# Patient Record
Sex: Female | Born: 1985 | Race: Black or African American | Hispanic: No | Smoking: Never smoker
Health system: Southern US, Community
[De-identification: ages and names within clinical notes are randomized; demographics above are authoritative.]

## PROBLEM LIST (undated history)

## (undated) DIAGNOSIS — F419 Anxiety disorder, unspecified: Secondary | ICD-10-CM

## (undated) DIAGNOSIS — G44009 Cluster headache syndrome, unspecified, not intractable: Secondary | ICD-10-CM

## (undated) DIAGNOSIS — G8929 Other chronic pain: Secondary | ICD-10-CM

## (undated) DIAGNOSIS — N200 Calculus of kidney: Secondary | ICD-10-CM

## (undated) DIAGNOSIS — I10 Essential (primary) hypertension: Secondary | ICD-10-CM

## (undated) DIAGNOSIS — F319 Bipolar disorder, unspecified: Secondary | ICD-10-CM

## (undated) DIAGNOSIS — M549 Dorsalgia, unspecified: Secondary | ICD-10-CM

## (undated) HISTORY — DX: Essential (primary) hypertension: I10

---
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
## 2005-03-27 ENCOUNTER — Ambulatory Visit: Payer: Self-pay | Admitting: Family Medicine

## 2005-07-31 ENCOUNTER — Encounter (INDEPENDENT_AMBULATORY_CARE_PROVIDER_SITE_OTHER): Payer: Self-pay | Admitting: *Deleted

## 2005-07-31 LAB — CONVERTED CEMR LAB

## 2005-08-06 ENCOUNTER — Ambulatory Visit: Payer: Self-pay | Admitting: Family Medicine

## 2005-08-10 ENCOUNTER — Ambulatory Visit: Payer: Self-pay | Admitting: Family Medicine

## 2005-08-30 ENCOUNTER — Ambulatory Visit: Payer: Self-pay | Admitting: Family Medicine

## 2005-08-30 ENCOUNTER — Encounter (INDEPENDENT_AMBULATORY_CARE_PROVIDER_SITE_OTHER): Payer: Self-pay | Admitting: *Deleted

## 2005-09-18 ENCOUNTER — Emergency Department (HOSPITAL_COMMUNITY): Admission: EM | Admit: 2005-09-18 | Discharge: 2005-09-18 | Payer: Self-pay | Admitting: Emergency Medicine

## 2005-09-20 ENCOUNTER — Ambulatory Visit: Payer: Self-pay | Admitting: Family Medicine

## 2005-09-26 ENCOUNTER — Ambulatory Visit: Payer: Self-pay | Admitting: Family Medicine

## 2005-09-28 ENCOUNTER — Ambulatory Visit (HOSPITAL_COMMUNITY): Admission: RE | Admit: 2005-09-28 | Discharge: 2005-09-28 | Payer: Self-pay | Admitting: Family Medicine

## 2005-10-30 ENCOUNTER — Ambulatory Visit: Payer: Self-pay | Admitting: Family Medicine

## 2005-11-06 ENCOUNTER — Ambulatory Visit (HOSPITAL_COMMUNITY): Admission: RE | Admit: 2005-11-06 | Discharge: 2005-11-06 | Payer: Self-pay | Admitting: Family Medicine

## 2005-11-07 ENCOUNTER — Observation Stay (HOSPITAL_COMMUNITY): Admission: AD | Admit: 2005-11-07 | Discharge: 2005-11-09 | Payer: Self-pay | Admitting: Obstetrics & Gynecology

## 2005-11-16 ENCOUNTER — Ambulatory Visit: Payer: Self-pay | Admitting: Family Medicine

## 2005-11-26 ENCOUNTER — Ambulatory Visit (HOSPITAL_COMMUNITY): Admission: RE | Admit: 2005-11-26 | Discharge: 2005-11-26 | Payer: Self-pay | Admitting: Obstetrics and Gynecology

## 2005-11-29 ENCOUNTER — Ambulatory Visit: Payer: Self-pay | Admitting: Sports Medicine

## 2005-12-20 ENCOUNTER — Ambulatory Visit: Payer: Self-pay | Admitting: Family Medicine

## 2005-12-25 ENCOUNTER — Inpatient Hospital Stay (HOSPITAL_COMMUNITY): Admission: AD | Admit: 2005-12-25 | Discharge: 2005-12-25 | Payer: Self-pay | Admitting: Family Medicine

## 2006-01-02 ENCOUNTER — Inpatient Hospital Stay (HOSPITAL_COMMUNITY): Admission: AD | Admit: 2006-01-02 | Discharge: 2006-01-02 | Payer: Self-pay | Admitting: Obstetrics & Gynecology

## 2006-01-02 ENCOUNTER — Ambulatory Visit: Payer: Self-pay | Admitting: Obstetrics and Gynecology

## 2006-01-20 ENCOUNTER — Inpatient Hospital Stay (HOSPITAL_COMMUNITY): Admission: AD | Admit: 2006-01-20 | Discharge: 2006-01-22 | Payer: Self-pay | Admitting: Obstetrics & Gynecology

## 2006-01-30 ENCOUNTER — Ambulatory Visit: Payer: Self-pay | Admitting: *Deleted

## 2006-02-04 ENCOUNTER — Inpatient Hospital Stay (HOSPITAL_COMMUNITY): Admission: AD | Admit: 2006-02-04 | Discharge: 2006-02-05 | Payer: Self-pay | Admitting: Obstetrics and Gynecology

## 2006-02-04 ENCOUNTER — Ambulatory Visit: Payer: Self-pay | Admitting: *Deleted

## 2006-02-05 ENCOUNTER — Inpatient Hospital Stay (HOSPITAL_COMMUNITY): Admission: AD | Admit: 2006-02-05 | Discharge: 2006-02-05 | Payer: Self-pay | Admitting: Family Medicine

## 2006-02-05 ENCOUNTER — Ambulatory Visit: Payer: Self-pay | Admitting: Family Medicine

## 2006-02-10 ENCOUNTER — Ambulatory Visit: Payer: Self-pay | Admitting: Family Medicine

## 2006-02-10 ENCOUNTER — Ambulatory Visit (HOSPITAL_COMMUNITY): Admission: RE | Admit: 2006-02-10 | Discharge: 2006-02-10 | Payer: Self-pay | Admitting: Obstetrics and Gynecology

## 2006-02-10 ENCOUNTER — Inpatient Hospital Stay (HOSPITAL_COMMUNITY): Admission: AD | Admit: 2006-02-10 | Discharge: 2006-02-10 | Payer: Self-pay | Admitting: Obstetrics and Gynecology

## 2006-02-14 ENCOUNTER — Ambulatory Visit: Payer: Self-pay | Admitting: Family Medicine

## 2006-02-21 ENCOUNTER — Ambulatory Visit: Payer: Self-pay | Admitting: *Deleted

## 2006-02-21 ENCOUNTER — Inpatient Hospital Stay (HOSPITAL_COMMUNITY): Admission: AD | Admit: 2006-02-21 | Discharge: 2006-02-21 | Payer: Self-pay | Admitting: Obstetrics & Gynecology

## 2006-02-24 ENCOUNTER — Inpatient Hospital Stay (HOSPITAL_COMMUNITY): Admission: AD | Admit: 2006-02-24 | Discharge: 2006-02-24 | Payer: Self-pay | Admitting: Family Medicine

## 2006-02-24 ENCOUNTER — Ambulatory Visit: Payer: Self-pay | Admitting: Obstetrics and Gynecology

## 2006-02-25 ENCOUNTER — Ambulatory Visit: Payer: Self-pay | Admitting: Obstetrics and Gynecology

## 2006-02-25 ENCOUNTER — Inpatient Hospital Stay (HOSPITAL_COMMUNITY): Admission: AD | Admit: 2006-02-25 | Discharge: 2006-02-25 | Payer: Self-pay | Admitting: Obstetrics and Gynecology

## 2006-02-26 ENCOUNTER — Inpatient Hospital Stay (HOSPITAL_COMMUNITY): Admission: AD | Admit: 2006-02-26 | Discharge: 2006-02-26 | Payer: Self-pay | Admitting: *Deleted

## 2006-02-26 ENCOUNTER — Ambulatory Visit: Payer: Self-pay | Admitting: *Deleted

## 2006-02-28 ENCOUNTER — Ambulatory Visit: Payer: Self-pay | Admitting: Family Medicine

## 2006-03-11 ENCOUNTER — Ambulatory Visit: Payer: Self-pay | Admitting: Obstetrics and Gynecology

## 2006-03-11 ENCOUNTER — Inpatient Hospital Stay (HOSPITAL_COMMUNITY): Admission: AD | Admit: 2006-03-11 | Discharge: 2006-03-11 | Payer: Self-pay | Admitting: Family Medicine

## 2006-03-14 ENCOUNTER — Ambulatory Visit: Payer: Self-pay | Admitting: Gynecology

## 2006-03-15 ENCOUNTER — Inpatient Hospital Stay (HOSPITAL_COMMUNITY): Admission: AD | Admit: 2006-03-15 | Discharge: 2006-03-15 | Payer: Self-pay | Admitting: Family Medicine

## 2006-03-15 ENCOUNTER — Ambulatory Visit: Payer: Self-pay | Admitting: Family Medicine

## 2006-03-18 ENCOUNTER — Inpatient Hospital Stay (HOSPITAL_COMMUNITY): Admission: AD | Admit: 2006-03-18 | Discharge: 2006-03-22 | Payer: Self-pay | Admitting: *Deleted

## 2006-03-18 ENCOUNTER — Ambulatory Visit: Payer: Self-pay | Admitting: Obstetrics & Gynecology

## 2006-03-18 ENCOUNTER — Ambulatory Visit: Payer: Self-pay | Admitting: Obstetrics and Gynecology

## 2006-05-02 ENCOUNTER — Ambulatory Visit: Payer: Self-pay | Admitting: Family Medicine

## 2006-05-28 ENCOUNTER — Emergency Department (HOSPITAL_COMMUNITY): Admission: EM | Admit: 2006-05-28 | Discharge: 2006-05-29 | Payer: Self-pay | Admitting: Emergency Medicine

## 2006-07-29 ENCOUNTER — Emergency Department (HOSPITAL_COMMUNITY): Admission: EM | Admit: 2006-07-29 | Discharge: 2006-07-29 | Payer: Self-pay | Admitting: Emergency Medicine

## 2006-08-07 ENCOUNTER — Ambulatory Visit: Payer: Self-pay | Admitting: Family Medicine

## 2006-08-19 ENCOUNTER — Ambulatory Visit: Payer: Self-pay | Admitting: Family Medicine

## 2006-08-21 ENCOUNTER — Ambulatory Visit: Payer: Self-pay | Admitting: Family Medicine

## 2006-09-02 ENCOUNTER — Emergency Department (HOSPITAL_COMMUNITY): Admission: EM | Admit: 2006-09-02 | Discharge: 2006-09-02 | Payer: Self-pay | Admitting: Emergency Medicine

## 2006-09-10 ENCOUNTER — Observation Stay (HOSPITAL_COMMUNITY): Admission: EM | Admit: 2006-09-10 | Discharge: 2006-09-11 | Payer: Self-pay | Admitting: Emergency Medicine

## 2006-09-10 ENCOUNTER — Ambulatory Visit: Payer: Self-pay | Admitting: Internal Medicine

## 2006-11-29 ENCOUNTER — Ambulatory Visit: Payer: Self-pay | Admitting: Family Medicine

## 2006-12-05 ENCOUNTER — Emergency Department (HOSPITAL_COMMUNITY): Admission: EM | Admit: 2006-12-05 | Discharge: 2006-12-06 | Payer: Self-pay | Admitting: Emergency Medicine

## 2006-12-06 ENCOUNTER — Ambulatory Visit: Payer: Self-pay | Admitting: Family Medicine

## 2006-12-12 ENCOUNTER — Observation Stay (HOSPITAL_COMMUNITY): Admission: EM | Admit: 2006-12-12 | Discharge: 2006-12-15 | Payer: Self-pay | Admitting: Emergency Medicine

## 2006-12-18 ENCOUNTER — Emergency Department (HOSPITAL_COMMUNITY): Admission: EM | Admit: 2006-12-18 | Discharge: 2006-12-19 | Payer: Self-pay | Admitting: Emergency Medicine

## 2007-01-13 ENCOUNTER — Encounter: Payer: Self-pay | Admitting: Family Medicine

## 2007-01-13 ENCOUNTER — Ambulatory Visit: Payer: Self-pay | Admitting: Sports Medicine

## 2007-01-13 LAB — CONVERTED CEMR LAB
Antibody Screen: NEGATIVE
Basophils Absolute: 0 10*3/uL (ref 0.0–0.1)
Basophils Relative: 0 % (ref 0–1)
HCT: 37.7 % (ref 36.0–46.0)
Hemoglobin: 12.6 g/dL (ref 12.0–15.0)
Lymphs Abs: 1.9 10*3/uL (ref 0.7–3.3)
Monocytes Absolute: 0.3 10*3/uL (ref 0.2–0.7)
Neutro Abs: 6.6 10*3/uL (ref 1.7–7.7)
Rubella: 59.3 intl units/mL — ABNORMAL HIGH

## 2007-01-14 ENCOUNTER — Encounter: Payer: Self-pay | Admitting: Family Medicine

## 2007-01-18 ENCOUNTER — Emergency Department (HOSPITAL_COMMUNITY): Admission: EM | Admit: 2007-01-18 | Discharge: 2007-01-18 | Payer: Self-pay | Admitting: Emergency Medicine

## 2007-01-19 ENCOUNTER — Emergency Department (HOSPITAL_COMMUNITY): Admission: EM | Admit: 2007-01-19 | Discharge: 2007-01-20 | Payer: Self-pay | Admitting: Emergency Medicine

## 2007-02-27 DIAGNOSIS — F411 Generalized anxiety disorder: Secondary | ICD-10-CM | POA: Insufficient documentation

## 2007-02-27 DIAGNOSIS — D509 Iron deficiency anemia, unspecified: Secondary | ICD-10-CM | POA: Insufficient documentation

## 2007-02-28 ENCOUNTER — Encounter (INDEPENDENT_AMBULATORY_CARE_PROVIDER_SITE_OTHER): Payer: Self-pay | Admitting: *Deleted

## 2007-07-15 ENCOUNTER — Telehealth: Payer: Self-pay | Admitting: *Deleted

## 2007-07-16 ENCOUNTER — Telehealth (INDEPENDENT_AMBULATORY_CARE_PROVIDER_SITE_OTHER): Payer: Self-pay | Admitting: Family Medicine

## 2007-07-16 ENCOUNTER — Ambulatory Visit: Payer: Self-pay | Admitting: Family Medicine

## 2007-07-17 ENCOUNTER — Telehealth: Payer: Self-pay | Admitting: *Deleted

## 2007-08-06 ENCOUNTER — Emergency Department (HOSPITAL_COMMUNITY): Admission: EM | Admit: 2007-08-06 | Discharge: 2007-08-06 | Payer: Self-pay | Admitting: Emergency Medicine

## 2007-10-26 ENCOUNTER — Emergency Department (HOSPITAL_COMMUNITY): Admission: EM | Admit: 2007-10-26 | Discharge: 2007-10-26 | Payer: Self-pay | Admitting: Emergency Medicine

## 2007-11-24 ENCOUNTER — Encounter (INDEPENDENT_AMBULATORY_CARE_PROVIDER_SITE_OTHER): Payer: Self-pay | Admitting: Family Medicine

## 2007-11-24 ENCOUNTER — Ambulatory Visit: Payer: Self-pay | Admitting: Family Medicine

## 2007-11-24 LAB — CONVERTED CEMR LAB

## 2007-11-25 ENCOUNTER — Emergency Department (HOSPITAL_COMMUNITY): Admission: EM | Admit: 2007-11-25 | Discharge: 2007-11-26 | Payer: Self-pay | Admitting: Emergency Medicine

## 2007-11-25 ENCOUNTER — Encounter (INDEPENDENT_AMBULATORY_CARE_PROVIDER_SITE_OTHER): Payer: Self-pay | Admitting: Family Medicine

## 2007-12-10 ENCOUNTER — Telehealth: Payer: Self-pay | Admitting: *Deleted

## 2007-12-22 ENCOUNTER — Ambulatory Visit: Payer: Self-pay | Admitting: Sports Medicine

## 2007-12-22 LAB — CONVERTED CEMR LAB: Beta hcg, urine, semiquantitative: NEGATIVE

## 2008-03-19 ENCOUNTER — Ambulatory Visit: Payer: Self-pay | Admitting: Family Medicine

## 2008-06-11 ENCOUNTER — Telehealth: Payer: Self-pay | Admitting: *Deleted

## 2008-06-21 ENCOUNTER — Ambulatory Visit: Payer: Self-pay | Admitting: Family Medicine

## 2008-07-20 ENCOUNTER — Encounter: Payer: Self-pay | Admitting: *Deleted

## 2008-09-01 ENCOUNTER — Telehealth: Payer: Self-pay | Admitting: *Deleted

## 2008-09-28 ENCOUNTER — Ambulatory Visit: Payer: Self-pay | Admitting: Family Medicine

## 2008-10-29 ENCOUNTER — Encounter: Payer: Self-pay | Admitting: *Deleted

## 2009-01-06 ENCOUNTER — Ambulatory Visit: Payer: Self-pay | Admitting: Family Medicine

## 2009-02-16 ENCOUNTER — Emergency Department (HOSPITAL_COMMUNITY): Admission: EM | Admit: 2009-02-16 | Discharge: 2009-02-16 | Payer: Self-pay | Admitting: Emergency Medicine

## 2009-04-11 ENCOUNTER — Emergency Department (HOSPITAL_COMMUNITY): Admission: EM | Admit: 2009-04-11 | Discharge: 2009-04-11 | Payer: Self-pay | Admitting: Emergency Medicine

## 2009-04-18 ENCOUNTER — Ambulatory Visit: Payer: Self-pay | Admitting: Family Medicine

## 2009-04-18 ENCOUNTER — Telehealth: Payer: Self-pay | Admitting: Family Medicine

## 2009-04-18 LAB — CONVERTED CEMR LAB: Beta hcg, urine, semiquantitative: NEGATIVE

## 2009-08-04 ENCOUNTER — Ambulatory Visit: Payer: Self-pay | Admitting: Family Medicine

## 2009-12-01 ENCOUNTER — Emergency Department (HOSPITAL_COMMUNITY): Admission: EM | Admit: 2009-12-01 | Discharge: 2009-12-01 | Payer: Self-pay | Admitting: Emergency Medicine

## 2010-01-02 ENCOUNTER — Emergency Department (HOSPITAL_COMMUNITY): Admission: EM | Admit: 2010-01-02 | Discharge: 2010-01-02 | Payer: Self-pay | Admitting: Emergency Medicine

## 2010-01-14 ENCOUNTER — Telehealth: Payer: Self-pay | Admitting: Family Medicine

## 2010-01-16 ENCOUNTER — Emergency Department (HOSPITAL_COMMUNITY): Admission: EM | Admit: 2010-01-16 | Discharge: 2010-01-16 | Payer: Self-pay | Admitting: Emergency Medicine

## 2010-02-06 ENCOUNTER — Telehealth: Payer: Self-pay | Admitting: Family Medicine

## 2010-02-09 ENCOUNTER — Emergency Department (HOSPITAL_COMMUNITY): Admission: EM | Admit: 2010-02-09 | Discharge: 2010-02-09 | Payer: Self-pay | Admitting: Emergency Medicine

## 2010-02-10 ENCOUNTER — Ambulatory Visit (HOSPITAL_COMMUNITY): Admission: RE | Admit: 2010-02-10 | Discharge: 2010-02-10 | Payer: Self-pay | Admitting: Emergency Medicine

## 2010-04-06 ENCOUNTER — Emergency Department (HOSPITAL_COMMUNITY): Admission: EM | Admit: 2010-04-06 | Discharge: 2010-04-06 | Payer: Self-pay | Admitting: Emergency Medicine

## 2010-04-20 ENCOUNTER — Encounter: Payer: Self-pay | Admitting: Family Medicine

## 2010-04-20 ENCOUNTER — Ambulatory Visit: Payer: Self-pay | Admitting: Family Medicine

## 2010-04-20 LAB — CONVERTED CEMR LAB: Beta hcg, urine, semiquantitative: POSITIVE

## 2010-04-24 LAB — CONVERTED CEMR LAB
Antibody Screen: NEGATIVE
Eosinophils Absolute: 0 10*3/uL (ref 0.0–0.7)
Eosinophils Relative: 1 % (ref 0–5)
Lymphs Abs: 1.3 10*3/uL (ref 0.7–4.0)
Monocytes Absolute: 0.4 10*3/uL (ref 0.1–1.0)
Neutro Abs: 7 10*3/uL (ref 1.7–7.7)
RDW: 12.5 % (ref 11.5–15.5)
Rh Type: POSITIVE
WBC: 8.7 10*3/uL (ref 4.0–10.5)

## 2010-04-25 ENCOUNTER — Emergency Department (HOSPITAL_COMMUNITY): Admission: EM | Admit: 2010-04-25 | Discharge: 2010-04-25 | Payer: Self-pay | Admitting: Diagnostic Radiology

## 2010-04-30 ENCOUNTER — Encounter: Payer: Self-pay | Admitting: Family Medicine

## 2010-05-01 ENCOUNTER — Encounter: Payer: Self-pay | Admitting: Family Medicine

## 2010-05-01 ENCOUNTER — Ambulatory Visit: Payer: Self-pay | Admitting: Family Medicine

## 2010-05-01 LAB — CONVERTED CEMR LAB: Pap Smear: NEGATIVE

## 2010-05-02 ENCOUNTER — Encounter: Payer: Self-pay | Admitting: Family Medicine

## 2010-05-02 LAB — CONVERTED CEMR LAB
Chlamydia, DNA Probe: NEGATIVE
GC Probe Amp, Genital: NEGATIVE

## 2010-05-16 ENCOUNTER — Emergency Department (HOSPITAL_COMMUNITY): Admission: EM | Admit: 2010-05-16 | Discharge: 2010-05-17 | Payer: Self-pay | Admitting: Emergency Medicine

## 2010-05-16 ENCOUNTER — Encounter: Payer: Self-pay | Admitting: Family Medicine

## 2010-05-17 ENCOUNTER — Telehealth: Payer: Self-pay | Admitting: Family Medicine

## 2010-05-18 ENCOUNTER — Telehealth: Payer: Self-pay | Admitting: Family Medicine

## 2010-05-18 ENCOUNTER — Emergency Department (HOSPITAL_COMMUNITY): Admission: EM | Admit: 2010-05-18 | Discharge: 2010-05-18 | Payer: Self-pay | Admitting: Emergency Medicine

## 2010-06-01 ENCOUNTER — Emergency Department (HOSPITAL_COMMUNITY): Admission: EM | Admit: 2010-06-01 | Discharge: 2010-06-01 | Payer: Self-pay | Admitting: Emergency Medicine

## 2010-06-01 ENCOUNTER — Encounter: Payer: Self-pay | Admitting: Family Medicine

## 2010-06-07 ENCOUNTER — Encounter: Payer: Self-pay | Admitting: *Deleted

## 2010-06-15 ENCOUNTER — Emergency Department (HOSPITAL_COMMUNITY): Admission: EM | Admit: 2010-06-15 | Discharge: 2010-06-16 | Payer: Self-pay | Admitting: Emergency Medicine

## 2010-06-15 ENCOUNTER — Telehealth: Payer: Self-pay | Admitting: *Deleted

## 2010-06-16 ENCOUNTER — Telehealth (INDEPENDENT_AMBULATORY_CARE_PROVIDER_SITE_OTHER): Payer: Self-pay | Admitting: *Deleted

## 2010-06-19 ENCOUNTER — Telehealth: Payer: Self-pay | Admitting: Family Medicine

## 2010-07-06 ENCOUNTER — Ambulatory Visit: Payer: Self-pay | Admitting: Family Medicine

## 2010-07-11 ENCOUNTER — Emergency Department (HOSPITAL_COMMUNITY): Admission: EM | Admit: 2010-07-11 | Discharge: 2010-07-11 | Payer: Self-pay | Admitting: Emergency Medicine

## 2010-07-11 ENCOUNTER — Telehealth: Payer: Self-pay | Admitting: Family Medicine

## 2010-07-21 ENCOUNTER — Ambulatory Visit (HOSPITAL_COMMUNITY): Admission: RE | Admit: 2010-07-21 | Discharge: 2010-07-21 | Payer: Self-pay | Admitting: Internal Medicine

## 2010-07-21 ENCOUNTER — Encounter: Payer: Self-pay | Admitting: Family Medicine

## 2010-07-27 ENCOUNTER — Telehealth: Payer: Self-pay | Admitting: Family Medicine

## 2010-08-10 ENCOUNTER — Emergency Department (HOSPITAL_COMMUNITY): Admission: EM | Admit: 2010-08-10 | Discharge: 2010-08-10 | Payer: Self-pay | Admitting: Emergency Medicine

## 2010-08-16 ENCOUNTER — Ambulatory Visit: Payer: Self-pay | Admitting: Family Medicine

## 2010-08-29 ENCOUNTER — Ambulatory Visit: Payer: Self-pay | Admitting: Family Medicine

## 2010-08-29 ENCOUNTER — Encounter: Payer: Self-pay | Admitting: Family Medicine

## 2010-09-08 ENCOUNTER — Telehealth: Payer: Self-pay | Admitting: Family Medicine

## 2010-09-12 ENCOUNTER — Encounter: Payer: Self-pay | Admitting: Family Medicine

## 2010-09-12 ENCOUNTER — Ambulatory Visit: Payer: Self-pay | Admitting: Family Medicine

## 2010-09-13 ENCOUNTER — Encounter: Payer: Self-pay | Admitting: Family Medicine

## 2010-09-13 ENCOUNTER — Encounter: Payer: Self-pay | Admitting: *Deleted

## 2010-10-03 ENCOUNTER — Encounter: Payer: Self-pay | Admitting: Family Medicine

## 2010-10-19 ENCOUNTER — Encounter: Payer: Self-pay | Admitting: Family Medicine

## 2010-10-19 ENCOUNTER — Ambulatory Visit: Payer: Self-pay | Admitting: Family Medicine

## 2010-10-19 LAB — CONVERTED CEMR LAB
HCT: 29.3 % — ABNORMAL LOW (ref 36.0–46.0)
Hemoglobin: 10.1 g/dL — ABNORMAL LOW (ref 12.0–15.0)
MCV: 88 fL (ref 78.0–100.0)
Platelets: 325 10*3/uL (ref 150–400)
RDW: 13.3 % (ref 11.5–15.5)
WBC: 10.1 10*3/uL (ref 4.0–10.5)

## 2010-10-22 ENCOUNTER — Telehealth: Payer: Self-pay | Admitting: Family Medicine

## 2010-10-30 ENCOUNTER — Telehealth: Payer: Self-pay | Admitting: Family Medicine

## 2010-11-02 ENCOUNTER — Ambulatory Visit: Payer: Self-pay | Admitting: Family Medicine

## 2010-11-02 ENCOUNTER — Encounter: Payer: Self-pay | Admitting: Family Medicine

## 2010-11-09 ENCOUNTER — Emergency Department (HOSPITAL_COMMUNITY): Admission: EM | Admit: 2010-11-09 | Discharge: 2010-11-09 | Payer: Self-pay | Admitting: Emergency Medicine

## 2010-11-14 ENCOUNTER — Encounter: Payer: Self-pay | Admitting: Family Medicine

## 2010-11-14 ENCOUNTER — Ambulatory Visit: Payer: Self-pay | Admitting: Family Medicine

## 2010-11-14 DIAGNOSIS — N760 Acute vaginitis: Secondary | ICD-10-CM | POA: Insufficient documentation

## 2010-11-15 ENCOUNTER — Encounter: Payer: Self-pay | Admitting: Family Medicine

## 2010-11-15 LAB — CONVERTED CEMR LAB: GC Probe Amp, Genital: NEGATIVE

## 2010-11-18 ENCOUNTER — Telehealth: Payer: Self-pay | Admitting: Family Medicine

## 2010-11-19 ENCOUNTER — Inpatient Hospital Stay (HOSPITAL_COMMUNITY)
Admission: AD | Admit: 2010-11-19 | Discharge: 2010-11-19 | Payer: Self-pay | Source: Home / Self Care | Admitting: Obstetrics & Gynecology

## 2010-11-20 ENCOUNTER — Inpatient Hospital Stay (HOSPITAL_COMMUNITY)
Admission: AD | Admit: 2010-11-20 | Discharge: 2010-11-20 | Payer: Self-pay | Source: Home / Self Care | Admitting: Obstetrics & Gynecology

## 2010-11-20 ENCOUNTER — Telehealth: Payer: Self-pay | Admitting: *Deleted

## 2010-11-21 ENCOUNTER — Encounter: Payer: Self-pay | Admitting: Family Medicine

## 2010-11-22 ENCOUNTER — Ambulatory Visit: Payer: Self-pay | Admitting: Family Medicine

## 2010-11-24 ENCOUNTER — Inpatient Hospital Stay (HOSPITAL_COMMUNITY)
Admission: AD | Admit: 2010-11-24 | Discharge: 2010-11-24 | Payer: Self-pay | Source: Home / Self Care | Admitting: Obstetrics and Gynecology

## 2010-11-29 ENCOUNTER — Ambulatory Visit: Payer: Self-pay | Admitting: Family Medicine

## 2010-12-01 ENCOUNTER — Inpatient Hospital Stay (HOSPITAL_COMMUNITY)
Admission: AD | Admit: 2010-12-01 | Discharge: 2010-12-05 | Payer: Self-pay | Source: Home / Self Care | Admitting: Obstetrics & Gynecology

## 2010-12-01 ENCOUNTER — Telehealth (INDEPENDENT_AMBULATORY_CARE_PROVIDER_SITE_OTHER): Payer: Self-pay | Admitting: *Deleted

## 2010-12-02 ENCOUNTER — Encounter: Payer: Self-pay | Admitting: Obstetrics & Gynecology

## 2010-12-05 ENCOUNTER — Encounter: Payer: Self-pay | Admitting: Family Medicine

## 2010-12-06 ENCOUNTER — Ambulatory Visit: Payer: Self-pay

## 2010-12-07 ENCOUNTER — Ambulatory Visit: Payer: Self-pay | Admitting: Family Medicine

## 2010-12-07 DIAGNOSIS — I10 Essential (primary) hypertension: Secondary | ICD-10-CM

## 2010-12-07 HISTORY — DX: Essential (primary) hypertension: I10

## 2010-12-11 ENCOUNTER — Telehealth: Payer: Self-pay | Admitting: Family Medicine

## 2010-12-12 ENCOUNTER — Telehealth: Payer: Self-pay | Admitting: *Deleted

## 2010-12-20 ENCOUNTER — Encounter: Payer: Self-pay | Admitting: Family Medicine

## 2010-12-20 ENCOUNTER — Ambulatory Visit: Payer: Self-pay | Admitting: Family Medicine

## 2010-12-20 DIAGNOSIS — R300 Dysuria: Secondary | ICD-10-CM | POA: Insufficient documentation

## 2010-12-20 LAB — CONVERTED CEMR LAB
Ketones, urine, test strip: NEGATIVE
Nitrite: NEGATIVE
Protein, U semiquant: NEGATIVE
Urobilinogen, UA: 0.2

## 2011-01-01 ENCOUNTER — Encounter: Payer: Self-pay | Admitting: Family Medicine

## 2011-01-11 ENCOUNTER — Telehealth: Payer: Self-pay | Admitting: Family Medicine

## 2011-01-15 ENCOUNTER — Ambulatory Visit: Admission: RE | Admit: 2011-01-15 | Discharge: 2011-01-15 | Payer: Self-pay | Source: Home / Self Care

## 2011-01-15 ENCOUNTER — Encounter: Payer: Self-pay | Admitting: Family Medicine

## 2011-01-15 DIAGNOSIS — F3181 Bipolar II disorder: Secondary | ICD-10-CM | POA: Insufficient documentation

## 2011-01-15 DIAGNOSIS — N949 Unspecified condition associated with female genital organs and menstrual cycle: Secondary | ICD-10-CM | POA: Insufficient documentation

## 2011-01-22 ENCOUNTER — Ambulatory Visit: Admission: RE | Admit: 2011-01-22 | Discharge: 2011-01-22 | Payer: Self-pay | Source: Home / Self Care

## 2011-01-22 LAB — CONVERTED CEMR LAB: Beta hcg, urine, semiquantitative: NEGATIVE

## 2011-02-01 NOTE — Progress Notes (Signed)
Summary: phn msg   Phone Note Call from Patient Call back at Work Phone [phone]   Caller: Patient Summary of Call: would like to speak to Dr Alvester Morin - has a question for him Initial call taken by: De Nurse,  May 18, 2010 2:31 PM  Follow-up for Phone Call        Spoke w/ pt over the phone. Pt was suppose to followup w. me today in clinic. However, could not get off work in [place]. Pt states that she was seen at [facility] 05/16/10 for evalaution of lower back pain, given rx for kelfex for UTI. Of note Pt w/ urine cx + >100k E.coli 5/2 in clinc -cephalosporin sensitive-placed on 7 day course of keflex. Pt states that L back/flank pain and dysuria persistent despite Abx. Pt feels like sxs worse. + nausea. Can only tolerate water, eating food ok. Subjective fevers and chills. No hematuria. + Abd cramping. No vaginal bleeding/discharge. Instruced pt to go to ED for evaulation. (Pt may need IV abx for pyelonephritis vs. inevitable Ab.) [name] May 18, 2010 4:39 PM

## 2011-02-01 NOTE — Assessment & Plan Note (Signed)
Summary: ob visit,tcb   Vital Signs:  Patient profile:   25 year old female Weight:      131.6 pounds BMI:     21.32 Temp:     98.4 degrees F oral Pulse rate:   99 / minute BP sitting:   114 / 63  (left arm) Cuff size:   regular  Vitals Entered By: Jimmy Footman, CMA (July 06, 2010 2:11 PM) CC: ob visit (16 wks) Is Patient Diabetic? No Pain Assessment Patient in pain? no      LMP (date): 03/10/2010 EDC by LMP==> 12/15/2010 [facility] 12/15/2010   Primary Care Provider:  Delbert Harness MD  CC:  ob visit (16 wks).  History of Present Illness: 25 YO [id number] here at 16 6/7 weeks by LMP w/ EDD @ 12/15/10 here for prenatal visit. Pt denies HA, abd pain, vaginal bleeding, vaginal discharge. + Fetal activity per pt. No dysuria/back pain per pt. Nausea still presnt 1-2 per week, but has greatly improved since beginning of second trimester per pt. No tobacco/substance abuse per pt. Pt states that she has been concerned about her appearance during this pregnancy and feeling very emotional. No HI/SI per pt. Just feels very aware of her body image.   Habits & Providers  Alcohol-Tobacco-Diet     Tobacco Status: never     Cigarette Packs/Day: n/a  Allergies: No Known Drug Allergies  Social History: Packs/Day:  n/a  Physical Exam  General:  alert, well-developed, and healthy-appearing.   Head:  normocephalic and atraumatic.   Eyes:  vision grossly intact.   Ears:  R ear normal and no external deformities.   Nose:  no external deformity.   Mouth:  good dentition.   Neck:  supple and full ROM.   Lungs:  normal respiratory effort and no wheezes.   Heart:  normal rate, regular rhythm, and no murmur.   Abdomen:  gravid abdomen, measuring to 17.5 cm, Fetal Heart Tones in 140s Extremities:  2+ peripheral pulses no edema.    Impression & Recommendations:  Problem # 1:  PREGNANCY, MULTIGRAVIDA (ICD-V22.1) Otherwise noraml development oand growth of fetus to date. Pt denying integrated and  quad screening. PHQ-9 score 8 today. However, in setting of recent plans for abortion, increased concerns about psychosocila situation. FOB present in clinic today. Unable to fully assess recent events. Plan to perform followup call w/ pt to further assess. Also plan to contact [employer] contact to setup home resources for pt. Weight gain approriate to date. Also plan to setup pt for 18-20 week u/s.  Orders: Prenatal U/S > 14 weeks - 81191 (Prenatal U/S) [facility]- Est Level  3 (47829)  Complete Medication List: 1)  Prenatal Vitamins  .... 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 test: 04/17/2010   Flowsheet View for Follow-up Visit    Estimated weeks of       gestation:     16 6/7    Weight:     131.6    Blood pressure:   114 / 63    Headache:     No    Nausea/vomiting:   nausea    Edema:     0    Vaginal bleeding:   no    Vaginal discharge:   no    Fundal height:       17.5    FHR:       140s    Fetal activity:     yes    Labor symptoms:   no    Taking prenatal vits?   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 test: 04/17/2010   Appended Document: ob visit,tcb Spoke w/pt over the phone 7/10 about social situation. Pt states that she was very financilally concerned about having new child. However pt talked with her and FOBs parents at length and now feels that they will be able to support baby. Pt feels much more comfortable about new child and has no wishes for abortion. Told pt that I would Biomedical engineer to setup for resources. Pt agreeable. Lori Albee MD {DATETIMESTAMP()}

## 2011-02-01 NOTE — Progress Notes (Signed)
   Phone Note Call from Patient   Caller: Patient Summary of Call: last depot shot in early august.  having irregular bleeding recently.  took home preg test last week, which was negative.  she wonders if anything could be wrong.  she is sexually active not currently using birth control.  advised that irregular bleeding probably side effect from depo, but that she should recheck pregnancy test in a few weeks.  advised she is at risk for becoming pregnant and should use birth control if she does not want to become pregant.  told her she can always come in the office for pregnancy test.   Initial call taken by: Asher Muir MD,  January 14, 2010 9:34 PM

## 2011-02-01 NOTE — Progress Notes (Signed)
Summary: OB pt needing dental work   Phone Note Call from Patient Call back at [phone]   Reason for Call: Talk to Nurse Summary of Call: pt is [redacted] weeks pregnant & having terrible tooth pain, was told she needed permission to have the dental work done Initial call taken by: Knox Royalty,  July 11, 2010 1:54 PM  Follow-up for Phone Call        she is crying with pain . unable to get here due to no ride. she lives in [place]. told her to go to an UC where she lives since she cannot get here. she agreed Follow-up by: Golden Circle RN,  July 11, 2010 2:18 PM

## 2011-02-01 NOTE — Letter (Signed)
Summary: PHQ-9  PHQ-9   Imported By: De Nurse 01/15/2011 15:48:53  _____________________________________________________________________  External Attachment:    Type:   Image     Comment:   External Document

## 2011-02-01 NOTE — Letter (Signed)
Summary: Handout Printed  Printed Handout:  - Prenatal-Flowsheet-[facility] 

## 2011-02-01 NOTE — Progress Notes (Signed)
Summary: Emergency line: Question on contraction    Phone Note Other Incoming   Caller: Patient Summary of Call: Pt is G2P0101 @ 32 wga called because she would like to know the regular pattern of contractions for labor.  Her 1st pregnancy was IOL at 59 wga for preeclampsia so she never experienced labor.  We discussed regular pattern of ctx q5 min for being in labor.  We discussed red flags (gush of fluid, vaginal bleeding, decreased fetal movements) for going to MAU.  Pt did not have anymore questions.  She has appt on 11/3 for [facility] OB clinic.  Advised she keep this appt.  Pt agreed.  Initial call taken by: Detta Mellin MD,  October 22, 2010 8:40 PM

## 2011-02-01 NOTE — Assessment & Plan Note (Signed)
Summary: BP CHECK/KH   In office for BP check. BP checked manually using regular adult cuff. BP LA  100/68, RA 98/68. pulse 88. she started HCTZ yesterday and took her first tablet at 4:00 PM. swelling continues in feet as previously according to patient . swelling is noted now to be approx 1+ with no pitting.  patient denies any weakness or dizziness . no headaches or visual disturbancs.. paged Dr. Alvester Morin and he advises to continue with HCTZ as prescribed . call back if any of the above symptoms. If BP is less than  90/70 will stop HCTZ.  advised to follow up on 6 weeks for PP visit. Theresia Lo RN  December 07, 2010 2:08 PM  Nurse Visit   Allergies: No Known Drug Allergies  Orders Added: 1)  No Charge Patient Arrived (NCPA0) [NCPA0]

## 2011-02-01 NOTE — Progress Notes (Signed)
Summary: triage   Phone Note Call from Patient Call back at [phone]   Caller: Patient Summary of Call: Pt is [redacted] weeks pregnant and wondering what she can take for a cold. Initial call taken by: Clydell Hakim,  September 08, 2010 10:00 AM  Follow-up for Phone Call        started yesterday. sneezing. suggested claritin or zyrtec, breathe right strips, tylenol, vicks . she will try some of these. she asked if ahe was going to have the glucola at Tuesday's visit. told her yes. she wanted to know if she could leave & if she had to be back at exactly 1 hour mark. told her yes to both Follow-up by: Golden Circle RN,  September 08, 2010 10:05 AM

## 2011-02-01 NOTE — Assessment & Plan Note (Signed)
Summary: meds concerns/[place]   Vital Signs:  Patient profile:   25 year old female Height:      66 inches Weight:      140.6 pounds BMI:     22.78 Temp:     98.4 degrees F Pulse rate:   83 / minute BP sitting:   103 / 60  Vitals Entered By: Golden Circle RN (August 29, 2010 10:20 AM) CC: f/u meds Is Patient Diabetic? No   Primary Care Provider:  Delbert Harness MD  CC:  f/u meds.  History of Present Illness: Follow up anxiety: Since starting prozac 2 weeks ago Lori Huerta has done well. Had 1 panic attack in the interum which is a decrease from her baseline. Feels well otherwise. No HA, VS, N/V/D contractions or bleeding. + FM. No other issues.   Habits & Providers  Alcohol-Tobacco-Diet     Tobacco Status: never  Current Problems (verified): 1)  Pregnancy, Multigravida  (ICD-V22.1) 2)  Preventive Health Care  (ICD-V70.0) 3)  Contraceptive Management  (ICD-V25.09) 4)  Dyspareunia  (ICD-625.0) 5)  Sexually Transmitted Disease, Exposure To  (ICD-V01.6) 6)  Screening For Malignant Neoplasm of The Cervix  (ICD-V76.2) 7)  Vaginal Bleeding, Abnormal  (ICD-626.9) 8)  Vaginal Discharge  (ICD-623.5) 9)  Rhinitis, Allergic  (ICD-477.9) 10)  Hemorrhoids, Nos  (ICD-455.6) 11)  Anxiety  (ICD-300.00) 12)  Anemia, Iron Deficiency, Unspec.  (ICD-280.9)  Current Medications (verified): 1)  Prenatal Vitamins .... One Tablet By Mouth Daily 2)  Prozac 20 Mg Caps (Fluoxetine Hcl) .... Take 1 Tablet Daily  Allergies (verified): No Known Drug Allergies  Past History:  Past Medical History: Last updated: 04/20/2010 24 h. admission:[place] 11/06:  Nephrolithiasis R, Elective  surgical Abortion 8/07,  Hgb 7.6 --->10.2 post 2units prbc`s in 9/07 pre-eclampsia- focep delivery 03/07 depression as an adolescent  Review of Systems  The patient denies anorexia, weight loss, syncope, headaches, abdominal pain, genital sores, suspicious skin lesions, difficulty walking, and depression.     Physical Exam  General:  Vs noted.  Well appearing woman in NAD Psych:  Cognition and judgment appear intact. Alert and cooperative with normal attention span and concentration. No apparent delusions, illusions, hallucinations   Impression & Recommendations:  Problem # 1:  ANXIETY (ICD-300.00) Assessment Improved  Doing well in the interum. Will f/u with PCP for 26 week check in 2 weeks.  Red flags reviewed. Pt voices understanding.  Her updated medication list for this problem includes:    Prozac 20 Mg Caps (Fluoxetine hcl) .Marland Kitchen... Take 1 tablet daily  Orders: [facility]- Est Level  3 (65784)  Complete Medication List: 1)  Prenatal Vitamins  .... One tablet by mouth daily 2)  Prozac 20 Mg Caps (Fluoxetine hcl) .... Take 1 tablet daily

## 2011-02-01 NOTE — Assessment & Plan Note (Signed)
Summary: Upreg positive- [id number] at 5 5/7wks   Vital Signs:  Patient profile:   25 year old female LMP:     03/10/2010 Height:      66 inches Weight:      122.4 pounds BMI:     19.83 Temp:     98.2 degrees F oral Pulse rate:   89 / minute Pulse rhythm:   regular BP sitting:   114 / 79  (left arm) Cuff size:   regular  Vitals Entered By: Loralee Pacas CMA (April 20, 2010 10:07 AM)  Primary Care Provider:  Delbert Harness MD  CC:  pregnant?.  History of Present Illness: 25yo F here to confirm pregnancy  Pregnancy?: States that she had 2 positive Upreg tests at home.  LMP 03/10/2010.  Has not had depo since Aug 2010.  Previously [id number] with hx of miscarriage and abortion.  Denies any abd pain, vag bleeding, or discharge.  No fevers or chills.  FOB is Esperanza Sheets and plans to be involved.    Current Medications (verified): 1)  Prenatal Vitamins .... One Tablet By Mouth Daily  Allergies (verified): No Known Drug Allergies  Past History:  Past Medical History: 24 h. admission: 11/06:  Nephrolithiasis R, Elective  surgical Abortion 8/07,  Hgb 7.6 --->10.2 post 2units prbc`s in 9/07 pre-eclampsia- focep delivery 03/07 depression as an adolescent  Past Surgical History: Reviewed history from 02/27/2007 and no changes required. Renal U/S 11/06: 6mm nonobstructive stone in R ureteropelvic junction. - 11/27/2005  Review of Systems      See HPI  Physical Exam  General:  VS Reviewed. Well appearing, NAD.  Lungs:  Normal respiratory effort, chest expands symmetrically. Lungs are clear to auscultation, no crackles or wheezes. Heart:  Normal rate and regular rhythm. S1 and S2 normal without gallop, murmur, click, rub or other extra sounds. Abdomen:  Soft, NT, ND, no HSM, active BS  Extremities:  no edema   Impression & Recommendations:  Problem # 1:  PREGNANCY, MULTIGRAVIDA (ICD-V22.1) Assessment New Confirmed w/ positive Upreg in office today. [id number] at 5  5/7 wks per LMP (03/10/2010). EDD: 12/15/2010 Plan to obtain prenatal labs today with instructions to start prenatal vitamins. Will have her f/u with PCP in 1-2 wks. Pt has hx of possible pre-eclampsia...high risk ob???  Orders: Prenatal-[facility] ([id number]) HIV-[facility] ([id number]) Sickle Cell Scr-[facility] ([id number]) [facility]- Est  Level 4 (99214)Future Orders: Urine Culture-[facility] ([id number]) ... 04/18/2011  Complete Medication List: 1)  Prenatal Vitamins  .... One tablet by mouth daily  Other Orders: U Preg-[facility] ([id number])  Patient Instructions: 1)  Please set up an initial OB appt with your primary doctor in the next 1-2 weeks. 2)  Start taking the prenatal vitamins daily. 3)  Congratulations on the pregnancy.  Laboratory Results   Urine Tests  Date/Time Received: April 20, 2010 10:11 AM  Date/Time Reported: April 20, 2010 10:17 AM     Urine HCG: positive Comments: ...............test performed by......Marland KitchenBonnie A. [place], MLS (ASCP)cm

## 2011-02-01 NOTE — Assessment & Plan Note (Signed)
Summary: still bleeding 6 wk after birth/[place]   Vital Signs:  Patient profile:   25 year old female Height:      66 inches Weight:      142 pounds BMI:     23.00 Temp:     98.6 degrees F oral Pulse rate:   76 / minute BP sitting:   116 / 71  (right arm) Cuff size:   regular  Vitals Entered By: Tessie Fass CMA (January 15, 2011 9:05 AM)  Primary Care Provider:  [name] MD  CC:  postpartal bleeding.  History of Present Illness: 25 y/o G2P2 F presents for postpartal bleeding.  She delivered vaginally on December 02, 2010 Still bleeding.  Using pantiliner two times a day.  Bright red blood.   She thinks that bleeding has decreased in amt but is concerned about the bright red color of blood.   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  She does not feel more tired than normal.  No syncope.  No chest pain.  Fatigue is normal (beign post partum) per patient. Prenatal course was uncomplicated.  She took both PNV and Fe during pregnancy.  She is not breast feeding, so she stopped taking PNV.   She was given Depo injection prior to discharge from [facility].   After her first delivery, she bled for about 1-2 wks.  She also received Depo during that delivery.   No fever/chills, abd pain, some dysuria but improving, BM normal.     Post partal: Pt feels overwhelmed.  She has a 25 y/o boy at home and it has been difficult taking care of new baby and her son.  She states that she does not have relatives (mom/sister) to help her, but FOB is home most of the day to help her.  Pt just started going back to work on Sat and Sun and was very tired. She denies SI/HI.  She states that she was taking Prozac during her pregnancy but stopped because she was not sure she should continue.  She states that she felt better during pregnancy when she was taking Prozac.    Current Medications (verified): 1)  None  Allergies  (verified): No Known Drug Allergies  Review of Systems General:  Denies chills, fatigue, fever, and loss of appetite. GI:  Denies abdominal pain, change in bowel habits, constipation, diarrhea, nausea, and vomiting. GU:  Complains of abnormal vaginal bleeding and dysuria; denies discharge. Psych:  Complains of anxiety and depression; denies alternate hallucination ( auditory/visual), easily tearful, and thoughts /plans of harming others.  Physical Exam  General:  Well-developed,well-nourished,in no acute distress; alert,appropriate and cooperative throughout examination. Vitals reviewed.  Lungs:  Normal respiratory effort, chest expands symmetrically. Lungs are clear to auscultation, no crackles or wheezes. Heart:  Normal rate and regular rhythm. S1 and S2 normal without gallop, murmur, click, rub or other extra sounds. Cap refill 2 sec Genitalia:  Speculum exam:  Uterus normal size and position for postpartum.  No discharge or hemorrhage.  No abrasion or tears or lesions No adnexal tenderness or masses Pulses:  +2 pulses  Extremities:  No edema Psych:  Oriented X3, memory intact for recent and remote, normally interactive, good eye contact, not anxious appearing, not suicidal, not homicidal, and flat affect.   Additional Exam:  PHQ-9: 1. Little interest in doing things: 0 2. Feeling down, depressed, or hopeless: 3 3. Trouble falling/staying asleep: 2 4. Feeling  tired or having little energy: 3 5. Poor appetite or overeating: 3 6. Feeling bad about yourself: 2 7. Trouble concentrating: 0 8. Moving or speaking slowly: 0 9. Thoughts that you would be better off dead: 0  Total: 10   Impression & Recommendations:  Problem # 1:  PREGNANCY, MULTIGRAVIDA (ICD-V22.1) Post partal exam since I did speculum exam for bleeding.  Pt desires a more permanent form of birth control.  She wanted to have BTL, but was not able to get one after delivery. She would like Mirena.  Gave Patient  Information handout on Mirena and discussed it with pt today.  Pt may keep orginal appt with Dr Alvester Morin on 1/23 for IUD insertion.    Pt with flat affect during exam.  See depression.  Orders: Postpartum visit- [facility] ([id number])  Problem # 2:  OTH D/O MENSTRUATION&OTH ABN BLEED FE GNT TRACT (ICD-626.8) Assessment: New Postpartal bleeding x 6 wks.  Wearing 2 pantiliners per day.  Hg today was 11.9.  Discussed that normal bleeding can occur up to 6 wks post partum.  Likely Depo injection is also causing the bleeding.    Orders: Hemoglobin-[facility] ([id number]) Postpartum visit- [facility] (62952)  Problem # 3:  DEPRESSION (ICD-311) Assessment: Unchanged Pt with flat affect during visit.  Upon further discussion she endorses feeling overwhelmed with 55 y/o and new baby.  She denies SI/Hi, so I do not believe postpartal psychosis is playing a role.  PHQ-9 score of 10, placing her in the mild-moderate depression category, but she did not qualify by grading criteria.  For example both 1&2 needed a score of 3, but pt only scored 3 in #2.  Pt also needed to score a 3 on #9, but she scored a 0 on that number.  I think she is at low risk of harming herself or her child.   She states that she felt better while on Prozac during pregnancy.  Will resume prozac today.  Pt to f/u with Dr Alvester Morin in one week.   Her updated medication list for this problem includes:    Prozac 20 Mg Caps (Fluoxetine hcl) .Marland Kitchen... Take one tablet daily.  [facility]  Complete Medication List: 1)  Prozac 20 Mg Caps (Fluoxetine hcl) .... Take one tablet daily.  Patient Instructions: 1)  Keep appointment with Dr Alvester Morin on 01/22/11. 2)  He will discuss your mood with you on this appt. 3)  He will also place Mirena on this date.  Prescriptions: PROZAC 20 MG CAPS (FLUOXETINE HCL) take one tablet daily.  #30 x 2   Entered and Authorized by:   Angeline Slim MD   Signed by:   Angeline Slim MD on 01/15/2011   Method used:   Electronically to         [facility] [phone]* (retail)       [address]       [place], [place]  [address]       Ph: [phone]       Fax: [phone]   RxID:   [id number]    Orders Added: 1)  Hemoglobin-[facility] [[id number]] 2)  Postpartum visit- [facility] [[id number]]    Laboratory Results   Blood Tests   Date/Time Received: January 15, 2011 9:09 AM  Date/Time Reported: January 15, 2011 9:21 AM     CBC   HGB:  11.9 g/dL   (Normal Range: 09.3-23.5 in Males, 12.0-15.0 in Females) Comments: capillary sample ...............test  performed by......Marland KitchenBonnie A. [place], MLS (ASCP)cm

## 2011-02-01 NOTE — Progress Notes (Signed)
Summary: Triage   Phone Note Call from Patient Call back at [phone]   Reason for Call: Talk to Nurse Summary of Call: pt still bleeding from having baby 6 weeks ago, wants to know if thats normal? Initial call taken by: Knox Royalty,  January 11, 2011 8:46 AM  Follow-up for Phone Call        states it is still bright red. no pain. concerned & does not have appt until 1/23. appt tomorrow with Dr. Earlene Plater in am Follow-up by: Golden Circle RN,  January 11, 2011 8:56 AM     Appended Document: Triage Call and spoke to pt about menstrual bleeding. Has been ongoing since birth of baby. Has intermittently slowed down and increased over last 4-5 weeks. Recieved depo-shot prior to leaving the hospital per pt. Pt states that she has had amenorrhea in the past with this in the past. Vaginal bleeding has been bright red x3-4 days. No abdominal pain, fevers, N/V/D. Has had intermittent dysuria seen on 12/21 for. Urine cx negative for bacteria. Had to cancel appt today because of no ride. Plans for f/u appt 1/16. Currently no fatigue, weakness, dizziness. Instructed pt to begin iron supplementation. Also discussed cardiovascular RFs for pt to call back to clinic or go the ED. Pt agreeable to plan.  [name] MD

## 2011-02-01 NOTE — Miscellaneous (Signed)
Summary: EDC 12/15/10 by LMP   Clinical Lists Changes  Observations: Added new observation of EDC: 12/15/2010 (06/01/2010 11:09)     Prenatal Visit EDC Confirmation:    New working [facility]: 12/15/2010

## 2011-02-01 NOTE — Miscellaneous (Signed)
   Clinical Lists Changes  Orders: Added new Test order of Urine Culture-[facility] ([id number]) - Signed Added new Test order of GC/Chlamydia-[facility] ([id number]) - Signed Added new Service order of Pap Smear ([id number]) - Signed

## 2011-02-01 NOTE — Progress Notes (Signed)
Summary: phn msg   Phone Note Call from Patient Call back at [phone]   Caller: Patient Summary of Call: just had Csection and is supposed to go to court tomorrow - needs a note stating that she needs to postpone this until the doctor thinks she can get out. (need dates) she is bringing the baby on Wednesday and she can talk to doctor about it then, but just needs acd note asap for tomorrow Initial call taken by: De Nurse,  December 11, 2010 4:14 PM  Follow-up for Phone Call        fwd. to Dr.Newton Call and spoke to pt over the phone about need for letter. Called and spoke to nursing staff Molly Maduro) to print out letter for pt as computers not working properly at time after phone call. Will date postpartum time frame. [name] MD December 12, 2010 12:37 PM  Follow-up by: Arlyss Repress CMA,,  December 11, 2010 5:31 PM

## 2011-02-01 NOTE — Progress Notes (Signed)
Summary: Triage   Phone Note Call from Patient Call back at Home Phone [phone]   Reason for Call: Talk to Nurse Summary of Call: pt [redacted] weeks pregnant, having mild contractions & pressure,  wants to know if she can go to [place] to be triaged to see if she is in labor bc pt lives in [place] & does not want to drive all the way to [place] then have to go back home.  Initial call taken by: Knox Royalty,  December 01, 2010 8:40 AM  Follow-up for Phone Call        spoke with patient and advised that she needs to go to [facility] and not [facility]. States her contractions are mild but lot of pressure. contractions 3 minutes apart. she will go to [facility] now for evaluation. Follow-up by: Theresia Lo RN,  December 01, 2010 8:59 AM

## 2011-02-01 NOTE — Progress Notes (Signed)
Summary: triage   Phone Note Call from Patient Call back at Home Phone [phone]   Caller: Patient Summary of Call: pt went to [facility] and is still bleeding - 36 wks OB Initial call taken by: De Nurse,  November 20, 2010 3:42 PM  Follow-up for Phone Call        Pt went to MAU yesterday and was there until 8 pm.  She was checked and found to be at 3 cm.  They had her ambulate to see if the labor progressed and when it did not they sent sent her home.  She then began to bleed while there and was told that the bleeding was due to the examination.   She is still bleeding today but the contractions and pressue are still the same as it was Saturday.  Is concerned abou the bleeding.  She does not want to go back to MAU unless she is really in labor.  Told her I would call Dr. Alvester Morin to get his advise or have him speak with her.   Follow-up by: Dennison Nancy RN,  November 20, 2010 3:54 PM  Additional Follow-up for Phone Call Additional follow up Details #1::        Spoke with Dr. Alvester Morin.  He advised that if contractions are not any worse or closer together and if bleeding has not increased she should be okay.  If any of these do occur however, she should go to MAU.  He is willing to see her tomorrow if she is still concerned about the bleeding.  Told her to call us tomorrow if she would like to be placed in his clinic.  Pt agreeable. Additional Follow-up by: Dennison Nancy RN,  November 20, 2010 4:18 PM     Appended Document: triage pt is asking to speak with Bartholome Bill.

## 2011-02-01 NOTE — Miscellaneous (Signed)
Summary: letter for dentist/ts   Clinical Lists Changes called pt lmom to return call. need to know what denist to send letter to for procedure clearence.Molly Maduro [facility] CMA  September 13, 2010 10:51 AM called pt and lmvm 'letter ready for pick up'.Arlyss Repress CMA,  September 14, 2010 12:15 PM

## 2011-02-01 NOTE — Progress Notes (Signed)
Summary: faxed letter   Phone Note Call from Patient   Caller: Patient Summary of Call: Pt need letter faxed to attention of:  Carole Civil  fax# [phone] Initial call taken by: Abundio Miu,  December 12, 2010 4:10 PM

## 2011-02-01 NOTE — Assessment & Plan Note (Signed)
Summary: ob visit,tcb   Vital Signs:  Patient profile:   25 year old female Weight:      160.9 pounds BP sitting:   118 / 78  Vitals Entered By: Arlyss Repress CMA, (Huerta  3, 2011 11:28 AM)  Primary Care Provider:  Delbert Harness MD  CC:  [phone] at 33 6/7 weeks by LMP.  History of Present Illness: This is a 25 yo AAF who presents to clinic for a routine OB visit.  She is G4P1021 at 25 6/7 weeks weeks by LMP.  Today, pt complains of L hip pain that has been going on for months, but has become progressively worse.  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  She has not taken any meds for pain, only heating pads which does not relieve the pain.  The pain at its worst is an 8/10.  It is located at her L hip and radiates to her pubic bone.  At her last visit, there was concern about dental work during pregnancy.  Pt states that the dental pain and swelling has resolved, and will wait until after delivery to see a dentist.  Pt also would like to sign consent for a tubal ligation s/p L&D.      Habits & Providers  Alcohol-Tobacco-Diet     Cigarette Packs/Day: n/a  Current Medications (verified): 1)  Prenatal Vitamins .... One Tablet By Mouth Daily 2)  Prozac 20 Mg Caps (Fluoxetine Hcl) .... Take One Tablet Daily.  Allergies (verified): No Known Drug Allergies  Review of Systems       please see OB flowsheet  Physical Exam  General:  Well appearing, no apparent distress. Bright affect.   Abdomen:  gravid abdomen, fundal height measures appropriate for gest age.  Leopolds maneuvers with VTX presentation.  Msk:  TTP of uterine ligament and pubic bone.  Normal ROM and no swelling.  Gait normal.    Impression & Recommendations:  Problem # 1:  PREGNANCY, MULTIGRAVIDA (ICD-V22.1)  Patient seen in OB clinic.  She is a 25 yr old G4P1021 at 25 6/7 weeks by LMP, confirmed by [place] on 07/21/10 and giving an EDD of 12/15/10.  She is here today  with her boyfriend, Lori Huerta.  Labs and studies from throughout pregnancy have been reviewed.  In one-to-one interview, she reports feeling safe and not threatened.  She states that she had thought about having an abortion earlier in the pregnancy purely for economic reasons, but denies any previous physical or emotional abuse.  She states that her relationship with Lori Huerta is stable and healthy.   Prior positive UCx on May 01, 2010 for E coli, treated with Keflex and with negative UCx TOC on 09/12/2010.  At present she denies any dysuria or changes in urinary patterns, odor or appearance.  Reports good daily fetal movement and denies discharge or bleeding. Recommend repeat OB visit with her primary doctor, GBS and cervical cultures in 2 weeks.  Recommend continued screening for depression and safety despite reassuring report from patient today.  PHQ-9 score today was 1.  Orders: Medicaid OB visit - [facility] ([id number])  Problem # 2:  Preventive Health Care (ICD-V70.0) Assessment: Comment Only  Complete Medication List: 1)  Prenatal Vitamins  .... One tablet by mouth daily 2)  Prozac 20 Mg Caps (Fluoxetine hcl) .... Take one tablet daily.  Other Orders: Influenza Vaccine NON MCR ([id number])  Patient Instructions: 1)  It was  great to meet you today. 2)  Please purchase OTC Tylenol 500 and take 1-2 tab every 8 hrs as needed for hip pain. 3)  Apply heating pads to affected area as needed for pain. 4)  Continue stretching and physical activity as tolerated. 5)  Every morning and every evening evaluate whether you think the baby is moving enough.  If not, then do kick counts as follows.  Sit in a quiet place and count each movement.  If you get to 5 baby movements in an hour you can stop.  If at 1 hour you have less than 5 movements, keep counting for another hour.  If you don't get 10 movements in these 2 hours, you should go to the MAU or call the clinic. 6)  If you have vaginal bleeding, suspect your water has broken  (large gush of fluid or continuous slow trickle from vagina) or you have more than 5 contractions per hour for 2 hours straight, please go directly to the MAU. 7)  Please follow-up with Dr. Alvester Morin in 2 weeks. 8)  Thank you.   Orders Added: 1)  Influenza Vaccine NON MCR [[id number]] 2)  Medicaid OB visit - [facility] [[id number]]   Immunizations Administered:  Influenza Vaccine # 1:    Vaccine Type: Fluvax Non-MCR    Site: left deltoid    Mfr: GlaxoSmithKline    Dose: 0.5 ml    Route: IM    Given by: Arlyss Repress CMA,    Exp. Date: 06/27/2011    Lot #: [id number]    VIS given: 07/25/10 version given Huerta  3, 2011.  Flu Vaccine Consent Questions:    Do you have a history of severe allergic reactions to this vaccine? no    Any prior history of allergic reactions to egg and/or gelatin? no    Do you have a sensitivity to the preservative Thimersol? no    Do you have a past history of Guillan-Barre Syndrome? no    Do you currently have an acute febrile illness? no    Have you ever had a severe reaction to latex? no    Vaccine information given and explained to patient? yes    Are you currently pregnant? yes   Immunizations Administered:  Influenza Vaccine # 1:    Vaccine Type: Fluvax Non-MCR    Site: left deltoid    Mfr: GlaxoSmithKline    Dose: 0.5 ml    Route: IM    Given by: Arlyss Repress CMA,    Exp. Date: 06/27/2011    Lot #: [id number]    VIS given: 07/25/10 version given Huerta  3, 2011.   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 test: 04/17/2010  Flowsheet View for Follow-up Visit    Estimated weeks of       gestation:     33 6/7    Weight:     160.9    Blood pressure:   118 / 78    Headache:     No    Nausea/vomiting:   No    Edema:     TrLE    Vaginal bleeding:   no    Vaginal discharge:   no    Fundal height:      34    FHR:       150    Fetal activity:     yes    Labor symptoms:   no    Fetal position:     vertex    Taking prenatal vits?   Y    Smoking:     n/a    Next visit:     2 wk    Resident:     Tye Savoy    Preceptor:     Mauricio Po    Comment:     see CPOE    Flowsheet View for Follow-up Visit    Estimated weeks of       gestation:     33 6/7    Weight:     160.9    Blood pressure:   118 / 78    Hx headache?     No    Nausea/vomiting?   No    Edema?     TrLE    Bleeding?     no    Leakage/discharge?   no    Fetal activity:       yes    Labor symptoms?   no    Fundal height:      34    FHR:       150    Fetal position:      vertex    Taking Vitamins?   Y    Smoking PPD:   n/a    Comment:     see CPOE    Next visit:     2 wk    Resident:     Tye Savoy    Preceptor:     [facility] for Follow-up Visit    Estimated weeks of       gestation:     33 6/7    Weight:     160.9    Blood pressure:   118 / 78    Hx headache?     No    Nausea/vomiting?   No    Edema?     TrLE    Bleeding?     no    Leakage/discharge?   no    Fetal activity:       yes    Labor symptoms?   no    Fundal height:      34    FHR:       150    Fetal position:      vertex    Taking Vitamins?   Y    Smoking PPD:   n/a    Comment:     see CPOE    Next visit:     2 wk    Resident:     [place]    Preceptor:     Mauricio Po      Appended Document: ob visit,tcb Patient states that  she would like to have permanent  sterilization upon delivery (BTL).  We discussed the irreversible nature of this procedure, as well as the possibility (albeit low-- 1-2% lifetime) of failure.  She states she wishes to sign the consent form.  It has also been explained to her that signing the consent form does not prevent her from the possibility of changing her mind and declining the procedure at any time before it is performed.

## 2011-02-01 NOTE — Assessment & Plan Note (Signed)
Summary: ob visit/eo   Vital Signs:  Patient profile:   25 year old female Height:      66 inches Weight:      169 pounds BMI:     27.38 Temp:     98.3 degrees F oral Pulse rate:   79 / minute BP sitting:   122 / 87  (left arm) Cuff size:   regular  Vitals Entered By: Jimmy Footman, CMA (November 29, 2010 9:35 AM) CC: OB 35   5/7 Is Patient Diabetic? No Pain Assessment Patient in pain? no        CC:  OB 35   5/7.  Habits & Providers  Alcohol-Tobacco-Diet     Cigarette Packs/Day: n/a  Allergies: No Known Drug Allergies  Physical Exam  General:  alert and well-developed.   Head:  normocephalic and atraumatic.   Mouth:  good dentition.   Neck:  supple and full ROM.   Lungs:  normal respiratory effort and no intercostal retractions.   Heart:  normal rate and regular rhythm.   Abdomen:  gravid abdomen, fundal height appropriate Extremities:  2+ peripheral pulses ,no edema  Neurologic:  alert & oriented X3.     Impression & Recommendations:  Problem # 1:  PREGNANCY, MULTIGRAVIDA (ICD-V22.1) 24 YOG [id number] here for routine prenatal visit. Pt reports vaginal discharge and LOF fluid. Was seen in MAU 2-3 days ago for ? LOF. Ferning negative, was approx 3 centimeters at the time. Pt still reports intermittent LOF.+ Sexual activity in last 24 hours.  In office nitrazine and ferning negative. No vaginal bleeding. Fetal activity at baseline. See flowsheet for full details.  Will follow up next week. Also discussed labor red flags.   Complete Medication List: 1)  Prenatal Vitamins  .... One tablet by mouth daily 2)  Prozac 20 Mg Caps (Fluoxetine hcl) .... Take one tablet daily.  Other Orders: Miscellaneous Lab Charge-[facility] [id number])   Orders Added: 1)  Miscellaneous Lab Charge-[facility] [[id number]]     Flowsheet View for Follow-up Visit    Estimated weeks of       gestation:     37 5/7    Weight:     169    Blood pressure:   122 / 87    Hx headache?     No    Nausea/vomiting?    No    Edema?     TrLE    Bleeding?     no    Leakage/discharge?   leak?    Fetal activity:       yes    Labor symptoms?   few ctx    Fundal height:      37.5     FHR:       140s    Fetal position:      vertex    Cx dilation:     2    Cx effacement:   80%    Fetal station:     -2    Taking Vitamins?   Y    Smoking PPD:   n/a    Comment:     Pt went to [facility] for LOF this week-ferning negative    Next visit:     1 wk    Resident:     Alvester Morin   Laboratory Results  Date/Time Received: November 29, 2010 11:18 AM  Date/Time Reported: November 29, 2010 11:25 AM   Other Tests  Ferning: absent Comments: ...........test performed by...........Marland KitchenTerese Door, CMA

## 2011-02-01 NOTE — Progress Notes (Signed)
   Phone Note Other Incoming   Caller: [facility] ED Details for Reason: UTI f/u Summary of Call: Pt [redacted] wks pregnant, noted earlier phone note, seen at [facility] for back pain, diagnosed with UTI started on antibiotics. Needs follow-up within next 24-48 hours Initial call taken by: Milinda Antis MD,  May 17, 2010 12:26 AM     Appended Document:  lm for her to call. will make her an appt at that time
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
## 2011-02-01 NOTE — Assessment & Plan Note (Signed)
Summary: OB Visit @ 35 4/7 wks   Vital Signs:  Patient profile:   25 year old female Height:      66 inches Weight:      163 pounds BMI:     26.40 Pulse rate:   81 / minute BP sitting:   120 / 77  (right arm) Cuff size:   regular  Vitals Entered By: Tessie Fass CMA (November 14, 2010 10:10 AM) CC: OB Visit   CC:  OB Visit.  Habits & Providers  Alcohol-Tobacco-Diet     Cigarette Packs/Day: n/a  Allergies: No Known Drug Allergies  Physical Exam  General:  alert and well-developed.   Head:  normocephalic and atraumatic.   Eyes:  vision grossly intact.   Nose:  no external deformity.   Mouth:  good dentition.   Neck:  supple and full ROM.   Lungs:  CTAB Heart:  RRR Abdomen:  gravid abdomen, fundal heigth appropriate for dates, FHT reassuring Extremities:  2+ peripheral pulses Psych:  normally interactive and good eye contact.  PHQ-9 score of 0   Impression & Recommendations:  Problem # 1:  PREGNANCY, MULTIGRAVIDA (ICD-V22.1) 25 YO [id number] here at 35 4/7 weeks here for routine prenatal visit. Adequate fetal development to date. Adequate weight gain. Improved mood stable with prozac. PHQ-9 score 0. Hip pain still present-however, well controlled with prn tylenol (<2 gm per day use). Discussed with pt MSK changes especially in hip area for labor changes. Also encouraged increased fluid intake as dehydration may contribute to MSK pain.   No HI/SI. Stable home environment. Good interactions w/ FOB. See flowsheet for full details. Plan to obtain cervical cultures including GC, Chl, and GBS.   PTL red flags reviewed w/ pt. Plan to followup in 1 week. Pt agreeable to overall plan.   Orders: Grp B Probe-[facility] ([id number])  Complete Medication List: 1)  Prenatal Vitamins  .... One tablet by mouth daily 2)  Prozac 20 Mg Caps (Fluoxetine hcl) .... Take one tablet daily.  Other Orders: GC/Chlamydia-[facility] ([id number])  Patient Instructions: 1)  It was good to see you again 2)   The baby is doing overall well today 3)  We will check some routine cervical cultures 4)  Come back next week for a routine followup appt 5)  If you develop any severe abdominal pain, vaginal bleeding, discharge that is soaking through your clothes, or a sudden gush of fluid, please give [place] a call or go to the MAU 6)  Remember to drink plenty of water as this will help with your back pain. 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 test: 04/17/2010   Flowsheet View for Follow-up Visit    Estimated weeks of       gestation:     35 4/7    Weight:     163    Blood pressure:   120 / 77    Headache:     No    Nausea/vomiting:   No    Edema:     0    Vaginal bleeding:   no    Vaginal discharge:   d/c    Fundal height:      35.5    FHR:       150s    Fetal activity:     yes    Labor symptoms:   no    Fetal position:     vertex    Cx Dilation:     0    Cx Effacement:   0%    Cx Station:     high    Taking prenatal vits?   Y    Smoking:     n/a    Next visit:     1 wk    Resident:     Alvester Morin    Appended Document: OB Visit @  35 4/7 wks     Clinical Lists Changes  Orders: Added new Test order of [facility]- Est Level  3 ([id number]) - Signed

## 2011-02-01 NOTE — Progress Notes (Signed)
Summary: triage   Phone Note Call from Patient Call back at [phone]   Caller: Patient Summary of Call: Still not feeling good had diahrrea and nausea.  Went to ed last night.  Now has headache. Initial call taken by: Clydell Hakim,  June 16, 2010 1:48 PM  Follow-up for Phone Call        seen in ED last night, given fluids, diarrhea stopped this am around 10am, has very bad HA that started in ED last night, given phenergan, imodiom and Tylenol.  Took Imodiom in ED and it did not work so did not fill any RX's that ED gave her, is not eating or drinking because she does not want the diarrheaa to come back, does not have a ride to office at this time, advised to follow doctors orders from ED, that she needs to take in fluids so she does not get dehydrated angain, voiced understanding,  and to call me back if she can get a ride and I will see if we have any apt available at this time. , to PCP. Follow-up by: Gladstone Pih,  June 16, 2010 1:53 PM  Additional Follow-up for Phone Call Additional follow up Details #1::        agreed about diarrhea, but concerned about previous call.  Please have patient schedule follow-up with Dr. Alvester Morin for [facility] care ASAP. Additional Follow-up by: Delbert Harness MD,  June 16, 2010 3:00 PM    Additional Follow-up for Phone Call Additional follow up Details #2::    called pt back, left message to call  Follow-up by: Gladstone Pih,  June 16, 2010 3:58 PM  Additional Follow-up for Phone Call Additional follow up Details #3:: Details for Additional Follow-up Action Taken: spoke with pt still does not have ride, voiced understand to go to ED if needed over the weekend, and to [facility] a routine OB visit, voiced understanding. Additional Follow-up by: Gladstone Pih,  June 16, 2010 4:12 PM

## 2011-02-01 NOTE — Progress Notes (Signed)
Summary: triage   Phone Note Call from Patient Call back at [phone]   Caller: Patient Summary of Call: OB very lightheaded and cannot stand up- had to leave work Initial call taken by: De Nurse,  July 27, 2010 1:39 PM  Follow-up for Phone Call        advised drinking a full glass of water every half hour & laying down to rest in a cool room. call me back at 3pm & tell me how you are feeling. she agreed. she had eaten a good lunch Follow-up by: Golden Circle RN,  July 27, 2010 1:58 PM  Additional Follow-up for Phone Call Additional follow up Details #1::        drank water & rested. now has a HA. still dizzy & lightheaded. 107/71 at [facility]. she is going to [employer]. told her not to drive herself. Additional Follow-up by: Golden Circle RN,  July 27, 2010 3:15 PM

## 2011-02-01 NOTE — Letter (Signed)
Summary: Handout Printed  Printed Handout:  - Prenatal-Record-(G6-10)-[facility]

## 2011-02-01 NOTE — Miscellaneous (Signed)
Summary: walk in   Clinical Lists Changes she thought her appt was today. it is next wk. she is here from [place] & would like to be seen today. states she has been on the prozac & her anxiety spells have only happened once since she started this med. she is very concrened about being pregnant & taking prozac. appt made with Dr. Denyse Amass as he had a cancellation at this time. VS obtained.Golden Circle RN  August 29, 2010 10:22 AM

## 2011-02-01 NOTE — Miscellaneous (Signed)
Summary: bloody nose   Clinical Lists Changes states she has been bleeding x 20 minutes. she has been tilting her head back but this makes her vomit. told her to pinch nose firmly for 10 minutes. tilting head does not work & will make her vomit. explained that some people have blood vessals close to the surface & will bleed from a sneeze or dry air. firm pressure x 10 min will stop it.  after there will be a clot. do not blow it out for 1 hr or she may start again. her bps have been low at all visits. she agreed with plan & will call if further problems.Marland KitchenMarland KitchenGolden Circle RN  October 03, 2010 8:36 AM

## 2011-02-01 NOTE — Letter (Signed)
Summary: Generic Letter  [facility]  [address]   [place], [place] [address]   Phone: [phone]  Fax: [phone]    06/07/2010  THERSIA PETRAGLIA [address] [place], [place]  [address]  Dear Ms. Terhaar,  We have been unable to reach you to make an appointment. Please call the office with your new number & make an appointment to see your doctor. the number is [phone].      Sincerely,   Golden Circle RN
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
## 2011-02-01 NOTE — Assessment & Plan Note (Signed)
Summary: OB Visit: 26 4/7   Vital Signs:  Patient profile:   25 year old female Height:      66 inches Weight:      144 pounds Pulse rate:   94 / minute BP sitting:   102 / 70  (left arm) Cuff size:   regular  Vitals Entered By: Tessie Fass CMA (September 12, 2010 9:08 AM) CC: OB Visit   CC:  OB Visit.  Habits & Providers  Alcohol-Tobacco-Diet     Cigarette Packs/Day: n/a  Allergies: No Known Drug Allergies  Physical Exam  General:  alert and well-developed.   Head:  normocephalic and atraumatic.   Eyes:  vision grossly intact.   Ears:  R ear normal and L ear normal.   Mouth:  + R jaw swelling.  R lower post molars w/ peripheral swelling; minimal erythema, no drainage Neck:  supple and full ROM.   Lungs:  CTAB Heart:  RRR Abdomen:  gravid abdomen, fundal height appropriate, adequate fetal heart rate Extremities:  2+ peripheral pulses, no ede,a  Psych:  good eye contact and not anxious appearing.     Impression & Recommendations:  Problem # 1:  PREGNANCY, MULTIGRAVIDA (ICD-V22.1) 25 YO G2P1 here at 60 4/7 weeks here for routine prenatal visit. Adequate fetal development to date. Adequate weight gain. Mood much improved s/p prozac administration.  No HI/SI. Stable home environment. Good interactions w/ FOB. See flowsheet for full details. Paln for 1 hour glucola. Will also obtain urine culture for TOC given hx/o UTI. Plan to followup with pt in 4 weeks. PTL red flags reviewed w/ pt. Pt agreeable to overall plan.   Orders: Glucose 1 hr-[facility] (16109) Urine Culture-[facility] ([id number])  Problem # 2:  DENTAL PAIN (ICD-525.9) Pt w/ planned removal of broken tooth pending PCP clearance. Will send clearance letter to dentist. Will also give as needed percocet for pain. Currently no clinical signs of infections. Red flags reviewed for return (fever, worsening pain despite percocet, inability to tolerate by mouth intake). Pt agreeable to plan.  Complete Medication List: 1)   Prenatal Vitamins  .... One tablet by mouth daily 2)  Prozac 20 Mg Caps (Fluoxetine hcl) .... Take 1 tablet daily 3)  Percocet 5-325 Mg Tabs (Oxycodone-acetaminophen) .Marland Kitchen.. 1 tab as needed for dental pain every 6 hours  Patient Instructions: 1)  It was good to see you today 2)  Your mood is doing much better! 3)  Give [place] a call though if your mood seems to worsen 4)  We will check your sugar level today 5)  We will also check your urine today for infection 6)  Your baby is doing great! 7)  If you have any severe abdominal pain, vaginal bleeding, o rdecreased fetal activity(<5 kicks per hour when sitting down for >1 hour) give [place] or women's hospital a call 8)  Come back to see me in 4 weeks 9)  Otherwise call if you have any questions 10)  God Bless, 11)  [name] MD Prescriptions: PERCOCET 5-325 MG TABS (OXYCODONE-ACETAMINOPHEN) 1 tab as needed for dental pain every 6 hours  #20 x 0   Entered and Authorized by:   [name] MD   Signed by:   [name] MD on 09/12/2010   Method used:   Print then Give to Patient   RxID:   [id number]    Flowsheet View for Follow-up Visit    Estimated weeks of       gestation:  26 4/7    Weight:     144    Blood pressure:   102 / 70    Hx headache?     No    Nausea/vomiting?   No    Edema?     0    Bleeding?     no    Leakage/discharge?   d/c    Fetal activity:       yes    Labor symptoms?   no    Fundal height:      26.0    FHR:       140s    Taking Vitamins?   Y    Smoking PPD:   n/a    Next visit:     4 wk    Resident:     Alvester Morin    Preceptor:     Jennette Kettle
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
## 2011-02-01 NOTE — Assessment & Plan Note (Signed)
Summary: OB Visit: 22 5/7 wks   Vital Signs:  Patient profile:   25 year old female Weight:      141 pounds Temp:     98.8 degrees F oral Pulse rate:   96 / minute Pulse rhythm:   regular BP sitting:   122 / 78  (left arm) Cuff size:   regular  Vitals Entered By: Loralee Pacas CMA (August 16, 2010 2:27 PM) Comments pt has been to ED b/c she has been experiencing lightheadedness and dizzy, it has been affecting her job performance   Habits & Providers  Alcohol-Tobacco-Diet     Cigarette Packs/Day: n/a  Allergies: No Known Drug Allergies  Physical Exam  General:  alert and well-developed.   Head:  normocephalic and atraumatic.   Eyes:  vision grossly intact.   Ears:  R ear normal and L ear normal.   Nose:  no external deformity.   Mouth:  good dentition.   Neck:  supple and full ROM.   Lungs:  CTAB, no wheezes, rales, rhoncii Heart:  RRR, no rubs, gallops, murmurs Abdomen:  gravid abdomen size appropriate for dates,  Extremities:  2+ peripheral pulses, no edema   Impression & Recommendations:  Problem # 1:  PREGNANCY, MULTIGRAVIDA (ICD-V22.1) Assessment Unchanged Otherwise normal fetal development to date. Weight gain appropriate. Stable home/social environement per pt. Reinforced need for adeqate by mouth intake as pt on feet >8 hours per day. WIll followup w/ pt in 2 weeks. Also gave recommendations to use vit B6 for nausea and folate as pt reports nausea w/ prenatal vitamin use.  Orders: Glucose Cap-[facility] (16109) [facility]- Est Level  3 (60454)  Problem # 2:  ANXIETY (ICD-300.00) Pt w/ intermittent lightheadedness and heart palpitations, though no CP, SOB.  Likley secondary to underling anxiety in setting of pregnancy. Pt reports simiilar sxs w/ previous anxiety attacks. No depressed mood. No HI/SI. Plan to treat w/ prozac. Will followup in 2 weeks. Red flags reviewed. Pt agreeable to plan.  Her updated medication list for this problem includes:    Prozac 20 Mg Caps  (Fluoxetine hcl) .Marland Kitchen... Take 1 tablet daily  Complete Medication List: 1)  Prenatal Vitamins  .... One tablet by mouth daily 2)  Prozac 20 Mg Caps (Fluoxetine hcl) .... Take 1 tablet daily  Patient Instructions: 1)  It was good to see you today 2)  Your baby is doing well 3)  Try taking folate and vit B6 seperately to help with the nausea from your prenatal vitamin 4)  I will prescribe you prozac for your anxiety as this has been shown safest to use in pregnancy 5)  If you have any preterm labor symptoms (severe abd pain, vaginal bleeding, severe vaginal discharge) go to [facility] hospital for evaluation 6)  Come back to see me in 2 weeks 7)  Otherwise call for any questions 8)  God Bless, 9)  [name] MD Prescriptions: PROZAC 20 MG CAPS (FLUOXETINE HCL) take 1 tablet daily  #30 x 2   Entered and Authorized by:   [name] MD   Signed by:   [name] MD on 08/16/2010   Method used:   Print then Give to Patient   RxID:   [id number]     Flowsheet View for Follow-up Visit    Estimated weeks of       gestation:     22 5/7    Weight:     141    Blood pressure:   122 /  78    Headache:     No    Nausea/vomiting:   nausea    Edema:     0    Vaginal bleeding:   no    Vaginal discharge:   no    Fundal height:      23.0    FHR:       140s    Fetal activity:     yes    Labor symptoms:   no    Taking prenatal vits?   Y    Smoking:     n/a    Next visit:     2 wk    Resident:     Alvester Morin    Preceptor:     Mauricio Po

## 2011-02-01 NOTE — Miscellaneous (Signed)
Summary: PROBLEM LIST UPDATE    Clinical Lists Changes  Problems: Removed problem of DENTAL PAIN (ICD-525.9) Removed problem of DYSPAREUNIA (ICD-625.0) Removed problem of VAGINAL BLEEDING, ABNORMAL (ICD-626.9) Removed problem of VAGINAL DISCHARGE (ICD-623.5) Removed problem of RHINITIS, ALLERGIC (ICD-477.9) Removed problem of HEMORRHOIDS, NOS (ICD-455.6)

## 2011-02-01 NOTE — Miscellaneous (Signed)
Summary: BTL  BTL   Imported By: Knox Royalty 11/02/2010 13:59:14  _____________________________________________________________________  External Attachment:    Type:   Image     Comment:   External Document

## 2011-02-01 NOTE — Letter (Signed)
Summary: Generic Letter  [facility]  [address]   [place], [place] [address]   Phone: [phone]  Fax: [phone]    09/13/2010  Tenaya Prew [address] [place], [place]  [address]  To whom it may concern, Kambrey Hagger is medically cleared for dental procedures necessary for resolution of dental pain. Because Shelsy is pregnant, avoiding use of antibiotics such as doxycycline, clarthromycin, and fluoruquinolones such be avoided if antibiotic prophylaxis is warranted. If there are any questions, please feel free to contact the [facility] at your earliest convenience.            Sincerely,   [name] MD

## 2011-02-01 NOTE — Assessment & Plan Note (Signed)
Summary: OB/KH   NEWTON NOT AVAIL   Vital Signs:  Patient profile:   25 year old female Weight:      163 pounds BP sitting:   130 / 80  Vitals Entered By: Arlyss Repress CMA, (November 22, 2010 1:38 PM)  CC:  36.5 OB Visit.  Habits & Providers  Alcohol-Tobacco-Diet     Cigarette Packs/Day: n/a  Current Medications (verified): 1)  Prenatal Vitamins .... One Tablet By Mouth Daily 2)  Prozac 20 Mg Caps (Fluoxetine Hcl) .... Take One Tablet Daily.  Allergies (verified): No Known Drug Allergies   Impression & Recommendations:  Problem # 1:  PREGNANCY, MULTIGRAVIDA (ICD-V22.1) Assessment Unchanged  24 YO [id number] here at 36 5/7 weeks here for routine prenatal visit. Adequate fetal development to date. Adequate weight gain. Cervix exam unchanged today. Plan to follow-up in 1 week. Labor precautions reviewed.  Orders: Medicaid OB visit - [facility] ([address]409)  Complete Medication List: 1)  Prenatal Vitamins  .... One tablet by mouth daily 2)  Prozac 20 Mg Caps (Fluoxetine hcl) .... Take one tablet daily.  Patient Instructions: 1)  It was good tomeet you 2)  The baby is doing overall well today 3)  Come back next week for a routine followup appt 4)  If you develop any severe abdominal pain, vaginal bleeding, discharge that is soaking through your clothes, or a sudden gush of fluid, please give [place] a call or go to the MAU 5)  Remember to drink plenty of water as this will help with your back pain. 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 test: 04/17/2010   Flowsheet View for Follow-up Visit    Estimated weeks of       gestation:     36 5/7    Weight:     163    Blood pressure:   130 / 80    Headache:     No    Nausea/vomiting:   No    Edema:     0    Vaginal bleeding:   no    Vaginal discharge:   no    Fundal height:      36.5    FHR:       140s    Fetal activity:     yes    Labor symptoms:   few ctx    Fetal position:     vertex    Cx Dilation:     3  Cx Effacement:   70%    Cx Station:     -2    Taking prenatal vits?   Y    Smoking:     n/a    Next visit:     1 wk    Resident:     Earlene Plater    Comment:     Patient went to [facility] this week for contractions. Did not progress to active labor. Patient states that she was told that she was 3/80/high/vertex. She is starting to become frustrated.    Flowsheet View for Follow-up Visit    Estimated weeks of       gestation:     36 5/7    Weight:     163    Blood pressure:   130 / 80    Hx headache?     No    Nausea/vomiting?   No    Edema?     0    Bleeding?     no    Leakage/discharge?   no    Fetal activity:       yes    Labor symptoms?   few ctx    Fundal height:      36.5    FHR:       140s    Fetal position:      vertex    Cx dilation:     3    Cx effacement:   70%    Fetal station:     -2    Taking Vitamins?   Y    Smoking PPD:   n/a    Comment:     Patient went to [facility] this week for contractions. Did not progress to active labor. Patient states that she was told that she was 3/80/high/vertex. She is starting to become frustrated.    Next visit:     1 wk    Resident:     Earlene Plater
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
## 2011-02-01 NOTE — Letter (Signed)
Summary: Handout Printed  Printed Handout:  - Prenatal-Record-[facility] 

## 2011-02-01 NOTE — Progress Notes (Signed)
Summary: triage   Phone Note Call from Patient Call back at [phone] or [phone]   Caller: Patient Summary of Call: Pt wondering where she can be referred to to have an abortion. Initial call taken by: Clydell Hakim,  June 19, 2010 11:01 AM  Follow-up for Phone Call        told her to check yellow pages [facility]. Follow-up by: Golden Circle RN,  June 19, 2010 11:10 AM  Additional Follow-up for Phone Call Additional follow up Details #1::        I agree.  [facility] that will do pregnancy termination at her advanced gestation as the [place] office may only do medical abortion.  She may also try [facility] at [address] [place], [place] [phone].  Additional resources available on the Wiki.  Will flag this to her OB provider- Dr. Alvester Morin. Additional Follow-up by: Delbert Harness MD,  June 19, 2010 1:21 PM

## 2011-02-01 NOTE — Progress Notes (Signed)
Summary: Amenorrhea   Phone Note Call from Patient Call back at [phone]   Caller: Patient Summary of Call: Has not had a period since Dec 30th.  Would like to be seen asap. Initial call taken by: Clydell Hakim,  February 06, 2010 3:51 PM  Follow-up for Phone Call        I asked her about the multiple [facility]. states she has to depend on someone for a ride & they do not always come thru for her.  not on any birth control. states depo gives her bad HA and she can not seem to get a ride when she is due for it. wants to try the pill  will be here at 8:30am tomorrow. told her to please give 24hrs notice in the future if unable to come in Follow-up by: Golden Circle RN,  February 06, 2010 4:14 PM

## 2011-02-01 NOTE — Miscellaneous (Signed)
Summary: Hospital D/C Followup   Medications Added HYDROCHLOROTHIAZIDE 25 MG TABS (HYDROCHLOROTHIAZIDE) 1 tab daily       Clinical Lists Changes  Pt POD #3 today for c-section secondary to variable decels w/ planned d/c today. Pt also noted to have SBPs into 150s. Pt reports hx/ o pre-X with last pregnancy. Case discussed with Dr. Shawnie Pons w/ recs for pre-x labs and HCTZ at d/c. Pre-X labs obtained prior to  discharge WNL. Will rx HCTZ and have pt followup for repeat BP check on 12/07/10. BP noted 130s/80s at d/c. Will schedule pt for BP check 12/07/10 at baby wt check. Discussed Pre-X red flags extensively prior to discharge.  [name] MD  Medications: Added new medication of HYDROCHLOROTHIAZIDE 25 MG TABS (HYDROCHLOROTHIAZIDE) 1 tab daily - Signed Rx of HYDROCHLOROTHIAZIDE 25 MG TABS (HYDROCHLOROTHIAZIDE) 1 tab daily;  #30 x 0;  Signed;  Entered by: [name] MD;  Authorized by: [name] MD;  Method used: Print then Give to Patient Rx of HYDROCHLOROTHIAZIDE 25 MG TABS (HYDROCHLOROTHIAZIDE) 1 tab daily;  #30 x 1;  Signed;  Entered by: [name] MD;  Authorized by: [name] MD;  Method used: Electronically to [facility]. [phone]*, [address], [place], [place], [place]  [address], Ph: [phone] or [phone], Fax: [phone]    Prescriptions: HYDROCHLOROTHIAZIDE 25 MG TABS (HYDROCHLOROTHIAZIDE) 1 tab daily  #30 x 1   Entered and Authorized by:   [name] MD   Signed by:   [name] MD on 12/05/2010   Method used:   [facility]        [facility] [address] [phone]* (retail)       [address]       [place], [place]  [address]       Ph: [phone] or [phone]       Fax: [phone]   RxID:   [id number] HYDROCHLOROTHIAZIDE 25 MG TABS (HYDROCHLOROTHIAZIDE) 1 tab daily  #30 x 0   Entered and Authorized by:   [name] MD   Signed by:   [name] MD on 12/05/2010   Method used:   Print then Give to Patient   RxID:    [id number]

## 2011-02-01 NOTE — Assessment & Plan Note (Signed)
Summary: pain upon urination x 1 week/del 12/3 by C-Section/newton/bmc   Vital Signs:  Patient profile:   25 year old female Height:      66 inches Weight:      146 pounds BMI:     23.65 Temp:     98.7 degrees F oral Pulse rate:   83 / minute BP sitting:   114 / 81  (left arm) Cuff size:   regular  Vitals Entered By: Tessie Fass CMA (December 20, 2010 10:03 AM) CC: dysuria   Primary Provider:  [name] MD  CC:  dysuria.  History of Present Illness: Complains of sharp pain, more on the inside than at the urethra, for the past week.  She had a C/S 2-3 weeks ago.  The pain started after discharge.  It is seperate from the C/S site.  It only occurs during urination, and is worse at the end of urination especially the longer amount of time she urinates for.  No pain with defecation or other valsalva movements. No external pain or pressure.  Difficult for her to describe the type and location of pain, just states that it hurts "inside" and points to suprapubic/umbilical area.  Has been drinking clear liquids and tolerating solid foods well. No N/V/D. No fevers, SOB, CP.  Still having some lochia, was brown but has transitioned to bright red.  Only needs a panty liner.  Has not has intercourse since delivery. No genital lesions per pt.    Current Medications (verified): 1)  Prenatal Vitamins .... One Tablet By Mouth Daily 2)  Prozac 20 Mg Caps (Fluoxetine Hcl) .... Take One Tablet Daily. 3)  Hydrochlorothiazide 25 Mg Tabs (Hydrochlorothiazide) .Marland Kitchen.. 1 Tab Daily  Allergies (verified): No Known Drug Allergies  Review of Systems       The patient complains of abdominal pain.  The patient denies anorexia, fever, weight loss, chest pain, hemoptysis, melena, hematochezia, severe indigestion/heartburn, hematuria, incontinence, and genital sores.    Physical Exam  General:  Well-developed,well-nourished,in no acute distress; alert,appropriate and cooperative throughout  examination Lungs:  normal breath sounds, no crackles, and no wheezes.   Heart:  normal rate, regular rhythm, and no murmur.   Abdomen:  soft, TTP in lower abdomen, + BS; C/S scar well healing, nonerythematous, no drainage. No masses.  Genitalia:  Deferred per pt, would like to avoid since she is still having vaginal bleeding.   Impression & Recommendations:  Problem # 1:  DYSURIA (ICD-788.1) UA WNL; unlikely to be UTI but will send urine for culture just in case. Could be musculoskeletal, but would expect pain to occur with all valsalva maneuvers. Unlikely endometritis: no fevers, mild abdominal pain.  Could be bladder irritation from a uterine suture.  Unlikely to be PID or GC/Chlamydia.  Pt with all PNL negative and denies intercourse or lesions.  Will continue to monitor and would expect improvement.  Pt appeared relieved and content to watch and wait.   Orders: Urinalysis-[facility] (00000) Urine Culture-[facility] ([id number]) [facility]- Est Level  3 (09811)  Complete Medication List: 1)  Prenatal Vitamins  .... One tablet by mouth daily 2)  Prozac 20 Mg Caps (Fluoxetine hcl) .... Take one tablet daily. 3)  Hydrochlorothiazide 25 Mg Tabs (Hydrochlorothiazide) .Marland Kitchen.. 1 tab daily   Orders Added: 1)  Urinalysis-[facility] [00000] 2)  Urine Culture-[facility] [[id number]] 3)  [facility]- Est Level  3 [99213]    Laboratory Results   Urine Tests  Date/Time Received: December 20, 2010 10:01 AM  Date/Time Reported: December 20, 2010 10:37 AM   Routine Urinalysis   Color: yellow Appearance: Clear Glucose: negative   (Normal Range: Negative) Bilirubin: negative   (Normal Range: Negative) Ketone: negative   (Normal Range: Negative) Spec. Gravity: 1.015   (Normal Range: 1.003-1.035) Blood: trace-intact   (Normal Range: Negative) pH: 5.5   (Normal Range: 5.0-8.0) Protein: negative   (Normal Range: Negative) Urobilinogen: 0.2   (Normal Range: 0-1) Nitrite: negative   (Normal Range: Negative) Leukocyte Esterace:  negative   (Normal Range: Negative)  Urine Microscopic WBC/HPF: 0-3 RBC/HPF: 0-2 Bacteria/HPF: trace Mucous/HPF: 1+ Epithelial/HPF: occ    Comments: ...........test performed by...........Marland KitchenTerese Door, CMA

## 2011-02-01 NOTE — Progress Notes (Signed)
Summary: [facility]/Pt hgb 10.3   Lori Huerta with [facility] program [phone]) called to let us know patient came in to enroll in the [facility] program for her preganancy.  They checked her hgb and it was 10.3.  They are required to call the pts. MD if hgb is below 11.  Lori Huerta has a regular scheduled OB vist 11/02/10.  [name]  October 30, 2010 11:41 AM  Called and followed up with pt to discuss lab results. Instructed pt to begin taking low dose iron-Ferrous Sulfate 325 mg daily. Pt agreeable to plan. Will reassess at next OB visit.  [name] MD

## 2011-02-01 NOTE — Assessment & Plan Note (Signed)
Summary: New OB   Vital Signs:  Patient profile:   25 year old female Height:      66 inches Weight:      123 pounds Pulse rate:   80 / minute BP sitting:   112 / 74  Allergies: No Known Drug Allergies  Social History: Education:  Automotive engineer; Cosmetology Hepatitis Risk:  no  Physical Exam  General:  alert and well-developed.   Head:  normocephalic and atraumatic.   Eyes:  vision grossly intact.   Ears:  R ear normal and L ear normal.   Nose:  no external deformity.   Mouth:  good dentition.   Neck:  supple and full ROM.   Lungs:  normal respiratory effort and normal breath sounds.   Heart:  normal rate, regular rhythm, no murmur, and no gallop.   Abdomen:  soft, non-tender, and normal bowel sounds.   Genitalia:  normal introitus, no external lesions, no vaginal discharge, and no vaginal or cervical lesions.   Msk:  normal ROM.   Extremities:  No clubbing, cyanosis, edema, or deformity noted with normal full range of motion of all joints.   Neurologic:  alert & oriented X3, cranial nerves II-XII intact, and strength normal in all extremities.   Skin:  turgor normal.   Psych:  normally interactive, good eye contact, and not anxious appearing.     Impression & Recommendations:  Problem # 1:  PREGNANCY, MULTIGRAVIDA (ICD-V22.1) 25 YO [id number] here 7 3/7 weeks  for new OB visit. Pt denies any N/V, Abd pain, vaginal bleeding or vaginal discharge. Pt sure of LMP. No major concerns per mom. Pregnancy unexpected per mom, however mom and dad are excited about new child.  Serologies: H&H-13.7/39.6, B+, Ab neg, [place] neg, RPR NR, RI, HBsAg neg, HIV neg Plan to obtain pap smear, GC chlamydia, and urine culture. Intriduced idea of genetic screening at this visit. Plan to followup at next prenatal visit.  Orders: Urine Culture-[facility] ([id number]) GC/Chlamydia-[facility] ([id number]) Pap Smear-[facility] ([id number]) [facility]- Est Level  3 (46962)  Complete Medication List: 1)  Prenatal Vitamins  .... 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 : Yes    Menarche: 10 years    Menses interval: 3 days    Menstrual flow 28 days    On BCP's at conception: no    Date of positive (+) home preg. test: 04/17/2010    Symptoms since LMP: amenorrhea, nausea, vomiting, fatigue, tender breasts   Genetic History    Genetic History Reviewed:     Thalassemia:     mother: no    Neural tube defect:  mother: no    Down's Syndrome:   mother: no    Tay-Sachs:     mother: no    Sickle Cell Dz/Trait:   mother: no    Hemophilia:     mother: no    Muscular Dystrophy:   mother: no    Cystic Fibrosis:   mother: no    Huntington's Dz:   mother: no    Mental Retardation:   mother: no    Fragile X:     mother: no    Other Genetic or       Chromosomal Dz:   mother: no    Child with other       birth defect:     mother: no    > 3 spont. abortions:   mother: no    Hx of stillbirth:     mother: no  Infection Risk History    High Risk Hepatitis B: no    Immunized against Hepatitis B: yes    Exposure to TB: no    Patient with history of Genital Herpes: no    Sexual partner with history of Genital Herpes: no    History of STD (GC, Chlamydia, Syphilis, HPV): yes    Specific STD: Chlamydia    Rash, Viral, or Febrile Illness since LMP: no     Exposure to Cat Litter: no    Chicken Pox Immune Status: Hx of Disease: Immune    History of Parvovirus (Fifth Disease): no    Occupational Exposure to Children: other  Environmental Exposures    Xray Exposure since LMP: no    Chemical or other exposure: yes       Agent(s): cleaning products at restaraunt    Medication, drug, or alcohol use since LMP: no   Past Pregnancy History  Pregnancy # 1    Delivery date:     03/19/2006    Preterm labor:     no    Delivery type:     NSVD    Del[facility]:     [facility]    Infant Sex:     Female    Birth weight:     7'9"    Name:     Lori Huerta  Pregnancy # 2    Comments:     TAB  Pregnancy # 3    Comments:     SAB in 1st trimester   Flowsheet View for Follow-up Visit    Weight:     123    Blood pressure:   112 / 74   Appended Document: New OB EDC 12/15/10 by sure LMP.  Will need close f/u for bacteruria, especially given h/o nephrolithiasis.  Also will need more info about previous pregnancy with preeclampsia.

## 2011-02-01 NOTE — Progress Notes (Signed)
   Phone Note Call from Patient   Caller: Patient Summary of Call: 25 yo pt 36 weeks preganant who states she is having contractions about 5 monutes apart but they do not feel that strong.  Does feel the baby moving, no vaginal discharge or leaking.  Pt states she has not had a lot of water today.  The contraction are a little irregular and are not happening everyy five minutes.   Pt states her other pregnancy was induced so never went through labor. ttold pt to start by drinking water but because she lives in [place] would start packing the car to go to [facility] her if they are regular would go to [facility] immediately.  Pt does have someone who can drive her. Pt agreed.  Initial call taken by: Antoine Primas DO,  November 18, 2010 11:13 PM

## 2011-02-06 NOTE — Discharge Summary (Signed)
  Lori Huerta, Lori Huerta                ACCOUNT NO.:  [id number]  MEDICAL RECORD NO.:  [id number]          PATIENT TYPE:  INP  LOCATION:  9107                          FACILITY:  [facility]  PHYSICIAN:  Catalina Antigua, MD     DATE OF BIRTH:  01-Jul-1986  DATE OF ADMISSION:  12/01/2010 DATE OF DISCHARGE:  12/05/2010                              DISCHARGE SUMMARY   DISCHARGE DIAGNOSES: 1. Intrauterine pregnancy at 38-6/7 weeks' gestation. 2. Repetitive severe variable decelerations. 3. Fetal bradycardic episodes. 4. Postpartum hypertension.  DISCHARGE MEDICATIONS: 1. Hydrochlorothiazide Lori mg p.o. daily. 2. Prenatal vitamins. 3. Ibuprofen. 4. Depo-Provera shot at time of discharge.  LABORATORY DATA: 1. CBC:  December 05, 2010:  White count 8.5, hemoglobin 9.0, platelet     count 272. 2. BMET, December 05, 2010, with sodium 139, potassium 3.8, chloride     106, CO2 Lori, BUN 10, creatinine 0.56, glucose of 78.  BRIEF HOSPITAL COURSE:  This is Lori Huerta, G3, P1011, who came in to [facility] with spontaneous onset of labor, who was admitted for labor management.  During the course of labor, the patient was noted to have persistent variable decels on fetal monitoring for which the patient was subsequently sent for emergent C-section on December 02, 2010, at which delivered a viable female infant on December 02, 2010, at [phone] with Apgars of 7 and 9.  The patient otherwise had normal postnatal course throughout, otherwise normal postpartum course.  However, it was noted the patient developed blood pressures of 140s on the day on day of discharge to which pre-eclamptic labs were obtained which were essentially negative.  The patient received hydrochlorothiazide prior to discharge which brought back pressures down into normotensive ranges with planned postpartum followup in 6 weeks at the [facility].  Of note, prenatal serologies:  Blood type B positive, antibody negative, hep B  surface antigen negative, rubella immune, HIV negative, RPR nonreactive, and GBS negative.  FOLLOWUP:  The patient [facility] postpartum for routine postpartum check.  The patient was also showed instructed to follow up in 1 to 2 weeks after discharge for a routine blood pressure check as well as pre-eclamptic and hypertensive red flags being discussed at length with the patient prior to discharge.     [name], MD   ______________________________ Catalina Antigua, MD    SN/MEDQ  D:  01/27/2011  T:  01/27/2011  Job:  [id number]  Electronically Signed by [name]  on 02/02/2011 02:Lori:01 PM Electronically Signed by Catalina Antigua  on 02/06/2011 11:10:33 AM

## 2011-02-07 ENCOUNTER — Encounter: Payer: Self-pay | Admitting: *Deleted

## 2011-03-12 LAB — COMPREHENSIVE METABOLIC PANEL
ALT: 14 U/L (ref 0–35)
AST: 19 U/L (ref 0–37)
Alkaline Phosphatase: 106 U/L (ref 39–117)
CO2: 25 mEq/L (ref 19–32)
Calcium: 8.3 mg/dL — ABNORMAL LOW (ref 8.4–10.5)
GFR calc Af Amer: >60 mL/min (ref >60)
GFR calc non Af Amer: >60 mL/min (ref >60)
Potassium: 3.8 mEq/L (ref 3.5–5.1)
Sodium: 139 mEq/L (ref 135–145)
Total Protein: 5.9 g/dL — ABNORMAL LOW (ref 6.0–8.3)

## 2011-03-12 LAB — CBC
HCT: 27.8 % — ABNORMAL LOW (ref 36.0–46.0)
HCT: 31.4 % — ABNORMAL LOW (ref 36.0–46.0)
Hemoglobin: 10.3 g/dL — ABNORMAL LOW (ref 12.0–15.0)
Hemoglobin: 9 g/dL — ABNORMAL LOW (ref 12.0–15.0)
MCH: 27.2 pg (ref 26.0–34.0)
MCHC: 32.2 g/dL (ref 30.0–36.0)
MCHC: 32.7 g/dL (ref 30.0–36.0)
MCV: 83.3 fL (ref 78.0–100.0)
MCV: 83.4 fL (ref 78.0–100.0)
Platelets: 247 10*3/uL (ref 150–400)
Platelets: 293 10*3/uL (ref 150–400)
RBC: 3.24 MIL/uL — ABNORMAL LOW (ref 3.87–5.11)
RBC: 3.38 MIL/uL — ABNORMAL LOW (ref 3.87–5.11)
RBC: 3.78 MIL/uL — ABNORMAL LOW (ref 3.87–5.11)
RDW: 16.3 % — ABNORMAL HIGH (ref 11.5–15.5)
RDW: 16.5 % — ABNORMAL HIGH (ref 11.5–15.5)
WBC: 13.4 10*3/uL — ABNORMAL HIGH (ref 4.0–10.5)
WBC: 8.5 10*3/uL (ref 4.0–10.5)

## 2011-03-12 LAB — DIFFERENTIAL
Basophils Relative: 0 % (ref 0–1)
Eosinophils Absolute: 0.1 10*3/uL (ref 0.0–0.7)
Eosinophils Relative: 1 % (ref 0–5)
Lymphs Abs: 1.3 10*3/uL (ref 0.7–4.0)
Monocytes Relative: 6 % (ref 3–12)

## 2011-03-13 LAB — URINE MICROSCOPIC-ADD ON

## 2011-03-13 LAB — WET PREP, GENITAL

## 2011-03-13 LAB — URINALYSIS, ROUTINE W REFLEX MICROSCOPIC
Nitrite: NEGATIVE
Specific Gravity, Urine: >1.03 — ABNORMAL HIGH (ref 1.005–1.030)
Urobilinogen, UA: 2 mg/dL — ABNORMAL HIGH (ref 0.0–1.0)

## 2011-03-14 ENCOUNTER — Encounter: Payer: Self-pay | Admitting: Family Medicine

## 2011-03-14 ENCOUNTER — Ambulatory Visit (INDEPENDENT_AMBULATORY_CARE_PROVIDER_SITE_OTHER): Payer: Self-pay | Admitting: Family Medicine

## 2011-03-14 DIAGNOSIS — Z30431 Encounter for routine checking of intrauterine contraceptive device: Secondary | ICD-10-CM

## 2011-03-15 NOTE — Progress Notes (Signed)
  Subjective:    Patient ID: Lori Huerta, female    DOB: 1986-12-27, 25 y.o.   MRN: [id number]  HPI Pt here for follow up on IUD. IUD was placed on 01/22/11. Pt denies any worsening bleeding, abdominal pain, fever, or discharge since placement. Pt states that IUD strings have been affecting intercourse detrementally. Pt's spouse (her and boyfriend recently married) states that strings have made intercourse very painful. Pt and spouse are wondering if strings can be cut/trimmed.     Review of Systems     Objective:   Physical Exam Gen: NAD GYN: normal external genitalia, no discharge, IUD strings present around cervix-->approx 1cm string present.        Assessment & Plan:   IUD: pt instructed that strings will be unable to cut given proximity to cervix. Discussed other BC options including implanon, depo shot, and OCPs. Pt and spouse states that they will try to adjust to strings. Pt instructed to follow up in 6 weeks for reassessment. Pt and spouse agreeable to plan.

## 2011-03-16 LAB — BASIC METABOLIC PANEL
Calcium: 8.7 mg/dL (ref 8.4–10.5)
Chloride: 106 mEq/L (ref 96–112)
Creatinine, Ser: 0.58 mg/dL (ref 0.4–1.2)
GFR calc Af Amer: >60 mL/min (ref >60)

## 2011-03-16 LAB — URINE MICROSCOPIC-ADD ON

## 2011-03-16 LAB — CBC
MCV: 98.9 fL (ref 78.0–100.0)
Platelets: 307 10*3/uL (ref 150–400)
RBC: 3.66 MIL/uL — ABNORMAL LOW (ref 3.87–5.11)
WBC: 10.3 10*3/uL (ref 4.0–10.5)

## 2011-03-16 LAB — URINALYSIS, ROUTINE W REFLEX MICROSCOPIC
Glucose, UA: NEGATIVE mg/dL
Leukocytes, UA: NEGATIVE
Protein, ur: NEGATIVE mg/dL
Specific Gravity, Urine: 1.015 (ref 1.005–1.030)
pH: 7 (ref 5.0–8.0)

## 2011-03-16 LAB — DIFFERENTIAL
Lymphocytes Relative: 12 % (ref 12–46)
Lymphs Abs: 1.2 10*3/uL (ref 0.7–4.0)
Neutrophils Relative %: 82 % — ABNORMAL HIGH (ref 43–77)

## 2011-03-16 LAB — URINE CULTURE: Colony Count: >=100000

## 2011-03-18 LAB — DIFFERENTIAL
Basophils Absolute: 0 10*3/uL (ref 0.0–0.1)
Basophils Relative: 0 % (ref 0–1)
Eosinophils Relative: 0 % (ref 0–5)
Lymphocytes Relative: 11 % — ABNORMAL LOW (ref 12–46)
Neutro Abs: 11.4 10*3/uL — ABNORMAL HIGH (ref 1.7–7.7)

## 2011-03-18 LAB — BASIC METABOLIC PANEL
BUN: 8 mg/dL (ref 6–23)
Calcium: 9.9 mg/dL (ref 8.4–10.5)
GFR calc non Af Amer: >60 mL/min (ref >60)
Glucose, Bld: 91 mg/dL (ref 70–99)

## 2011-03-18 LAB — URINALYSIS, ROUTINE W REFLEX MICROSCOPIC
Leukocytes, UA: NEGATIVE
Nitrite: POSITIVE — AB
Specific Gravity, Urine: <1.005 — ABNORMAL LOW (ref 1.005–1.030)
pH: 6.5 (ref 5.0–8.0)

## 2011-03-18 LAB — URINE MICROSCOPIC-ADD ON

## 2011-03-18 LAB — CBC
HCT: 42.3 % (ref 36.0–46.0)
MCHC: 34.8 g/dL (ref 30.0–36.0)
Platelets: 310 10*3/uL (ref 150–400)
RDW: 13.6 % (ref 11.5–15.5)

## 2011-03-18 LAB — PREGNANCY, URINE: Preg Test, Ur: NEGATIVE

## 2011-03-19 LAB — URINALYSIS, ROUTINE W REFLEX MICROSCOPIC
Glucose, UA: NEGATIVE mg/dL
Ketones, ur: NEGATIVE mg/dL
Leukocytes, UA: NEGATIVE
Nitrite: NEGATIVE
Nitrite: NEGATIVE
Specific Gravity, Urine: >1.03 — ABNORMAL HIGH (ref 1.005–1.030)
Specific Gravity, Urine: >1.03 — ABNORMAL HIGH (ref 1.005–1.030)
Urobilinogen, UA: 1 mg/dL (ref 0.0–1.0)
Urobilinogen, UA: 1 mg/dL (ref 0.0–1.0)
Urobilinogen, UA: 1 mg/dL (ref 0.0–1.0)
pH: 7 (ref 5.0–8.0)
pH: 5.5 (ref 5.0–8.0)

## 2011-03-19 LAB — URINE MICROSCOPIC-ADD ON

## 2011-03-19 LAB — DIFFERENTIAL
Basophils Absolute: 0 10*3/uL (ref 0.0–0.1)
Basophils Relative: 0 % (ref 0–1)
Eosinophils Relative: 1 % (ref 0–5)
Lymphocytes Relative: 18 % (ref 12–46)
Lymphs Abs: 2 10*3/uL (ref 0.7–4.0)
Neutro Abs: 8 10*3/uL — ABNORMAL HIGH (ref 1.7–7.7)
Neutro Abs: 8.4 10*3/uL — ABNORMAL HIGH (ref 1.7–7.7)
Neutrophils Relative %: 79 % — ABNORMAL HIGH (ref 43–77)

## 2011-03-19 LAB — URINE CULTURE: Colony Count: 4000

## 2011-03-19 LAB — COMPREHENSIVE METABOLIC PANEL
AST: 18 U/L (ref 0–37)
Albumin: 4.1 g/dL (ref 3.5–5.2)
BUN: 13 mg/dL (ref 6–23)
CO2: 23 mEq/L (ref 19–32)
Calcium: 9.4 mg/dL (ref 8.4–10.5)
Creatinine, Ser: 0.55 mg/dL (ref 0.4–1.2)
GFR calc Af Amer: >60 mL/min (ref >60)
GFR calc non Af Amer: >60 mL/min (ref >60)

## 2011-03-19 LAB — CBC
MCHC: 35.6 g/dL (ref 30.0–36.0)
MCHC: 36 g/dL (ref 30.0–36.0)
MCV: 93.2 fL (ref 78.0–100.0)
Platelets: 310 10*3/uL (ref 150–400)
RBC: 4.02 MIL/uL (ref 3.87–5.11)
RDW: 13.2 % (ref 11.5–15.5)

## 2011-03-19 LAB — BASIC METABOLIC PANEL
CO2: 23 mEq/L (ref 19–32)
Calcium: 9.3 mg/dL (ref 8.4–10.5)
Creatinine, Ser: 0.5 mg/dL (ref 0.4–1.2)
GFR calc Af Amer: >60 mL/min (ref >60)

## 2011-03-19 LAB — ABO/RH: ABO/RH(D): B POS

## 2011-03-21 LAB — URINE MICROSCOPIC-ADD ON

## 2011-03-21 LAB — URINALYSIS, ROUTINE W REFLEX MICROSCOPIC
Bilirubin Urine: NEGATIVE
Glucose, UA: NEGATIVE mg/dL
Glucose, UA: NEGATIVE mg/dL
Ketones, ur: 15 mg/dL — AB
Ketones, ur: NEGATIVE mg/dL
Urobilinogen, UA: 1 mg/dL (ref 0.0–1.0)
pH: 5.5 (ref 5.0–8.0)

## 2011-03-21 LAB — WET PREP, GENITAL
Clue Cells Wet Prep HPF POC: NONE SEEN
Trich, Wet Prep: NONE SEEN
Yeast Wet Prep HPF POC: NONE SEEN

## 2011-03-21 LAB — GC/CHLAMYDIA PROBE AMP, GENITAL: GC Probe Amp, Genital: NEGATIVE

## 2011-04-17 LAB — URINALYSIS, ROUTINE W REFLEX MICROSCOPIC
Bilirubin Urine: NEGATIVE
Nitrite: NEGATIVE
Protein, ur: NEGATIVE mg/dL
Specific Gravity, Urine: <1.005 — ABNORMAL LOW (ref 1.005–1.030)
Urobilinogen, UA: 2 mg/dL — ABNORMAL HIGH (ref 0.0–1.0)

## 2011-04-17 LAB — URINE MICROSCOPIC-ADD ON

## 2011-04-17 LAB — PREGNANCY, URINE: Preg Test, Ur: NEGATIVE

## 2011-05-04 ENCOUNTER — Other Ambulatory Visit: Payer: Self-pay | Admitting: Family Medicine

## 2011-05-04 NOTE — Telephone Encounter (Signed)
Refill request

## 2011-05-06 ENCOUNTER — Emergency Department (HOSPITAL_COMMUNITY): Payer: Medicaid Other

## 2011-05-06 ENCOUNTER — Emergency Department (HOSPITAL_COMMUNITY)
Admission: EM | Admit: 2011-05-06 | Discharge: 2011-05-06 | Disposition: A | Discharge status: Home / Self Care | Payer: Medicaid Other | Attending: Emergency Medicine | Admitting: Emergency Medicine

## 2011-05-06 DIAGNOSIS — F411 Generalized anxiety disorder: Secondary | ICD-10-CM | POA: Insufficient documentation

## 2011-05-06 DIAGNOSIS — R109 Unspecified abdominal pain: Secondary | ICD-10-CM | POA: Insufficient documentation

## 2011-05-06 DIAGNOSIS — N390 Urinary tract infection, site not specified: Secondary | ICD-10-CM | POA: Insufficient documentation

## 2011-05-06 LAB — URINALYSIS, ROUTINE W REFLEX MICROSCOPIC
Glucose, UA: NEGATIVE mg/dL
Leukocytes, UA: NEGATIVE
Nitrite: NEGATIVE
Specific Gravity, Urine: >1.03 — ABNORMAL HIGH (ref 1.005–1.030)
pH: 6.5 (ref 5.0–8.0)

## 2011-05-06 LAB — URINE MICROSCOPIC-ADD ON

## 2011-05-06 LAB — POCT PREGNANCY, URINE: Preg Test, Ur: NEGATIVE

## 2011-05-08 LAB — URINE CULTURE
Colony Count: >=100000
Culture  Setup Time: 201205072000

## 2011-05-18 NOTE — Discharge Summary (Signed)
NAME:  Lori Huerta, Lori Huerta                ACCOUNT NO.:  [id number]   MEDICAL RECORD NO.:  [id number]           PATIENT TYPE:   LOCATION:                                 FACILITY:   PHYSICIAN:  Lesly Dukes, M.D.      DATE OF BIRTH:   DATE OF ADMISSION:  01/20/2006  DATE OF DISCHARGE:  01/22/2006                                 DISCHARGE SUMMARY   REASON FOR ADMISSION:  Uncontrolled pain in pregnant woman with known  history of kidney stones and questionable pyelonephritis.   DISCHARGE DIAGNOSES:  1.  29-week intrauterine pregnancy.  2.  History of renal stones with moderate right hydronephrosis.  3.  Iron deficiency anemia.   IMAGING:  Renal ultrasound on January 20, 2006 showed moderate right  hydronephrosis.  No renal stone identified.  Right kidney measured 12.1 cm.  Left kidney measured 10.4 cm.  Repeat renal ultrasound on January 21, 2006  did show bilateral ureteral jets.   LABORATORIES:  Admit hemoglobin 8.1, WBC 10.1.  Catheterized urine 1.015,  trace hemoglobin, trace leukocyte esterase, few epithelials, 3-6 wbc's, few  bacteria.  Blood cultures are pending at the time of this dictation.   HOSPITAL COURSE:  Patient is a 25 year old G1, P0 at 68 and 5 weeks dated by  an LMP consistent with a 13-week ultrasound who presented with a four-day  history of right flank pain that was colicky in nature and was resistant to  Percocet.  She reported that she had been hospitalized in November at [facility] when she was diagnosed with a kidney stone.  On review of the record  apparently she had a 6 mm stone that was non-obstructive in the right  superior pelvic junction.  She was treated with IV Ancef.  Since then she  has been followed by her primary care physician and by Dr. Vernie Ammons, a  urologist.  Dr. Tressia Danas reports that they were going to continue to  observe her and only stent her if the hydronephrosis got significantly worse  or if she became obstructed.  She was also  placed on Macrobid  prophylactically in November, that she has been off that for the past month.  During the hospitalization patient was found to be anemic.  Her initial  hemoglobin was 14 but on admission here it was 8.1.  Anemia work-up was  performed.  Her total iron was low at 28, TIBC was high at 495, percent  saturation was low at 6%.  B12 and folate were within normal limits and  ferritin was also low at 4.  Sickle cell screen has been negative in the  past.  In addition, her MCV was 83.5.  Still, the patient was iron deficient  anemia and she was started on iron and Colace.  Patient admitted that she  had had craving for uncooked oatmeal, but had not eaten any non-food  products such as dirt.   On admission patient was found to be afebrile with a normal white blood cell  count.  She has some right-sided abdominal pain, but never right CVA  tenderness.  Imaging was done and results are as described above.  She was  admitted, started on Dilaudid for pain control.  Her urine was also  questionable so she was started on Rocephin 1 g q.12h.  A urine culture was  sent and ordered by the physician; however, it was never actually performed  in the laboratory.  By the end of hospital day one patient was able to be  switched from the Dilaudid over to oral pain medicine and she did very well.  Patient was determined to be stable for discharge under follow-up high risk  clinic on January 30, 2006 at 10 a.m. and with urologist, Dr. Vernie Ammons, on  Wednesday, January 23, 2006 at 9 a.m.  Please note that patient was given a  copy of her imaging and the discharge summary was discharged stat so that it  could be reviewed by the urologist and he could decide whether or not  stenting was needed or prophylactic antibiotics were needed.   MEDICATIONS:  1.  Percocet 5 one p.o. q.4h. p.r.n. pain.  2.  Colace 100 mg p.o. b.i.d.  3.  Iron 325 one tablet p.o. b.i.d.  4.  Prenatal vitamins one p.o.  daily.   DISCHARGE INSTRUCTIONS:  Patient is to call if any questions or concerns.  If her pain worsens she is also to notify us.   DIET:  Regular.   ACTIVITY:  As tolerated.     ______________________________  Marc Morgans. Mayford Knife, M.D.    ______________________________  Lesly Dukes, M.D.    TLW/MEDQ  D:  01/22/2006  T:  01/22/2006  Job:  [id number]

## 2011-05-18 NOTE — Discharge Summary (Signed)
NAME:  Lori Huerta, Lori Huerta                ACCOUNT NO.:  [id number]   MEDICAL RECORD NO.:  [id number]          PATIENT TYPE:  OBV   LOCATION:  5711                         FACILITY:  [facility]   PHYSICIAN:  Zenaida Deed. Mayford Knife, M.D.DATE OF BIRTH:  1986-04-28   DATE OF ADMISSION:  09/10/2006  DATE OF DISCHARGE:  09/11/2006                                 DISCHARGE SUMMARY   PRIMARY CARE PHYSICIAN:  Sharin Huerta, M.D. of the family practice  center.   DISCHARGED DIAGNOSES:  1. Status post elective abortion.  2. Acute blood loss anemia.  3. Abdominal pain.   PROCEDURES:  1. Radiology:  Abdominal ultrasound showed a thickened endometrium with      small amount of free fluid.  Her right ovary was normal.  The left      ovary was not visualized.  __________  .  2. __________  packed red blood cells.   LABORATORY DATA:  CBC on admission, white blood cell count 9.1, hemoglobin  7.6, hematocrit __________  platelets  265 .  Urinalysis negative.  PT, PTT,  and INR all normal.  __________  normal.  Hemoglobin and hematocrit at  discharge were 10.2 and 30.4, respectively.   HISTORY OF PRESENT ILLNESS:  Please see admitting history and physical for  full details.  In summary, this is a 25 year old, gravida 2, para 1-0-1-1  who underwent elective abortion on September 04, 2006, for undesired  pregnancy.  She presented to the ER after she developed increased abdominal  pain as well as increased vaginal bleeding.  In the emergency department,  she had an ultrasound which did not show any retained products of  conception.  Her hemoglobin was low at 7.6.  The patient was having  increasing weakness over the past week  finding it difficult to find  strength to do her job.  The patient was admitted to the family practice  teaching service for 23-hour observation for  __________   HOSPITAL COURSE:  The patient was  admitted for observation .  She was typed  and crossed, and transfused 2 units packed red  blood cells.  She tolerated  the transfusion well.  The patient's vaginal bleeding slowed down  greatly  in the emergency department prior to admission and remained  minimal  throughout her hospital stay.  The patient's abdominal pain and cramping  also resolved during the hospital stay.  At the time of discharge, the  patient was without any __________  pain or abdominal pain on physical exam.  The patient's hemoglobin at the time of discharge was 10.2.  The patient was  discharged home with close follow-up her primary care physician, Dr. Alfonse Ras-  Coll.   DISCHARGE MEDICATIONS:  1. Vicodin 5/500, one tablet q.6 h. p.r.n. pain.  2. Ferrous sulfate 325 mg, one tablet p.o. b.i.d.  3. Colace 100 mg p.o. every day p.r.n.   ACTIVITIES:  The patient is to increase activity slowly.   FOLLOWUP APPOINTMENT:  The patient is to follow up with Lori Huerta at  the family practice center, phone number is. [phone] in the next 2-3  days. She  should call for appointment.   FOLLOWUP ISSUES:  1. Anemia, check hemoglobin.  __________  .  2. __________  .      Benn Moulder, M.D.    ______________________________  Zenaida Deed. Mayford Knife, M.D.    MR/MEDQ  D:  09/11/2006  T:  09/11/2006  Job:  [id number]

## 2011-05-18 NOTE — H&P (Signed)
NAME:  Lori Huerta, Lori Huerta                ACCOUNT NO.:  [id number]   MEDICAL RECORD NO.:  [id number]          PATIENT TYPE:  OBV   LOCATION:  1833                         FACILITY:  [facility]   PHYSICIAN:  Zenaida Deed. Mayford Knife, M.D.DATE OF BIRTH:  1986/03/17   DATE OF ADMISSION:  09/10/2006  DATE OF DISCHARGE:                                HISTORY & PHYSICAL   PRIMARY CARE PHYSICIAN:  Sharin Grave, M.D.   HISTORY OF PRESENT ILLNESS:  The patient is a 25 year old, gravida 2, para 1-  0-1-1, who underwent an elective abortion on September 04, 2006, for an  undesired pregnancy.  She has had vaginal cramping and bleeding after the  procedure which had been improving until last night when she developed  severe cramps which she rates as 8/10 intensity as well as some increased  vaginal bleeding going through about 1 pad per hour.  She does note that her  bleeding last night was less than it had been immediately following the  procedure.  She also has noted an occasional low grade fever to 100 degrees  Fahrenheit since the procedure.  This has been relieved with Tylenol.  Her  last questionable fever was yesterday afternoon.  She also notes that she  has been feeling increasingly weak since the procedure.  She finds it  difficult to do her job working at [employer].  In the emergency room, the  patient has been given Dilaudid which has greatly improved her pain.  Her  bleeding also has slowed down considerably since when she first presented to  the emergency department.   REVIEW OF SYSTEMS:  Patient with positive intermittent low-grade fevers.  The patient denies any chest pain, shortness of breath, or palpitations.  The patient does note occasional nausea but no vomiting or diarrhea.  She  denies any dysuria or urinary frequency.  No skin rashes.  The patient does  have positive weakness, positive lightheadedness.   PAST MEDICAL HISTORY:  1. Anxiety.  2. Allergic rhinitis.  3. Pre-eclampsia  with forceps delivery in March 2007.  4. Nephrolithiasis with renal ultrasound showing a 6-mm nonobstructive      stone.   FAMILY HISTORY:  Noncontributory.   SOCIAL HISTORY:  The patient currently lives in [place].  She is working  at [employer].  She has 1 son who was born March 19, 2006.  She notes  occasional alcohol use, although none recently.  She denies any tobacco or  drug use.   PHYSICAL EXAMINATION:  VITAL SIGNS:  Temperature 97.9, pulse 78, blood  pressure 104/64, respiratory rate 18, 100% on room air, orthostatics were  within normal limits.  GENERAL APPEARANCE:  No acute distress.  HEENT:  Normocephalic atraumatic.  Sclerae are pale.  Mucous membranes are  moist.  Throat is without erythema or exudate.  NECK:  No carotid bruits.  No thyromegaly or thyroid nodules.  No anterior  posterior cervical lymphadenopathy.  LUNGS:  Clear to auscultation bilaterally.  HEART:  Regular rate and rhythm.  No murmurs, rubs, or gallops.  ABDOMEN:  Positive bowel sounds.  The patient has mild  diffuse tenderness to  palpation across her lower abdomen.  She denies any rebound tenderness or  guarding.  No CVA tenderness.  EXTREMITIES:  No cyanosis, clubbing, or edema.  Radial and dorsalis pedis  pulses 2+.  PELVIC:  A sterile speculum exam showed that the cervical os is almost  completely closed.  There is a scant amount of dark blood.  There is no  purulent discharge or other signs of infection.  NEUROLOGIC:  Alert and oriented  x3.  Cranial nerves II-XII grossly intact.  The patient is with 5/5 strength and 2+ deep tendon reflexes.  The patient  with normal gait.  Negative Romberg.   LABORATORY DATA:  White blood cell count 9.1, hemoglobin 7.6.  Of note, the  patient's hemoglobin was 10.1 on September 02, 2006; it was 7.8 on March 21, 2006 at discharge from [facility].  Hematocrit 23.0, platelet count  399, MCV 73.3, 80% neutrophils.  Sodium 141, potassium 4.0, chloride 110,   bicarb 26, BUN 11, creatinine 0.6, glucose 93.  Total protein 6.2, calcium  8.7, albumin 2.7, AST 16, ALT 17, alk phos 74, total bilirubin 0.5.  Abdominal ultrasound showed thickened endometrium with a small amount of  free fluid.  Right ovary was negative.  Left ovary was not visualized.  Uterine parenchyma was unremarkable.   ASSESSMENT:  A 25 year old female status post abortion, now with continued  abdominal pain, cramping, and anemia.   PLAN:  1. Anemia.  The patient is symptomatic with feelings of weakness.  This is      due to acute blood loss anemia.  We will admit the patient for 23-hour      observation and transfuse 2 units of packed red blood cells.  She will      need to be discharged home on iron as she likely has iron-deficiency      anemia with her MCV of 73.  I discussed with the patient's primary care      physician.  Iron studies have already been done as an outpatient, so we      will not repeat any further studies here.  2. Abdominal pain.  Cramping likely secondary to her recent abortion.  Her      ultrasound does not show any retained products of conception and white      blood cell count is normal, so no concern at this point for      endometritis or retained products of conception.  We will provide      symptomatic care with Vicodin for now.  She may need followup with an      OB/GYN if her symptoms do not improved in the next few days.   DISPOSITION:  The patient will likely be discharged home after transfusion  of 2 units of packed red blood cells if her symptoms remain well controlled.      Benn Moulder, M.D.    ______________________________  Zenaida Deed. Mayford Knife, M.D.    MR/MEDQ  D:  09/10/2006  T:  09/10/2006  Job:  [id number]

## 2011-05-18 NOTE — H&P (Signed)
NAME:  Lori Huerta, Lori Huerta                ACCOUNT NO.:  [id number]   MEDICAL RECORD NO.:  [id number]          PATIENT TYPE:  OIB   LOCATION:  LDR1                          FACILITY:  [facility]   PHYSICIAN:  Tilda Burrow, M.D. DATE OF BIRTH:  [birth year]   DATE OF ADMISSION:  11/07/2005  DATE OF DISCHARGE:  LH                                HISTORY & PHYSICAL   REASON FOR ADMISSION:  Pregnancy at 19 weeks and 3 days with severe sudden  onset of right flank pain.  Lori Huerta said she woke up this morning and was  having some naggy pain, but this afternoon it proceeded to be severe.  She  attempted to make it to [facility] where she receives her prenatal  care, but was so uncomfortable she could not drive and came here.  We have  sent for release of records from [facility].   MEDICAL HISTORY:  Negative.   SURGICAL HISTORY:  Negative.   OBSTETRICAL HISTORY:  She is a gravida 1, para 0, EDC March 30, 2006 at 44  and 3 weeks.   PHYSICAL EXAMINATION:  VITAL SIGNS:  Stable.  PELVIC:  Cervix is firm, closed, posterior; the seen part is high.  Fetal  heart rate is stable.  She is having some irritability type pattern, which  is mild, that she is unaware of.  She says on a scale of 1 to 10, her pain  scale is a 9.  GENERAL:  She appears very uncomfortable in the bed.  She is also vomiting.  At the present time, she has about 300 cc of gastric secretions in the  basin.   ASSESSMENT:  Urinary tract infection versus pyelonephritis versus kidney  stone.   PLAN:  1.  Pain management.  2.  A renal ultrasound.  3.  IV and anti-emetics.  4.  Consult Dr. Despina Hidden.      Lori Huerta, Lori Huerta      Tilda Burrow, M.D.  Electronically Signed    DL/MEDQ  D:  69/[id number]/[id number]  T:  11/07/2005  Job:  [id number]   cc:   [facility]

## 2011-05-18 NOTE — Discharge Summary (Signed)
NAME:  NOEL, HENANDEZ                ACCOUNT NO.:  [id number]   MEDICAL RECORD NO.:  [id number]          PATIENT TYPE:  OIB   LOCATION:  A426                          FACILITY:  [facility]   PHYSICIAN:  Ky Barban, M.D.DATE OF BIRTH:  06-04-1986   DATE OF ADMISSION:  11/07/2005  DATE OF DISCHARGE:  LH                                 DISCHARGE SUMMARY   CHIEF COMPLAINT:  Right flank pain.   HISTORY OF PRESENT ILLNESS:  A 25 year old lady who was referred to me,  admitted to the hospital with right flank pain that started yesterday.  The  patient became worse and was colic-like pain, localized to right flank.  No  nausea, vomiting, fevers or chills.  No voiding difficulty.  She denies  having similar pain in the past.  No history of kidney stones.  She is [redacted]  weeks pregnant.  Admitted without UA and because of continuous pain.  Since  this morning, she has no pain.  She is feeling better.  She was started on  IV fluids and IV Ancef after doing a urine culture.   PAST MEDICAL HISTORY:  Negative.   FAMILY HISTORY:  Negative.  No history of kidney stones.   PHYSICAL EXAMINATION:  GENERAL APPEARANCE:  Fully conscious, alert, not in  any acute distress.  VITAL SIGNS:  Blood pressure 120/80, temperature normal.  ABDOMEN:  Soft, flat.  Liver, spleen, kidneys not palpable.  CVA tenderness  1+.  Gravid uterus.  PELVIS:  Deferred.  EXTREMITIES:  Normal.   LABORATORY DATA:  Urinalysis shows 11-20 WBCs, 21-50 RBCs, WBC count is  13.7.  Renal ultrasound was done that revealed mild hydronephrosis on the  left side, probably from pregnancy.  There is mild hydronephrosis on the  right side.  A 6 mm stone in the right ureteropelvic junction.   IMPRESSION:  Right renal calculus.  It is definitely not causing significant  obstruction.  Pain is not there.  There is no fever.  However, pain is under  control.  She can be discharged in the morning.  If the pain comes back and  does not get control  with medication or she does not have any fever, then I  will consider inserting JJ stent.  She goes to  [facility] for her [facility] care.  I have told her that maybe she can let  her doctor know over there in case she has emergency situations so they will  know she has a stone in the right kidney and needs a stent.   I appreciate Dr. Emelda Fear letting us see this patient.      Ky Barban, M.D.  Electronically Signed     MIJ/MEDQ  D:  11/08/2005  T:  11/09/2005  Job:  [id number]   cc:   Tilda Burrow, M.D.  Fax: [phone]

## 2011-05-18 NOTE — Op Note (Signed)
NAME:  Lori Huerta, Lori Huerta                ACCOUNT NO.:  [id number]   MEDICAL RECORD NO.:  [id number]          PATIENT TYPE:  INP   LOCATION:  9374                          FACILITY:  [facility]   PHYSICIAN:  Phil D. Okey Dupre, M.D.     DATE OF BIRTH:  [birth year]   DATE OF PROCEDURE:  03/19/2006  DATE OF DISCHARGE:                                 OPERATIVE REPORT   OBSTETRICIAN:  Dr. Okey Dupre.   ANESTHESIA:  Epidural plus pudendal block.   ESTIMATED BLOOD LOSS:  400 mL.   PROCEDURE:  Low forceps delivery, midline episiotomy repaired.   POSTOPERATIVE CONDITION:  Satisfactory.   DESCRIPTION OF PROCEDURE:  Under satisfactory epidural anesthesia  supplemented by 10 mL 1% Xylocaine pudendal block on each side. Luikhart-  McLean forceps were applied on the vertex which was at +4 in LOP  presentation and the baby was delivered over midline episiotomy. The blood  taken from the placenta. The cord was doubly clamped, divided. The baby  handed to the nurse. Samples of blood taken from the cord for analysis and  the placenta spontaneously removed. The vagina explored. No lacerations were  noted and episiotomy was closed with a 2-0 chromic catgut suture running  locked from the apex to the mucocutaneous junction just beyond the hymenal  ring, then brought out to the perineum in running two layers after the base  of layer of interrupted 2-0 chromic catgut suture was used to reinforce the  perineal body. The skin to skin edge closure of the skin from the lower  portion up to the mucocutaneous junction.           ______________________________  Javier Glazier Okey Dupre, M.D.     PDR/MEDQ  D:  03/19/2006  T:  03/20/2006  Job:  [id number]

## 2011-05-18 NOTE — H&P (Signed)
NAME:  KADE, RICKELS                ACCOUNT NO.:  [id number]   MEDICAL RECORD NO.:  [id number]          PATIENT TYPE:  OBV   LOCATION:  A332                          FACILITY:  [facility]   PHYSICIAN:  Lonia Blood, M.D.       DATE OF BIRTH:  1986-07-20   DATE OF ADMISSION:  12/12/2006  DATE OF DISCHARGE:  LH                              HISTORY & PHYSICAL   PRIMARY CARE PHYSICIAN:  With the family practice teaching service.   CHIEF COMPLAINT:  Abdominal pain.   HISTORY OF PRESENT ILLNESS:  Mrs. Adel is a 25 year old woman without  any significant past medical history who presented to [facility]  emergency room tonight with complaints of subacute onset of severe  abdominal pain.  The patient reports that the pain is 10/10, located to  the epigastrium, radiating a little bit into the left upper quadrant and  also to the back.  She reports also significant nausea but no vomiting.  The patient is saying that the pain actually started 24 hours prior to  admission and it has been constant, throbbing.  She attempted to eat  some pizza today, but the pain continued to be significant.   PAST MEDICAL HISTORY:  1. Nephrolithiasis.  2. Anxiety.  3. Allergic rhinitis.  4. Hemorrhoids.  5. Preeclampsia.  6. Elective abortions September 2007, followed by significant vaginal      bleeding and requiring transfusion of two units of packed red blood      cells.   HOME MEDICATIONS:  Ortho-Tri-Cyclen.   SOCIAL HISTORY:  The patient is single, lives with her boyfriend.  She  works at [employer].  She does not smoke cigarettes.  Does not use any  illicit drugs.  She drinks alcoholic only occasionally.   FAMILY HISTORY:  The patient's mother is alive with lung cancer.  The  patient's father is alive and healthy.  There is no family history for  sickle cell.   REVIEW OF SYSTEMS:  Negative for chest pain.  Negative for shortness of  breath.  Negative for focal weakness and numbness.  Negative for  headaches.  Negative for coughing, fever or other systems per HPI.  All  other systems negative.   PHYSICAL EXAMINATION:  VITAL SIGNS:  Admission temperature 99.0, blood  pressure 117/76, pulse 109, respirations 20s saturation 100% on room  air.  GENERAL APPEARANCE:  Alert, well-developed, well-nourished African  American woman in no acute distress, alert, oriented to place, person  and time.  HEENT:  Head:  Normocephalic, atraumatic.  Eyes:  Pupils equal, round,  react to light accommodation.  Extraocular motion intact.  Conjunctiva  pink.  Sclera anicteric.  Throat clear.  NECK: Supple.  No JVD.  No carotid bruits.  CHEST: Clear to auscultation bilaterally without wheezes, rales,  crackles.  HEART:  Regular rate and rhythm without murmurs, rubs or gallops.  ABDOMEN:  Soft.  There is some minor epigastric tenderness.  No rebound,  no guarding.  Bowel sounds are present.  No palpable hepatosplenomegaly.  GENITOURINARY: There is some mild the costovertebral tenderness on the  left  flank.  There is no suprapubic tenderness.  MUSCULOSKELETAL:  The patient has intact muscle bulk and tone.  NEUROLOGICAL:  Cranial nerves III-XII intact.  Strength 5/5 in all four  extremities.  Sensation is intact.   LABORATORY VALUES:  On admission, white blood cell count 6.7, hemoglobin  13.1, hematocrit 39.5, platelet count of 286.  Urinalysis positive for  nitrite, negative for hemoglobin, positive for bacteria, negative for  red blood cells.  Sodium 138, potassium 4.1, chloride 109, carbon  dioxide 25, BUN 90, creatinine 0.6, albumin 3.7, AST 17, ALT 16,  alkaline phosphatase 84.   ASSESSMENT:  1. New onset abdominal pain in a 25 year old woman with unclear      etiology.  Plan is to admit the patient, observe her overnight.      Obtain ultrasound of the abdomen and rule out gallbladder disease.      Obtain a lipase level to rule out pancreatitis.  I do strongly      suspect, though, the presence of  a gastritis versus ulcer, and I      will start this patient on a proton pump inhibitor.  2. Mild dehydration.  The patient will be started on intravenous      fluids, and she will be observed closely.  3. Urinary tract infection.  The urine culture will be sent.  The      patient will be started empirically on Ciprofloxacin.      Lonia Blood, M.D.  Electronically Signed     SL/MEDQ  D:  12/12/2006  T:  12/13/2006  Job:  [id number]   cc:   [facility] Teaching Service

## 2011-05-18 NOTE — Discharge Summary (Signed)
NAME:  Lori Huerta, Lori Huerta                ACCOUNT NO.:  [id number]   MEDICAL RECORD NO.:  [id number]          PATIENT TYPE:  OBV   LOCATION:  A332                          FACILITY:  [facility]   PHYSICIAN:  Lonia Blood, M.D.      DATE OF BIRTH:  October 13, 1986   DATE OF ADMISSION:  12/12/2006  DATE OF DISCHARGE:  12/16/2007LH                               DISCHARGE SUMMARY   PRIMARY CARE PHYSICIAN:  Family practice teaching service.   DISCHARGE DIAGNOSES:  1. Epigastric pain, probably acute gastritis.  2. Urinary tract infection.  3. Hemorrhoids.  4. Anxiety disorder.  5. Mild dehydration.   DISCHARGE MEDICATIONS:  1. Ciprofloxacin 250 mg twice a day for 5 days.  2. Vicodin 5/500 one tablet every 6 hours as needed.  3. Phenergan 12.5 mg every 6 hours as needed.  4. Protonix 40 mg daily.   DISPOSITION:  The patient still has some mild epigastric pain;  otherwise, she is doing okay and is stable for discharge.  She had some  constipation, and we are giving her some laxatives.  Once she passes  stool, we will let her go home.  She has eaten all her food and is  keeping it down also.   PROCEDURES:  Abdominal ultrasound performed on December 13, 2006, was  essentially negative.   CONSULTATIONS:  None.   BRIEF HISTORY AND PHYSICAL:  Please refer to dictated History and  Physical by Dr. Lonia Blood.  In short, however, the patient is a 25-year-  old female admitted on December 13 with abdominal pain.  The pain was in  the epigastric region, severe, and was rated as 10/10.  In the ER, the  patient was found to have evidence of UTI; otherwise, no other  significant findings.  She was subsequently admitted for further  management.   HOSPITAL COURSE:  #1.  ABDOMINAL PAIN:  This is mainly in the epigastric region and  associated with some nausea.  She was admitted.  The differential then  was gallbladder disease, pancreatitis, or gastritis.  Abdominal  ultrasound and lab work including lipase were  normal, indicating that  this is most likely gastritis related.  She was placed on Protonix and  pain control.  That has improved gradually.  Her nausea was also  controlled using some Phenergan.  At this point, she will continue with  some of these medications for at least another week or so.   #2.  URINARY TRACT INFECTION:  The patient was on IV Cipro and now on  oral Cipro for her UTI.  Urine culture has been sent and is so far still  pending.  We will follow it up for the results.  Other tests done were  H. pylori which came back as negative at 0.5.   #3.  ANXIETY:  The patient has been anxious but currently looks stable.  No medications were required for that.  Otherwise, she is stable from  our standpoint.      Lonia Blood, M.D.  Electronically Signed     LG/MEDQ  D:  12/15/2006  T:  12/15/2006  Job:  [id number]

## 2011-05-18 NOTE — Discharge Summary (Signed)
NAME:  Lori Huerta, Lori Huerta                ACCOUNT NO.:  [id number]   MEDICAL RECORD NO.:  [id number]          PATIENT TYPE:  INP   LOCATION:  9116                          FACILITY:  [facility]   PHYSICIAN:  Conni Elliot, M.D.DATE OF BIRTH:  08-16-1986   DATE OF ADMISSION:  03/18/2006  DATE OF DISCHARGE:  03/22/2006                                 DISCHARGE SUMMARY   ADMISSION DIAGNOSES:  Intrauterine pregnancy and pre-eclampsia.   DISCHARGE DIAGNOSES:  Term pregnancy, delivered.   PROCEDURES:  The patient was started on magnesium during labor for pre-  eclampsia prophylaxis.   LABORATORY DATA:  Pertinent labs included March 21, 2006 hemoglobin of 7.8,  hematocrit of 25.0. Her white blood cell count was 18.7 and platelet count  312,000.  Patient had sodium of 137, potassium 3.6, BUN 4, creatinine 0.8,  AST 25, ALT 10, uric acid 7.0, LDH 221.   HOSPITAL COURSE:  The patient is a 25 year old G1 who was admitted at 36-6/7  weeks for induction of labor for pre-eclampsia.  She received Cervidil  overnight and was started on magnesium.  Initially Pitocin was held because  patient was contracting regularly, however, her contractions began to slow  down and Pitocin was started.  The patient received an epidural. Patient was  complete and pushing but not making much progress.  It appeared that the  patient was initially left occipital transverse and eventually turned to  occipital anterior.  However, the patient was not able to extract baby on  her own and we proceeded to low forceps delivery after a pudendal block was  done.  Episiotomy was cut midline.  The baby's head came out with a nuchal  cord X1 which was reduced.  The baby had Apgar's of 8 and 9.  Cord gas was  sent and pH was 7.33.  Intact 3 cord placenta was delivered spontaneously.  The episiotomy was repaired with 2-0 chromic.  Estimated blood loss was 500  mL.  The patient and infant were stable and the patient was transferred to  the  AICU because her magnesium was continued.  Magnesium was stopped on  March 21, 2006 at about 1:30 P.M. after being on the magnesium for about 36  hours.  The magnesium was continued for this amount of time because the  patient was not having good urine output at first but urine output greatly  outweighed urine input at this time.  On day of discharge the patient denies  any headache, blurred vision, chest pain, shortness of breath or right upper  quadrant pain.  She is tolerating p.o.'s and ambulating well.  He blood  pressures are still elevated in the 140's/90's.  We will have the home  health nurse come check her blood pressure's in a few days but likely they  will return to normal.  The patient had a female baby and they did not desire  a circumcision as an inpatient.  She is bottle feeding.  She is blood type B  positive and Rubella immune.  She was also GBS negative. The patient desires  Ortho-Evra patches for  contraception but will wait until her six weeks check  up to make any type of decision.   CONDITION ON DISCHARGE:  Stable.   DISPOSITION:  Home.   DISCHARGE MEDICATIONS:  1.  Ibuprofen 600 mg q.6h. as needed for pain.  2.  Colace 100 mg twice daily as needed for postpartum constipation.  3.  Iron 325 mg twice daily for anemia.  4.  Prenatal vitamins daily.  5.  Dermaplast spray, apply as directed to laceration.   FOLLOW UP:  Follow up with [facility] in six weeks and home health in  two to three days to check blood pressure.     ______________________________  Obstetrics Resident    ______________________________  Conni Elliot, M.D.    OR/MEDQ  D:  03/22/2006  T:  03/23/2006  Job:  [id number]

## 2011-05-25 ENCOUNTER — Ambulatory Visit: Payer: Self-pay | Admitting: Family Medicine

## 2011-06-07 ENCOUNTER — Telehealth: Payer: Self-pay | Admitting: *Deleted

## 2011-06-07 ENCOUNTER — Other Ambulatory Visit: Payer: Self-pay | Admitting: Family Medicine

## 2011-06-07 DIAGNOSIS — F419 Anxiety disorder, unspecified: Secondary | ICD-10-CM

## 2011-06-07 MED ORDER — LORAZEPAM 0.5 MG PO TABS: 0.5000 mg | ORAL_TABLET | Freq: Two times a day (BID) | ORAL | Status: DC

## 2011-06-07 NOTE — Telephone Encounter (Signed)
Case discussed with Dennison Nancy while pt was on the phone. Will plan for follow up next week.

## 2011-06-07 NOTE — Telephone Encounter (Signed)
Dr. Alvester Morin instructed her to double her Prozac and he wrote a script for Ativan.  Her aunt will bring her in later this afternoon to pick it up.
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
## 2011-08-13 ENCOUNTER — Telehealth: Payer: Self-pay | Admitting: Family Medicine

## 2011-08-13 ENCOUNTER — Ambulatory Visit: Payer: Self-pay | Admitting: Family Medicine

## 2011-08-13 DIAGNOSIS — F4001 Agoraphobia with panic disorder: Secondary | ICD-10-CM

## 2011-08-13 DIAGNOSIS — F419 Anxiety disorder, unspecified: Secondary | ICD-10-CM

## 2011-08-13 NOTE — Telephone Encounter (Signed)
Attempted to call patient, phone does not accept incoming calls. If patient returns call please ask her which medications she needs refilled.Lori Huerta, Rodena Medin

## 2011-08-13 NOTE — Telephone Encounter (Signed)
Lori Huerta is needing refills on her meds to hold her until her appt on 8/27.  She thinks that the pharmacy has sent a request for one.  But also needs the other.  Is that possible. Please call her if this is a problem.

## 2011-08-14 NOTE — Telephone Encounter (Signed)
Forward to Dr Newton for refill request 

## 2011-08-14 NOTE — Telephone Encounter (Signed)
Need refill for Fluoxetine and Lorazepam.  Totally out of meds.  Appt not until 8/27.

## 2011-08-15 ENCOUNTER — Other Ambulatory Visit: Payer: Self-pay | Admitting: Family Medicine

## 2011-08-15 DIAGNOSIS — F329 Major depressive disorder, single episode, unspecified: Secondary | ICD-10-CM

## 2011-08-15 MED ORDER — FLUOXETINE HCL 20 MG PO CAPS: 20.0000 mg | ORAL_CAPSULE | Freq: Every day | ORAL | Status: DC

## 2011-08-15 MED ORDER — LORAZEPAM 0.5 MG PO TABS: 0.5000 mg | ORAL_TABLET | Freq: Two times a day (BID) | ORAL | Status: DC

## 2011-08-15 NOTE — Telephone Encounter (Signed)
Pt is needing refill and needs this asap.  Not sure why she can't get them.

## 2011-08-15 NOTE — Telephone Encounter (Signed)
Will page Dr. Alvester Morin.

## 2011-08-15 NOTE — Telephone Encounter (Signed)
Patient has changed pharmacies.  She now uses the [facility] in [place].  The Prozac had already been refill by Dr. Alvester Morin at [facility] so I told patient she would need to pick it up there.  I called in the Ativan to the [facility].  Patient aware.

## 2011-08-27 ENCOUNTER — Ambulatory Visit (INDEPENDENT_AMBULATORY_CARE_PROVIDER_SITE_OTHER): Payer: Self-pay | Admitting: Family Medicine

## 2011-08-27 DIAGNOSIS — F411 Generalized anxiety disorder: Secondary | ICD-10-CM

## 2011-08-27 DIAGNOSIS — F419 Anxiety disorder, unspecified: Secondary | ICD-10-CM

## 2011-08-27 DIAGNOSIS — F3289 Other specified depressive episodes: Secondary | ICD-10-CM

## 2011-08-27 DIAGNOSIS — F341 Dysthymic disorder: Secondary | ICD-10-CM

## 2011-08-27 DIAGNOSIS — F329 Major depressive disorder, single episode, unspecified: Secondary | ICD-10-CM

## 2011-08-27 MED ORDER — FLUOXETINE HCL 20 MG PO CAPS: 40.0000 mg | ORAL_CAPSULE | Freq: Every day | ORAL | Status: DC

## 2011-08-27 MED ORDER — LORAZEPAM 0.5 MG PO TABS: 0.5000 mg | ORAL_TABLET | Freq: Two times a day (BID) | ORAL | Status: DC

## 2011-08-27 NOTE — Assessment & Plan Note (Addendum)
Instructed pt to take prozac 40mg  once daily as to avoid missed doses. Also instructed pt to take scheduled ativan until stable SSRI dose is established. Pt is pending formal [facility] set up. Would like psychiatry/psychology referral once established. Will follow up in 2 weeks.

## 2011-08-27 NOTE — Patient Instructions (Signed)
It was good to see you today Try taking the prozac 40mg  in the morning so as to not miss any doses  Also take the klonopin on a scheduled basis until our next follow up appt.  Once you get your [facility] set up, I would like to refer you to a therapist to help with your mood Come back to see me in 2-3 weeks,  Call with any questions, God Bless,  [name] MD
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
## 2011-08-27 NOTE — Assessment & Plan Note (Signed)
Ativan refilled with fill date of 09/15/11.

## 2011-10-14 ENCOUNTER — Emergency Department (HOSPITAL_COMMUNITY)
Admission: EM | Admit: 2011-10-14 | Discharge: 2011-10-14 | Disposition: A | Discharge status: Home / Self Care | Payer: Self-pay | Attending: Emergency Medicine | Admitting: Emergency Medicine

## 2011-10-14 ENCOUNTER — Encounter (HOSPITAL_COMMUNITY): Payer: Self-pay | Admitting: *Deleted

## 2011-10-14 DIAGNOSIS — R42 Dizziness and giddiness: Secondary | ICD-10-CM | POA: Insufficient documentation

## 2011-10-14 DIAGNOSIS — N390 Urinary tract infection, site not specified: Secondary | ICD-10-CM | POA: Insufficient documentation

## 2011-10-14 DIAGNOSIS — R5381 Other malaise: Secondary | ICD-10-CM | POA: Insufficient documentation

## 2011-10-14 DIAGNOSIS — R5383 Other fatigue: Secondary | ICD-10-CM | POA: Insufficient documentation

## 2011-10-14 LAB — CBC
MCH: 29.5 pg (ref 26.0–34.0)
MCHC: 34.4 g/dL (ref 30.0–36.0)
Platelets: 359 10*3/uL (ref 150–400)
RDW: 13.4 % (ref 11.5–15.5)

## 2011-10-14 LAB — URINALYSIS, ROUTINE W REFLEX MICROSCOPIC
Glucose, UA: NEGATIVE mg/dL
Leukocytes, UA: NEGATIVE
Nitrite: POSITIVE — AB
Protein, ur: NEGATIVE mg/dL
Urobilinogen, UA: 0.2 mg/dL (ref 0.0–1.0)

## 2011-10-14 LAB — URINE MICROSCOPIC-ADD ON

## 2011-10-14 LAB — PREGNANCY, URINE: Preg Test, Ur: NEGATIVE

## 2011-10-14 MED ORDER — CEPHALEXIN 500 MG PO CAPS: 500.0000 mg | ORAL_CAPSULE | Freq: Once | ORAL | Status: DC

## 2011-10-14 MED ORDER — CEPHALEXIN 500 MG PO CAPS: 1000.0000 mg | ORAL_CAPSULE | Freq: Two times a day (BID) | ORAL | Status: AC

## 2011-10-14 MED ORDER — CEPHALEXIN 500 MG PO CAPS
1000.0000 mg | ORAL_CAPSULE | Freq: Once | ORAL | Status: AC
  Administered 2011-10-14: 1000 mg via ORAL
  Filled 2011-10-14: qty 2

## 2011-10-14 NOTE — ED Notes (Signed)
Pt states since Friday she has been getting light headed when she stands up. Pt states she has to concentrate on not falling. Pt states when she is sitting down, she gets tired and wants to go to sleep.

## 2011-10-14 NOTE — ED Notes (Signed)
Pt reports that since Friday, she "feels like I'm going to fall".  Denies dizziness or weakness, just "a little light headed sometimes.".  No additional concerns verbalized.  No distress noted.

## 2011-10-14 NOTE — ED Notes (Signed)
Orthostatic VS completed.  Pt denied dizziness.  No unsteadiness noted when standing.  Ambulated without difficulty to BR.

## 2011-10-21 NOTE — ED Provider Notes (Signed)
History     CSN: [id number] Arrival date & time: 10/14/2011  8:27 PM   None     Chief Complaint  Patient presents with  . Dizziness  . Fatigue    (Consider location/radiation/quality/duration/timing/severity/associated sxs/prior treatment) Patient is a 25 y.o. female presenting with weakness. The history is provided by the patient.  Weakness Primary symptoms do not include headaches, seizures, dizziness, fever or nausea. Primary symptoms comment: Ms. Polyakov present with generalized fatigue and intermittent episodes of feeling light headed when she stands too quickly.  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 The symptoms are unchanged. The neurological symptoms are diffuse. The symptoms occurred after standing up.  Additional symptoms include weakness and loss of balance. Additional symptoms do not include neck stiffness, pain, photophobia, aura, hallucinations, nystagmus, hyperacusis or vertigo. Associated symptoms comments: She denies headache,  Fever,  Also denies any recent falls or head injury. Marland Kitchen     History reviewed. No pertinent past medical history.  History reviewed. No pertinent past surgical history.  History reviewed. No pertinent family history.  History  Substance Use Topics  . Smoking status: Never Smoker   . Smokeless tobacco: Not on file  . Alcohol Use: Yes    OB History    Grav Para Term Preterm Abortions TAB SAB Ect Mult Living                  Review of Systems  Constitutional: Negative for fever.  HENT: Negative for congestion, sore throat, neck pain and neck stiffness.   Eyes: Negative.  Negative for photophobia and visual disturbance.  Respiratory: Negative for chest tightness and shortness of breath.   Cardiovascular: Negative for chest pain and palpitations.  Gastrointestinal: Negative for nausea and abdominal pain.  Genitourinary: Negative.   Musculoskeletal: Negative for joint swelling and arthralgias.  Skin: Negative.   Negative for rash and wound.  Neurological: Positive for weakness and loss of balance. Negative for dizziness, vertigo, seizures, speech difficulty, light-headedness, numbness and headaches.  Hematological: Negative.   Psychiatric/Behavioral: Negative.  Negative for hallucinations.    Allergies  Review of patient's allergies indicates no known allergies.  Home Medications   Current Outpatient Rx  Name Route Sig Dispense Refill  . CEPHALEXIN 500 MG PO CAPS Oral Take 2 capsules (1,000 mg total) by mouth 2 (two) times daily. 28 capsule 0  . FLUOXETINE HCL 20 MG PO CAPS Oral Take 2 capsules (40 mg total) by mouth daily. 60 capsule 6  . LORAZEPAM 0.5 MG PO TABS Oral Take 1 tablet (0.5 mg total) by mouth 2 (two) times daily. 31 tablet 0    DO NOT FILL UNTIL 09/15/11    BP 130/94  Pulse 86  Temp(Src) 98.7 F (37.1 C) (Oral)  Resp 16  Ht 5\' 6"  (1.676 m)  Wt 133 lb (60.328 kg)  BMI 21.47 kg/m2  SpO2 100%  Physical Exam  Nursing note and vitals reviewed. Constitutional: She is oriented to person, place, and time. She appears well-developed and well-nourished.  HENT:  Head: Normocephalic and atraumatic.  Mouth/Throat: Oropharynx is clear and moist.  Eyes: EOM are normal. Pupils are equal, round, and reactive to light.  Neck: Normal range of motion. Neck supple.  Cardiovascular: Normal rate and normal heart sounds.   Pulmonary/Chest: Effort normal.  Abdominal: Soft. There is no tenderness.  Musculoskeletal: Normal range of motion.  Lymphadenopathy:    She has no cervical adenopathy.  Neurological: She is alert and oriented to person, place, and  time. She has normal strength. No cranial nerve deficit or sensory deficit. She displays a negative Romberg sign. Gait normal. GCS eye subscore is 4. GCS verbal subscore is 5. GCS motor subscore is 6.       Normal heel-shin, normal rapid alternating movements.  Skin: Skin is warm and dry. No rash noted.  Psychiatric: She has a normal mood and  affect. Her speech is normal and behavior is normal. Thought content normal. Cognition and memory are normal.    ED Course  Procedures (including critical care time)  Labs Reviewed  URINALYSIS, ROUTINE W REFLEX MICROSCOPIC - Abnormal; Notable for the following:    Appearance HAZY (*)    Specific Gravity, Urine >1.030 (*)    Hgb urine dipstick MODERATE (*)    Ketones, ur TRACE (*)    Nitrite POSITIVE (*)    All other components within normal limits  URINE MICROSCOPIC-ADD ON - Abnormal; Notable for the following:    Squamous Epithelial / LPF FEW (*)    Bacteria, UA MANY (*)    All other components within normal limits  PREGNANCY, URINE  CBC  LAB REPORT - SCANNED   No results found.   1. Urinary tract infection    Orthostatic vital signs reviewed and normal.   MDM  Uti.  abx prescribed.  Encouraged rest,  Increase fluid intake.  Also discussed that pt is working 7 days per week,  And is generally fatigued and exhausted.  Encouraged her to cut back on hours until feeling better.        Candis Musa, PA 10/21/11 [phone]

## 2011-10-21 NOTE — ED Provider Notes (Signed)
Medical screening examination/treatment/procedure(s) were performed by non-physician practitioner and as supervising physician I was immediately available for consultation/collaboration.  Brianna Bennett R. Melinna Linarez, MD 10/21/11 1255 

## 2011-11-30 ENCOUNTER — Emergency Department (HOSPITAL_COMMUNITY)
Admission: EM | Admit: 2011-11-30 | Discharge: 2011-11-30 | Disposition: A | Discharge status: Home / Self Care | Payer: Self-pay | Attending: Emergency Medicine | Admitting: Emergency Medicine

## 2011-11-30 ENCOUNTER — Encounter (HOSPITAL_COMMUNITY): Payer: Self-pay | Admitting: *Deleted

## 2011-11-30 DIAGNOSIS — N390 Urinary tract infection, site not specified: Secondary | ICD-10-CM | POA: Insufficient documentation

## 2011-11-30 DIAGNOSIS — X58XXXA Exposure to other specified factors, initial encounter: Secondary | ICD-10-CM | POA: Insufficient documentation

## 2011-11-30 DIAGNOSIS — S39012A Strain of muscle, fascia and tendon of lower back, initial encounter: Secondary | ICD-10-CM

## 2011-11-30 DIAGNOSIS — S335XXA Sprain of ligaments of lumbar spine, initial encounter: Secondary | ICD-10-CM | POA: Insufficient documentation

## 2011-11-30 LAB — URINE MICROSCOPIC-ADD ON

## 2011-11-30 LAB — URINALYSIS, ROUTINE W REFLEX MICROSCOPIC
Bilirubin Urine: NEGATIVE
Glucose, UA: NEGATIVE mg/dL
Ketones, ur: NEGATIVE mg/dL
Nitrite: POSITIVE — AB
Specific Gravity, Urine: >1.03 — ABNORMAL HIGH (ref 1.005–1.030)
pH: 6 (ref 5.0–8.0)

## 2011-11-30 MED ORDER — CYCLOBENZAPRINE HCL 5 MG PO TABS: 5.0000 mg | ORAL_TABLET | Freq: Three times a day (TID) | ORAL | Status: AC | PRN

## 2011-11-30 MED ORDER — HYDROCODONE-ACETAMINOPHEN 5-325 MG PO TABS: 1.0000 | ORAL_TABLET | ORAL | Status: AC | PRN

## 2011-11-30 MED ORDER — NITROFURANTOIN MONOHYD MACRO 100 MG PO CAPS: 100.0000 mg | ORAL_CAPSULE | Freq: Two times a day (BID) | ORAL | Status: AC

## 2011-11-30 MED ORDER — HYDROCODONE-ACETAMINOPHEN 5-325 MG PO TABS
1.0000 | ORAL_TABLET | Freq: Once | ORAL | Status: AC
  Administered 2011-11-30: 1 via ORAL
  Filled 2011-11-30: qty 1

## 2011-11-30 NOTE — ED Notes (Signed)
Pt c/o severe lower and middle back pain x 2 days

## 2011-11-30 NOTE — ED Notes (Signed)
Pt states she had some vaginal bleeding on Wednesday as well with the pain

## 2011-12-03 LAB — URINE CULTURE

## 2011-12-03 NOTE — ED Provider Notes (Signed)
History     CSN: [id number] Arrival date & time: 11/30/2011  5:12 PM   First MD Initiated Contact with Patient 11/30/11 1713      Chief Complaint  Patient presents with  . Back Pain    (Consider location/radiation/quality/duration/timing/severity/associated sxs/prior treatment) Patient is a 25 y.o. female presenting with back pain. The history is provided by the patient.  Back Pain  This is a new problem. The current episode started 2 days ago. The problem occurs constantly. The problem has not changed since onset.The pain is associated with no known injury. The pain is present in the thoracic spine and lumbar spine. The quality of the pain is described as aching. The pain does not radiate. The pain is at a severity of 7/10. The pain is moderate. The symptoms are aggravated by certain positions (Pain is improved when standing straight,  worsened with movement,  bending and lying in bed.). Associated symptoms include dysuria. Pertinent negatives include no chest pain, no fever, no numbness, no headaches, no abdominal pain, no bowel incontinence, no perianal numbness, no paresthesias, no paresis, no tingling and no weakness. She has tried analgesics for the symptoms. The treatment provided no relief.    History reviewed. No pertinent past medical history.  History reviewed. No pertinent past surgical history.  History reviewed. No pertinent family history.  History  Substance Use Topics  . Smoking status: Never Smoker   . Smokeless tobacco: Not on file  . Alcohol Use: Yes     occasionally    OB History    Grav Para Term Preterm Abortions TAB SAB Ect Mult Living                  Review of Systems  Constitutional: Negative for fever.  HENT: Negative for congestion, sore throat and neck pain.   Eyes: Negative.   Respiratory: Negative for chest tightness and shortness of breath.   Cardiovascular: Negative for chest pain.  Gastrointestinal: Negative for nausea, abdominal pain  and bowel incontinence.  Genitourinary: Positive for dysuria.  Musculoskeletal: Positive for back pain. Negative for joint swelling and arthralgias.  Skin: Negative.  Negative for rash and wound.  Neurological: Negative for dizziness, tingling, weakness, light-headedness, numbness, headaches and paresthesias.  Hematological: Negative.   Psychiatric/Behavioral: Negative.     Allergies  Review of patient's allergies indicates no known allergies.  Home Medications   Current Outpatient Rx  Name Route Sig Dispense Refill  . CYCLOBENZAPRINE HCL 5 MG PO TABS Oral Take 1 tablet (5 mg total) by mouth 3 (three) times daily as needed for muscle spasms. 15 tablet 0  . FLUOXETINE HCL 20 MG PO CAPS Oral Take 2 capsules (40 mg total) by mouth daily. 60 capsule 6  . HYDROCODONE-ACETAMINOPHEN 5-325 MG PO TABS Oral Take 1 tablet by mouth every 4 (four) hours as needed for pain. 15 tablet 0  . LORAZEPAM 0.5 MG PO TABS Oral Take 1 tablet (0.5 mg total) by mouth 2 (two) times daily. 31 tablet 0    DO NOT FILL UNTIL 09/15/11  . NITROFURANTOIN MONOHYD MACRO 100 MG PO CAPS Oral Take 1 capsule (100 mg total) by mouth 2 (two) times daily. 10 capsule 0    BP 111/74  Pulse 72  Temp(Src) 97.9 F (36.6 C) (Oral)  Resp 18  Ht 5\' 6"  (1.676 m)  Wt 125 lb (56.7 kg)  BMI 20.18 kg/m2  SpO2 100%  Physical Exam  Nursing note and vitals reviewed. Constitutional: She is oriented to person,  place, and time. She appears well-developed and well-nourished.  HENT:  Head: Normocephalic and atraumatic.  Eyes: Conjunctivae are normal.  Neck: Normal range of motion. Neck supple.  Cardiovascular: Normal rate, regular rhythm, normal heart sounds and intact distal pulses.        Pedal pulses normal.  Pulmonary/Chest: Effort normal and breath sounds normal. She has no wheezes.  Abdominal: Soft. Bowel sounds are normal. She exhibits no distension and no mass. There is no tenderness.  Musculoskeletal: Normal range of motion.  She exhibits no edema.       Lumbar back: She exhibits tenderness. She exhibits no swelling, no edema and no spasm.  Neurological: She is alert and oriented to person, place, and time. She has normal strength. She displays no atrophy and no tremor. No cranial nerve deficit or sensory deficit. Gait normal.  Reflex Scores:      Patellar reflexes are 2+ on the right side and 2+ on the left side.      Achilles reflexes are 2+ on the right side and 2+ on the left side.      No strength deficit noted in hip and knee flexor and extensor muscle groups.  Ankle flexion and extension intact.  Skin: Skin is warm and dry.  Psychiatric: She has a normal mood and affect.    ED Course  Procedures (including critical care time)  Labs Reviewed  URINALYSIS, ROUTINE W REFLEX MICROSCOPIC - Abnormal; Notable for the following:    Specific Gravity, Urine >1.030 (*)    Hgb urine dipstick SMALL (*)    Protein, ur TRACE (*)    Nitrite POSITIVE (*)    All other components within normal limits  URINE MICROSCOPIC-ADD ON - Abnormal; Notable for the following:    Squamous Epithelial / LPF MANY (*)    Bacteria, UA MANY (*)    All other components within normal limits  PREGNANCY, URINE  URINE CULTURE   No results found.   1. Lumbar strain   2. Urinary tract infection       MDM  Patient does report frequent uti's since last pregnancy 1 year ago.  Would not expect back pain to be positional if purely related to uti,  So suspect there is a component of lumbosacral strain as well as uti.          Candis Musa, PA 12/03/11 1604  Candis Musa, PA 12/03/11 [phone]

## 2011-12-05 NOTE — ED Notes (Signed)
+   urine Patient treated with Macrobid-sensitive to same-chart appended per protocol MD. 

## 2011-12-05 NOTE — ED Provider Notes (Signed)
Medical screening examination/treatment/procedure(s) were performed by non-physician practitioner and as supervising physician I was immediately available for consultation/collaboration.  Donnetta Hutching, MD 12/05/11 [phone]

## 2012-01-07 ENCOUNTER — Telehealth: Payer: Self-pay | Admitting: Family Medicine

## 2012-01-07 NOTE — Telephone Encounter (Signed)
Lori Huerta is calling about the refill request for Prozac and Ativan.  Her pharmacy was to send requests earlier, but it looks like Dr. Alvester Morin may have already picked them up.

## 2012-01-07 NOTE — Telephone Encounter (Signed)
Will send refill request to MD. Advised patient to check at pharmacy in couple of days.

## 2012-01-09 ENCOUNTER — Other Ambulatory Visit: Payer: Self-pay | Admitting: Family Medicine

## 2012-01-09 DIAGNOSIS — F419 Anxiety disorder, unspecified: Secondary | ICD-10-CM

## 2012-01-09 MED ORDER — LORAZEPAM 0.5 MG PO TABS: 0.5000 mg | ORAL_TABLET | Freq: Two times a day (BID) | ORAL | Status: DC

## 2012-01-09 MED ORDER — FLUOXETINE HCL 20 MG PO CAPS: 40.0000 mg | ORAL_CAPSULE | Freq: Every day | ORAL | Status: DC

## 2012-01-09 NOTE — Telephone Encounter (Signed)
Addendum: Attempted to call in ativan to [facility] in [place]. Got "verizon wireless" error when attempting to connect to pharmacy associate.  If pt inquires, please inform pt that she will have to pick up ativan up front from office. Prozac refilled.

## 2012-01-09 NOTE — Telephone Encounter (Signed)
Attempted to call pt back on multiple phone lines to discuss meds. Was unable to leave VM as all numbers did not receive outside calls.  Medications have been filled if pt inquires.

## 2012-02-17 ENCOUNTER — Emergency Department (HOSPITAL_COMMUNITY)
Admission: EM | Admit: 2012-02-17 | Discharge: 2012-02-17 | Disposition: A | Discharge status: Home / Self Care | Payer: Self-pay | Attending: Emergency Medicine | Admitting: Emergency Medicine

## 2012-02-17 ENCOUNTER — Encounter (HOSPITAL_COMMUNITY): Payer: Self-pay

## 2012-02-17 DIAGNOSIS — J029 Acute pharyngitis, unspecified: Secondary | ICD-10-CM | POA: Insufficient documentation

## 2012-02-17 LAB — RAPID STREP SCREEN (MED CTR MEBANE ONLY): Streptococcus, Group A Screen (Direct): NEGATIVE

## 2012-02-17 MED ORDER — IBUPROFEN 800 MG PO TABS
800.0000 mg | ORAL_TABLET | Freq: Once | ORAL | Status: AC
  Administered 2012-02-17: 800 mg via ORAL
  Filled 2012-02-17: qty 1

## 2012-02-17 NOTE — ED Provider Notes (Cosign Needed)
History     CSN: [id number]  Arrival date & time 02/17/12  2028   First MD Initiated Contact with Patient 02/17/12 2040      Chief Complaint  Patient presents with  . Sore Throat    (Consider location/radiation/quality/duration/timing/severity/associated sxs/prior treatment) HPI  Patient relates she used to have sore throats a lot when she was younger. She states last night she felt like she kept having to clear her throat but that feeling never went away. She denies having any pain last night. However this morning she started having pain in her throat in it's painful to swallow. She denies known fever but has felt cold all day. She denies earache, cough, nausea or vomiting. She has not been around anybody else for sore throat that she is aware of. 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  [facility]  History reviewed. No pertinent past medical history.  Past Surgical History  Procedure Date  . Cesarean section     No family history on file.  History  Substance Use Topics  . Smoking status: Never Smoker   . Smokeless tobacco: Not on file  . Alcohol Use: Yes     occasionally  Employed  OB History    Grav Para Term Preterm Abortions TAB SAB Ect Mult Living                  Review of Systems  All other systems reviewed and are negative.    Allergies  Review of patient's allergies indicates no known allergies.  Home Medications   Current Outpatient Rx  Name Route Sig Dispense Refill  . FLUOXETINE HCL 20 MG PO CAPS Oral Take 2 capsules (40 mg total) by mouth daily. 60 capsule 6  . LORAZEPAM 0.5 MG PO TABS Oral Take 1 tablet (0.5 mg total) by mouth 2 (two) times daily. 31 tablet 0    DO NOT FILL UNTIL 09/15/11    BP 117/77  Pulse 75  Temp(Src) 97.9 F (36.6 C) (Oral)  Resp 16  Ht 5\' 6"  (1.676 m)  Wt 131 lb (59.421 kg)  BMI 21.14 kg/m2  SpO2 100%  Vital signs normal    Physical Exam  Nursing note and  vitals reviewed. Constitutional: She is oriented to person, place, and time. She appears well-developed and well-nourished.  Non-toxic appearance. She does not appear ill. No distress.       Voice is normal  HENT:  Head: Normocephalic and atraumatic.  Right Ear: External ear normal.  Left Ear: External ear normal.  Nose: Nose normal. No mucosal edema or rhinorrhea.  Mouth/Throat: Oropharynx is clear and moist and mucous membranes are normal. No dental abscesses or uvula swelling.       Right TM it's cured by cerumen left TM is normal. Her tonsils are not enlarged, there is no erythema, there is no soft tissue swelling seen.  Eyes: Conjunctivae and EOM are normal. Pupils are equal, round, and reactive to light.  Neck: Normal range of motion and full passive range of motion without pain. Neck supple.  Cardiovascular: Normal rate, regular rhythm and normal heart sounds.  Exam reveals no gallop and no friction rub.   No murmur heard. Pulmonary/Chest: Effort normal and breath sounds normal. No respiratory distress. She has no wheezes. She has no rhonchi. She has no rales. She exhibits no tenderness and no crepitus.  Abdominal: Normal appearance.  Musculoskeletal: Normal range of motion. She exhibits  no edema and no tenderness.       Moves all extremities well.   Lymphadenopathy:    She has no cervical adenopathy.  Neurological: She is alert and oriented to person, place, and time. She has normal strength. No cranial nerve deficit.  Skin: Skin is warm, dry and intact. No rash noted. No erythema. No pallor.  Psychiatric: She has a normal mood and affect. Her speech is normal and behavior is normal. Her mood appears not anxious.    ED Course  Procedures (including critical care time)  Pt given ibuprofen for pain and states her pain is gone, just feels scratchy now.  Results for orders placed during the hospital encounter of 02/17/12  RAPID STREP SCREEN      Component Value Range    Streptococcus, Group A Screen (Direct) NEGATIVE  NEGATIVE    . 1. Viral pharyngitis     Plan discharge  Devoria Albe, MD, FACEP   MDM          Ward Givens, MD 02/17/12 2139

## 2012-02-17 NOTE — ED Notes (Signed)
MD at bedside. 

## 2012-02-17 NOTE — ED Notes (Signed)
Pt presents with sore throat since yesterday. Pt has used OTC medication with no relief.

## 2012-02-26 ENCOUNTER — Telehealth: Payer: Self-pay | Admitting: Family Medicine

## 2012-02-26 NOTE — Telephone Encounter (Signed)
Pt called back to ask about refill for her Ativan.  Read to her the instructions from her visit in August 2012.  Pt inquired about information for [facility].  Gave her phone number and told her to contact the office for assistance.  Pt still want Dr. Alvester Morin to call her discuss ?of refill for med.

## 2012-02-26 NOTE — Telephone Encounter (Signed)
Called patient to advise that MD will be in clinic this afternoon and will address refill request at that time. She voices understanding.

## 2012-02-26 NOTE — Telephone Encounter (Signed)
Will forward to Dr Newton. 

## 2012-02-26 NOTE — Telephone Encounter (Signed)
Patient is still waiting for her refill on Ativan that needs to be sent to [facility] in [place].  She has been waiting for several weeks for this, and says she has called multiple time although I do not see documentaion to that affect.  She says it is really getting difficult without the medication now.

## 2012-02-26 NOTE — Telephone Encounter (Signed)
Call up fairly lengthy message on patient's voice mail instructed her that I will give a very short course of Ativan for her to use and this medication will need to be picked up front (attempted to call in prescription to [facility] in refill however I got a [employer] message with no ability to leave a message). Told patient that I given her a one-week supply of for her to followup within that week for a mood visit as patient has not been seen since August  (2 week followup was discussed at August visit).

## 2012-02-27 ENCOUNTER — Telehealth: Payer: Self-pay | Admitting: Family Medicine

## 2012-02-27 ENCOUNTER — Other Ambulatory Visit: Payer: Self-pay | Admitting: Family Medicine

## 2012-02-27 DIAGNOSIS — F419 Anxiety disorder, unspecified: Secondary | ICD-10-CM

## 2012-02-27 MED ORDER — LORAZEPAM 0.5 MG PO TABS: 0.5000 mg | ORAL_TABLET | Freq: Two times a day (BID) | ORAL | Status: DC

## 2012-02-27 NOTE — Telephone Encounter (Signed)
Patient informed that Rx ready to pick up at front desk.  Gaylene Brooks, RN

## 2012-02-27 NOTE — Telephone Encounter (Signed)
Rx wasn't sent to pharmacy.  Please send for patient.  Have been waiting for a while for rx

## 2012-02-27 NOTE — Telephone Encounter (Signed)
error 

## 2012-03-07 ENCOUNTER — Ambulatory Visit: Payer: Self-pay | Admitting: Family Medicine

## 2012-03-10 ENCOUNTER — Ambulatory Visit (INDEPENDENT_AMBULATORY_CARE_PROVIDER_SITE_OTHER): Payer: Self-pay | Admitting: Family Medicine

## 2012-03-10 ENCOUNTER — Encounter: Payer: Self-pay | Admitting: Family Medicine

## 2012-03-10 VITALS — BP 118/80 | HR 75 | Ht 66.0 in | Wt 128.0 lb

## 2012-03-10 DIAGNOSIS — F329 Major depressive disorder, single episode, unspecified: Secondary | ICD-10-CM

## 2012-03-10 DIAGNOSIS — S335XXA Sprain of ligaments of lumbar spine, initial encounter: Secondary | ICD-10-CM

## 2012-03-10 DIAGNOSIS — F3289 Other specified depressive episodes: Secondary | ICD-10-CM

## 2012-03-10 DIAGNOSIS — M549 Dorsalgia, unspecified: Secondary | ICD-10-CM

## 2012-03-10 DIAGNOSIS — S39012A Strain of muscle, fascia and tendon of lower back, initial encounter: Secondary | ICD-10-CM | POA: Insufficient documentation

## 2012-03-10 MED ORDER — KETOROLAC TROMETHAMINE 60 MG/2ML IM SOLN
60.0000 mg | Freq: Once | INTRAMUSCULAR | Status: AC
  Administered 2012-03-10: 60 mg via INTRAMUSCULAR

## 2012-03-10 NOTE — Patient Instructions (Signed)
It was good to see today I will like to see a psychologist articaine Tuck watch her mood We'll continue your current medications And Ativan runs out please give me a call and I will give the Klonopin instead For your back avoid lifting heavy objects such as your son Use Tylenol and ibuprofen as needed for pain relief Call if any questions God Bless Lori Albee MD

## 2012-03-10 NOTE — Progress Notes (Signed)
  Subjective:    Patient ID: Lori Huerta, female    DOB: April 29, 1986, 26 y.o.   MRN: [id number]  HPI Pt is here for mood follow up. Pt has a baseline history of anxiety and depression currently being treated with prozac 40mg  daily and prn ativan. Pt was last seen 08/2011 for this.  Mood: Pt states that mood is somewhat stable prozac. Pt states that she has had some instances of increased emotional lability including agitation and irritability associated with different stressful situations both at home and at work. Pt realizes that in hindsight that her agitation, anger and irritation were inappropriate. Pt does report some intermittent insomia, and racing thoughts. No indiscretionary behavior. Pt states her mother has been treated for mood disorder for a very long period of time.   Lumbar Strain: Pt reports R sided lumbar pain over last 2-3 weeks. Pt states that pain is worse bending and lateral rotation. Pt states that she carries her 91 month old son on a daily basis. No trauma.      Review of Systems See HPI, otherwise ROS negative     Objective:   Physical Exam Gen: in bed, NAD MSK: + TTP on R upper  lumbar region       Assessment & Plan:

## 2012-03-10 NOTE — Assessment & Plan Note (Signed)
PHQ-9 Score 0 today. While a formal dx of bipolar disorder is not clear today. I think that pt can benefit from further evaluation with [facility]. Will make no medication changes. Discussed psych red flags. Plan to follow up pending mood disorder clinic follow up.

## 2012-03-10 NOTE — Assessment & Plan Note (Signed)
Toradol 60 in clinic today. Scheduled NSAIDs. Strenuous lifting avoidance. Will follow prn.

## 2012-03-18 ENCOUNTER — Telehealth: Payer: Self-pay | Admitting: Family Medicine

## 2012-03-18 ENCOUNTER — Ambulatory Visit (INDEPENDENT_AMBULATORY_CARE_PROVIDER_SITE_OTHER): Payer: Self-pay | Admitting: Psychology

## 2012-03-18 DIAGNOSIS — F39 Unspecified mood [affective] disorder: Secondary | ICD-10-CM

## 2012-03-18 NOTE — Patient Instructions (Signed)
Please schedule a follow-up for therapy for April 1st at 9:00. I also asked for you to schedule an appointment in [facility] for April 17th at 11:00.  Depending on my conversation with Dr. Alvester Morin, we may be able to cancel that appointment. A combination of therapy and medication is likely the best bet in managing your mood issues. I am still not 100% certain of the correct "label."  I think what is important is that you are not behaving or functioning the way that you would like to be and are open to making some changes.   I am giving you a mood chart that should help [place] track your mood and give [place] information as to where to go from here.  Please bring it to your April 1st appointment.

## 2012-03-18 NOTE — Progress Notes (Signed)
Lori Huerta presented with her husband Lori Huerta for an initial psychological assessment.  A client information sheet detailing the Behavioral Medicine Service was provided.  The sheet provides details on the service provided and the limits of confidentiality.  Presenting Problem: Lori Huerta reports that she has had mood issues since the age of 26.  She has labeled anxiety most of her life but recently, her PCP, wondered about the diagnosis of Bipolar Disorder.  Lori Huerta is here to figure out what her mental health issues are an dhow best to treat them.  Currently, she takes 40 mg of Prozac and Ativan as needed for times when life throws her a "curve ball."  This can be defined as something very big (her father missing her wedding reception and not caring a bit about her feelings) or something much smaller (a child misbehaving while she is trying to do someone's hair).  She recognizes that most of the time, her reaction is way out of proportion to the event.  She is especially concerned about how this will (is) affecting her parenting.    Relevant Medical History: Problem list and medications reviewed.    Relevant Psychiatric / Psychological History: She thinks she was tried on Lexapro or "something" around age 89 but her father was against medications and she got no support for medication management or therapy management of her mood.  She remembers having one therapy session with no follow-up, largely because of her father's influence.    Family History: Parents are married.  Lori Huerta has two half-sisters on her mother's side and one half-sister on her father's side.  Lori Huerta is the youngest.  Positive mood issues in her mother.  Reports her mother is on at least two medications to manage her mood.  Mom smokes marijuana daily as long as she can remember (from the moment mom gets up to the moment she goes to sleep).  Dad is reported to be an alcoholic and crack addict.  Not sure when last crack use was.  Daily alcohol use  starting from the time he awakens.  Lori Huerta is angry with her mother for staying in this environment and angry with her father for the choices he made while she was a kid.  For instance, she remembers working at [employer] as a teenager while her mom worked at a [employer] in order to pay bills and put food on the table.  Anything extra (dressed for all of the pageants she was in) were on them, yet Dad would take pride in her accomplishments in a public setting.  Says one sister has trouble with mood issues and alcohol use.  Lori Huerta is married to Lori Huerta.  They have been in a relationship for about four years.  She has a son, Lori Huerta (6) from a previous relationship.  She and Lori Huerta have a son, Lori Huerta (1).   History of Abuse: With her husband out of the room, asked this question.  She denied a history of physical or sexual abuse.  Education / Occupation: HS educated with 2 years in community college.  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  Worked at [employer] for several years until she quit when she had issues with a Writer.  Thinks that most employers understand "how she is" and works around her issues (e.g. If she is having trouble with a customer, moves her to another part of [employer]).    Substance Use: Reports  drinking about two times a month - no more than two drinks each time.  Says she knows her limit and does not like to move past it.  Denied history of or current use of other drugs including tobacco.  Stopped use of caffeine after birth of children secondary to urinary issues.  Other: Husband is currently working outside the home.  She feels pressure to work a lot and to not leave work when she is emotionally overloaded because she is the only provider right now.

## 2012-03-18 NOTE — Telephone Encounter (Signed)
States that at last visit, Dr Alvester Morin told her that when she runs out of the Ativan, that he would change it to Klonopin.  She has 2 pills left... [facility] [place]

## 2012-03-18 NOTE — Telephone Encounter (Signed)
Will forward to Dr Newton. 

## 2012-03-19 NOTE — Assessment & Plan Note (Signed)
Lori Huerta presents with her husband.  She is neatly groomed and appropriately dressed.  She maintains good eye contact and is cooperative and attentive.  Her speech is normal in tone, rate and rhythm.  Mood is reported as euthymic with a consistent affect.  Thought process is logical and goal directed.  No evidence of suicidal or homicidal ideation.  Does not appear to be responding to any internal stimuli.  Able to maintain train of thought and concentrate on the questions.  Judgment and insight are average.  CIDI structured interview for Bipolar Disorder indicated a low probability of Bipolar Disorder however it remains a possibility.  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  Currently not in a depressive episode.  No evidence of PTSD or GAD.  Would like to follow-up on how Prozac has improved her functioning (chart indicates it has).  Currently, Ativan works fairly well at extinguishing rage / anxiety attacks after about an hour but patient is interested in preventing them from happening in the first place.  Denies coping mechanisms other than taking Ativan.  Says taking a walk or distraction is impossible - she can't get the thing that has irritated her - out of her head.  Discussed case with Dr. Kathrynn Running.  He and I agree that therapy is a nice option for [employer].  If a change in pharmacotherapy were needed or desired by the patient, Dr. Kathrynn Running thought targeting the irritability with a medication like Depakote might be reasonable.  Patient has IUD for birth control according to the record.  See patient instructions for further plan.

## 2012-03-20 ENCOUNTER — Other Ambulatory Visit: Payer: Self-pay | Admitting: Family Medicine

## 2012-03-20 ENCOUNTER — Encounter (HOSPITAL_COMMUNITY): Payer: Self-pay | Admitting: *Deleted

## 2012-03-20 ENCOUNTER — Emergency Department (HOSPITAL_COMMUNITY)
Admission: EM | Admit: 2012-03-20 | Discharge: 2012-03-20 | Disposition: A | Discharge status: Home / Self Care | Payer: Self-pay | Attending: Emergency Medicine | Admitting: Emergency Medicine

## 2012-03-20 DIAGNOSIS — H60399 Other infective otitis externa, unspecified ear: Secondary | ICD-10-CM | POA: Insufficient documentation

## 2012-03-20 DIAGNOSIS — H6090 Unspecified otitis externa, unspecified ear: Secondary | ICD-10-CM

## 2012-03-20 MED ORDER — ANTIPYRINE-BENZOCAINE 5.4-1.4 % OT SOLN
3.0000 [drp] | Freq: Once | OTIC | Status: AC
  Administered 2012-03-20: 3 [drp] via OTIC
  Filled 2012-03-20: qty 10

## 2012-03-20 MED ORDER — CLONAZEPAM 1 MG PO TABS: 0.5000 mg | ORAL_TABLET | Freq: Two times a day (BID) | ORAL | Status: DC | PRN

## 2012-03-20 MED ORDER — NEOMYCIN-POLYMYXIN-HC 3.5-10000-1 OT SOLN
3.0000 [drp] | Freq: Once | OTIC | Status: AC
  Administered 2012-03-20: 3 [drp] via OTIC
  Filled 2012-03-20: qty 10

## 2012-03-20 NOTE — ED Notes (Signed)
Pt c/o right earache since Monday. States that she thinks she scratched the inside of it.

## 2012-03-20 NOTE — Telephone Encounter (Signed)
Attempted to call pt. Could not leave VM. Attempted to call [facility]. Got a [employer]izon "unavailable" response x 2 when attempting to speak to someone in the pharmacy. Klonopin Rx left up front for patient to pick up. Please make pt aware of this if she calls back. Thank you.

## 2012-03-20 NOTE — ED Provider Notes (Signed)
History     CSN: [id number]  Arrival date & time 03/20/12  1610   First MD Initiated Contact with Patient 03/20/12 1614      Chief Complaint  Patient presents with  . Otalgia    (Consider location/radiation/quality/duration/timing/severity/associated sxs/prior treatment) HPI Comments: Patient was putting the prong of a pick like comb in her right ear (cannot explain why) 4 days ago,  And had sudden onset of pain.  Since she has had continued pain and occasional drainage of a thin,  Yellow liquid.  Patient is a 26 y.o. female presenting with ear pain. The history is provided by the patient.  Otalgia This is a new problem. The current episode started more than 2 days ago. There is pain in the right ear. The problem occurs constantly. The problem has been gradually worsening. The pain is moderate. Associated symptoms include ear discharge. Pertinent negatives include no headaches, no hearing loss, no rhinorrhea, no sore throat, no abdominal pain, no neck pain, no cough and no rash. Her past medical history is significant for tympanostomy tube. Her past medical history does not include chronic ear infection.    History reviewed. No pertinent past medical history.  Past Surgical History  Procedure Date  . Cesarean section     History reviewed. No pertinent family history.  History  Substance Use Topics  . Smoking status: Never Smoker   . Smokeless tobacco: Not on file  . Alcohol Use: Yes     occasionally    OB History    Grav Para Term Preterm Abortions TAB SAB Ect Mult Living                  Review of Systems  Constitutional: Negative for fever.  HENT: Positive for ear pain and ear discharge. Negative for hearing loss, congestion, sore throat, rhinorrhea, neck pain, neck stiffness and tinnitus.   Eyes: Negative.   Respiratory: Negative for cough, chest tightness and shortness of breath.   Cardiovascular: Negative for chest pain.  Gastrointestinal: Negative for nausea  and abdominal pain.  Genitourinary: Negative.   Musculoskeletal: Negative for joint swelling and arthralgias.  Skin: Negative.  Negative for rash and wound.  Neurological: Negative for dizziness, weakness, light-headedness, numbness and headaches.  Hematological: Negative.   Psychiatric/Behavioral: Negative.     Allergies  Review of patient's allergies indicates no known allergies.  Home Medications   Current Outpatient Rx  Name Route Sig Dispense Refill  . CLONAZEPAM 1 MG PO TABS Oral Take 0.5 tablets (0.5 mg total) by mouth 2 (two) times daily as needed for anxiety. 20 tablet 1  . FLUOXETINE HCL 20 MG PO CAPS Oral Take 2 capsules (40 mg total) by mouth daily. 60 capsule 6    BP 116/81  Pulse 62  Temp(Src) 97.8 F (36.6 C) (Oral)  Resp 16  Ht 5\' 6"  (1.676 m)  Wt 128 lb (58.06 kg)  BMI 20.66 kg/m2  SpO2 100%  Physical Exam  Nursing note and vitals reviewed. Constitutional: She is oriented to person, place, and time. She appears well-developed and well-nourished.  HENT:  Head: Normocephalic and atraumatic.  Right Ear: Hearing and tympanic membrane normal. There is swelling and tenderness. No drainage. No mastoid tenderness. Tympanic membrane is not injected.  Left Ear: External ear normal.  Nose: Nose normal.  Mouth/Throat: No oropharyngeal exudate.       Edema without erythema of right external canal.  No drainage at present time.  Able to visualize TM with no perforation noted.  Eyes: Conjunctivae are normal.  Neck: Normal range of motion.  Cardiovascular: Normal rate, regular rhythm, normal heart sounds and intact distal pulses.   Pulmonary/Chest: Effort normal and breath sounds normal. She has no wheezes.  Abdominal: Soft. Bowel sounds are normal. There is no tenderness.  Musculoskeletal: Normal range of motion.  Neurological: She is alert and oriented to person, place, and time.  Skin: Skin is warm and dry.  Psychiatric: She has a normal mood and affect.    ED  Course  Procedures (including critical care time)  Labs Reviewed - No data to display No results found.   1. External otitis       MDM  Auralgan,  Cortisporin - meds given in ed.  Recheck by pcp if not improved over the next week.  Advised to avoid putting any objects in her ear.        Candis Musa, PA 03/20/12 1714

## 2012-03-20 NOTE — Discharge Instructions (Signed)
Otitis Externa Otitis externa ("swimmer's ear") is a germ (bacterial) or fungal infection of the outer ear canal (from the eardrum to the outside of the ear). Swimming in dirty water may cause swimmer's ear. It also may be caused by moisture in the ear from water remaining after swimming or bathing. Often the first signs of infection may be itching in the ear canal. This may progress to ear canal swelling, redness, and pus drainage, which may be signs of infection. HOME CARE INSTRUCTIONS   Apply the antibiotic drops to the ear canal as prescribed by your doctor.   This can be a very painful medical condition. A strong pain reliever may be prescribed.   Only take over-the-counter or prescription medicines for pain, discomfort, or fever as directed by your caregiver.   If your caregiver has given you a follow-up appointment, it is very important to keep that appointment. Not keeping the appointment could result in a chronic or permanent injury, pain, hearing loss and disability. If there is any problem keeping the appointment, you must call back to this facility for assistance.  PREVENTION   It is important to keep your ear dry. Use the corner of a towel to wick water out of the ear canal after swimming or bathing.   Avoid scratching in your ear. This can damage the ear canal or remove the protective wax lining the canal and make it easier for germs (bacteria) or a fungus to grow.   You may use ear drops made of rubbing alcohol and vinegar after swimming to prevent future "swimmer's ear" infections. Make up a small bottle of equal parts white vinegar and alcohol. Put 3 or 4 drops into each ear after swimming.   Avoid swimming in lakes, polluted water, or poorly chlorinated pools.  SEEK MEDICAL CARE IF:   An oral temperature above 102 F (38.9 C) develops.   Your ear is still painful after 3 days and shows signs of getting worse (redness, swelling, pain, or pus).  MAKE SURE YOU:   Understand  these instructions.   Will watch your condition.   Will get help right away if you are not doing well or get worse.  Document Released: 12/17/2005 Document Revised: 12/06/2011 Document Reviewed: 07/23/2008 [facility] Patient Information 2012 [place], [place].    Apply the cortisporin drops to your right ear - 4 drops in your right ear 3 times daily for 7 days.  You may apply the auralgan drops - 2 in your right ear up to 4 times daily for pain relief - apply this  5 minutes after the antibiotic has been put in to make sure the antibiotic gets absorbed.  See your doctor for a recheck if not better over the next week.

## 2012-03-22 NOTE — ED Provider Notes (Signed)
Medical screening examination/treatment/procedure(s) were performed by non-physician practitioner and as supervising physician I was immediately available for consultation/collaboration.  Patrecia Veiga T Shahida Schnackenberg, MD 03/22/12 1011 

## 2012-03-24 ENCOUNTER — Telehealth: Payer: Self-pay | Admitting: Psychology

## 2012-03-24 NOTE — Telephone Encounter (Signed)
Lori Huerta left a VM Friday afternoon at 1:34.  I am off on Friday afternoons so just picking up her VM this morning.  She was concerned about running out of her medication.  Noted Dr. [place] [facility] documentation attempting to get in touch with her.  I tried to call her today.  Neither of her personal numbers accept VM messages and she is not working today.  Will await follow-up from her.

## 2012-03-31 ENCOUNTER — Ambulatory Visit (INDEPENDENT_AMBULATORY_CARE_PROVIDER_SITE_OTHER): Payer: Self-pay | Admitting: Psychology

## 2012-03-31 DIAGNOSIS — F39 Unspecified mood [affective] disorder: Secondary | ICD-10-CM

## 2012-03-31 NOTE — Assessment & Plan Note (Signed)
Report of mood today is euthymic.  Affect is consistent.  Her thoughts are clear and goal directed.  Her report is sounding more like Bipolar Disorder with some rapid cycling.  She had the experience at least two times of awakening in a "mood."  When she was at her highest (this past Saturday), she elected to do shots with her father.  In the past, when energetic, she has noted spending money recklessly as well.  According to her mood charting, hours slept vary between 5 and 8 per night with no clear association with mood.    Told her I would discuss Klonopin / Ativan issue with Dr. Alvester Morin.  It is possible 0.25 mg of Klonopin would not make her feel "drunk."  The Ativan did not have this effect but she likes that the Klonopin seems to work longer.  Will try to call her back with our recommendations but she does not have minutes on her phone and I forgot to get her home phone number today before she left.    Will follow on April 17th for both [facility] and [facility].  See patient instructions for further plan.

## 2012-03-31 NOTE — Patient Instructions (Addendum)
Please schedule a follow-up for:  Behavioral Medicine April 17th at 3:00. We talked about your language - how you talk to yourself.  When you find yourself saing things like "I can't."  Look to re-evaluate.  As long as you say you can't - you won't.  You can change language to say "I am working on doing it differently."  "I didn't like how I responded there - I wish I had done..." as a way to start to change the way you view the world. I think we are going keep the [facility] appointment for April 17th at 11:00. We will discuss medications then.

## 2012-03-31 NOTE — Progress Notes (Addendum)
Cherryl presents for follow-up.  She has had a couple of rough spots since we last met.  She got the prescription for Clonopin and has used it.  She recognizes that it works longer than the Ativan but she struggles with feeling "drunk" which is not a feeling she got when taking Ativan.  She doesn't have this experience all of the time.  If she is a 10 / 10 it "works like it should" but if she is at a lower level of anxiety / anger then she can end up with difficulty functioning.  She does not feel comfortable at work nor being solely responsible for her children when feeling this way.  Discussed cultural issues and the impact of that and her upbringing on her view of the world.  For instance, she reported that being small framed and educated / successful can make her a target.  Being a physical aggressor helps because people won't mess with her.  She did note that being described as the "mean one" or people asking "if she took [her] medication" any time she gets angry is frustrating.  Completed a mood chart and noted a three day period where she was in a high mood (+3).  This was followed by two days of low mood (-3).  Of 13 days, 9 days were marked as either extremely high or extremely low.  Additionally, her anxiety and irritability is either a 3 or none.    She mentioned her mom takes Zoloft, amitriptyline and one other medicine for mood / anxiety.

## 2012-04-16 ENCOUNTER — Ambulatory Visit (INDEPENDENT_AMBULATORY_CARE_PROVIDER_SITE_OTHER): Payer: Self-pay | Admitting: Psychology

## 2012-04-16 DIAGNOSIS — F3189 Other bipolar disorder: Secondary | ICD-10-CM

## 2012-04-16 DIAGNOSIS — F39 Unspecified mood [affective] disorder: Secondary | ICD-10-CM

## 2012-04-16 DIAGNOSIS — F3181 Bipolar II disorder: Secondary | ICD-10-CM

## 2012-04-16 MED ORDER — LORAZEPAM 0.5 MG PO TABS: 0.5000 mg | ORAL_TABLET | Freq: Every day | ORAL | Status: AC | PRN

## 2012-04-16 MED ORDER — DIVALPROEX SODIUM ER 500 MG PO TB24: 1000.0000 mg | ORAL_TABLET | Freq: Every day | ORAL | Status: DC

## 2012-04-16 NOTE — Assessment & Plan Note (Signed)
Report of mood is depressed.  Affect is a little brighter since this morning.  She has a range of emotion including some tears when talking about her upbringing.  She has very complex feelings regarding her family of origin.  As far as I can tell, they rarely if ever express love in what I would call a healthy parental fashion.  The one example Lori Huerta was able to provide from her mom was really serving the mother - not Lori Huerta.  Discussed what this might mean in terms of getting on a healthier path.  Not only does she love her parents, but she was taught that if she does not "honor thy mother and father" she will go to hell.  Will need to look at healthy limit setting here.  Also - need to look at limit setting with her sons - especially Lori Huerta (6).  Out of guilt and a sense that he deserves the "perfect" childhood (she is serious about this when she says perfect), she likely behaves in less than healthy ways.  Will continue to discuss.  Would do well I think with CBT - understanding core beliefs and how that influences her behavior.  Scheduled for April 25th at 11:00 for follow-up.

## 2012-04-16 NOTE — Patient Instructions (Signed)
Please schedule a follow-up for: May 22nd at 9:00 Take one pill a day for a week with dinner and then two a day.  Side effects might include feeling sleepy or stomach upset.  Taking medicine with a good sized meal will help with the stomach issues.  If you have any trouble tolerating the medicine, please call [phone].

## 2012-04-16 NOTE — Progress Notes (Signed)
  Subjective:    Patient ID: Lori Huerta, female    DOB: 1986-02-21, 26 y.o.   MRN: [id number]  HPI    Review of Systems     Objective:   Physical Exam        Assessment & Plan:

## 2012-04-16 NOTE — Assessment & Plan Note (Addendum)
Report of mood is depressed.  Affect is consistent.  She has energy but appears sad.  Evidence of mood instability both hypomania and depression.  Discussed treatment options.  Depakote was thought to be a good option for mood stability and to help manage irritability.  Since she had already stopped the Prozac and money is an issue (she pays $8 a month for 40 mg of Prozac), elected to move forward just with Depakote and Ativan as a bridge.  Dr. Kathrynn Running did not think there was a big difference in safety and tolerability between ativan and klonopin and patient tolerates ativan better so switch was made.  Encouraged complete abstinence from alcohol use.  See patient instructions for further details.  She has reliable birth control in the form of an IUD.

## 2012-04-16 NOTE — Progress Notes (Signed)
Lori Huerta presents for beh med follow-up.  She was seen for an initial [facility] appointment earlier today.  We discussed impression from that and talked a lot about her family both her family of origin and her immediate family.  Initially, Lori Huerta was very resistant to the label Bipolar Disorder and so we did not name it in her earlier appointment.  This afternoon she reports that after research and thinking about things from beh med appointments, she thinks that having a name for this will help her manage it better.  Discussed Bipolar II and what she can do, in addition to pharmacologic treatment, to manage her mood.  Lori Huerta is worried about best to protect her boys and how to identify early any issues that they might have.

## 2012-04-16 NOTE — Progress Notes (Signed)
Lori Huerta presents for an initial [facility] appointment.  The majority of the assessment had already taken place in earlier Beh-Med visits.  The chart was reviewed with Dr. Kathrynn Running and this visit was focused on establishing a clearer diagnosis and recommending treatment.  Since her last visit with me, Man reported some especially hurtful exchanges with her family.  She was told that her mother took "every drug short of heroin" while pregnant with [name] and her father followed up with "we used to put beer in your bottle so you would go to sleep."  Isabel has been in a depressed mood since that experience (over a week).  It has affected both her work and home function.    While [name] notes triggers for depressed moods that last a week or more, she also notes moods that are energetic and high that last several days.  During these periods of time, she does note a decreased need for sleep.  She also notes increased irritability with the slightest provocation.    She has been out of her Prozac for about one week and did not refill secondary to not wanting to waste money if we decided she didn't need it.  She thinks it has helped with her low moods but then notes that she has been significantly depressed (as she feels today) while taking it.    Reviewed alcohol history again.  She reports no more than two to three drinks at a sitting about once a month.  Of note:  Did not charge for this visit secondary to seeing her later today for individual therapy.  Will charge for that visit.

## 2012-04-21 ENCOUNTER — Telehealth: Payer: Self-pay | Admitting: Psychology

## 2012-04-21 NOTE — Telephone Encounter (Signed)
Samona called stating the person at [facility] said her prescription would be over $100.  I called the pharmacy and spoke to [facility] who looked up divalproex ER 500 mg and said that 60 tabs would be $28.81 even if she is not a member at [employer].  Relayed the information to [place] who said that this was more doable from a cost prospective.

## 2012-04-24 ENCOUNTER — Ambulatory Visit (INDEPENDENT_AMBULATORY_CARE_PROVIDER_SITE_OTHER): Payer: Self-pay | Admitting: Psychology

## 2012-04-24 DIAGNOSIS — F3181 Bipolar II disorder: Secondary | ICD-10-CM

## 2012-04-24 DIAGNOSIS — F3189 Other bipolar disorder: Secondary | ICD-10-CM

## 2012-04-24 NOTE — Patient Instructions (Addendum)
Please schedule a follow-up for:  May 6th at 10:00. We talked about emotional control and limit setting.  You have a right to be mad, sad, glad or scared.  And you will feel these emotions in response to a number of different situations.  You can choose to set limits assertively or aggressively.  We talked about the pros and cons of each.  You agree that assertiveness at work is necessary and are on the fence about what needs to happen in your marriage.  It would take an agreement on both of your ends to change this pattern. For right now, you are committing to not threatening divorce when you get angry.  It is not healthy.  And you don't really mean it.  That is your end. On his end, you agreed to ask him if he would not bring up your medication when you are feeling mad, sad or scared.  Asking you, "Did you take your medicine?" is not helpful and in fact puts you on the defensive.  It also makes you not want to take your medicine.  He might prompt you ahead of stressful situations to take your anxiety medication because this has been helpful in the past.  You might - decide to forgive him for hurting your feelings and telling him that you have might help you do that.   Being ambivalent (unsure or questioning or going back and forth) about taking medication is very normal.  Next time you have that thought ask yourself, "Is what I was doing before medication effective?"  And "How long can I commit to taking this medication daily to see if this might help me be healthier?"

## 2012-04-24 NOTE — Assessment & Plan Note (Signed)
Report of mood is euthymic today.  Affect is consistent.  Lori Huerta is smart and insightful.  The cultural differences still play a role as I am encouraging her to shift from an aggressive style of limit setting to an assertive one where she communicates her feelings, sets a limit and ACCEPTS an apology if it is offered.  This is completely foreign to her and feels like weakness.  She is afraid if she moves away from aggressively setting limits, she will get walked on.  She does not buy that it could actually bring her closer to her husband and in fact, closeness might not be the desired goal as it would leave her more vulnerable.  Tricky dynamics and we are trying to negotiate them in a way that allows her the best shot at consistent, stable emotional regulation and behavior.    Interestingly, one thought she has is:  "If you love me, you would never say or do anything that hurts me."  Really hammered home the fallibility of people (herself included) and that owning up to mistakes made, doing what one can to rectify them and being forgiven is a much more realistic approach to relationships.    See patient instructions for further details.

## 2012-04-24 NOTE — Progress Notes (Signed)
Lori Huerta presents early for her therapy appointment.  She had two "bad days" recently and wants to talk about them.  She reports she will take her 3rd dose of Depakote tonight.  She stopped the Prozac as instructed and is taking the Klonopin she had left over as needed.  She has not yet picked up the Ativan prescription.    Discussed situations with her husband and at work.  Using a CBT model, looked at feelings, thoughts and behaviors.  We did not get time to look at the physiology piece of it.

## 2012-04-29 ENCOUNTER — Emergency Department (HOSPITAL_COMMUNITY)
Admission: EM | Admit: 2012-04-29 | Discharge: 2012-04-29 | Disposition: A | Discharge status: Home / Self Care | Payer: Self-pay | Attending: Emergency Medicine | Admitting: Emergency Medicine

## 2012-04-29 ENCOUNTER — Encounter (HOSPITAL_COMMUNITY): Payer: Self-pay

## 2012-04-29 DIAGNOSIS — X58XXXA Exposure to other specified factors, initial encounter: Secondary | ICD-10-CM | POA: Insufficient documentation

## 2012-04-29 DIAGNOSIS — L089 Local infection of the skin and subcutaneous tissue, unspecified: Secondary | ICD-10-CM | POA: Insufficient documentation

## 2012-04-29 MED ORDER — LIDOCAINE HCL (PF) 1 % IJ SOLN
5.0000 mL | Freq: Once | INTRAMUSCULAR | Status: AC
  Administered 2012-04-29: 5 mL
  Filled 2012-04-29: qty 5

## 2012-04-29 NOTE — ED Notes (Addendum)
Pt has noted edema left upper lip from a dermal implanted in July 2012. Pt reports site became swollen and infected over that past few days. No drainage noted at this time. Denies fever, and pus at this time. Dr Juleen China in, numbed site and removed upper lip piercing. Pt tolerated well.

## 2012-04-29 NOTE — ED Notes (Signed)
Pt had dermal implant placed last year, area now swollen and red, was told to come to er to have surgically removed.

## 2012-04-29 NOTE — Discharge Instructions (Signed)
Foreign Body  A foreign body is something in your body that should not be there. This may have been caused by a puncture wound or other injury. Puncture wounds become easily infected. This happens when bacteria (germs) get under the skin. Rusty nails and similar foreign bodies are often dirty and carry germs on them.   TREATMENT    A foreign body is usually removed if this can be easily done right after it happens.   Sometimes they are left in and removed at a later surgery. They may be left in indefinitely if they will not cause later problems.   The following are general instructions in caring for your wound.  HOME CARE INSTRUCTIONS    A dressing, depending on the location of the wound, may have been applied. This may be changed once per day or as instructed. If the dressing sticks, it may be soaked off with soapy water or hydrogen peroxide.   Only take over-the-counter or prescription medicines for pain, discomfort, or fever as directed by your caregiver.   Be aware that your body will work to remove the foreign substance. That is, the foreign body may work itself out of the wound. That is normal.   You may have received a recommendation to follow up with your physician or a specialist. It is very important to call for or keep follow-up appointments in order to avoid infection or other complications.  SEEK IMMEDIATE MEDICAL CARE IF:    There is redness, swelling, or increasing pain in the wound.   You notice a foul smell coming from the wound or dressing.   Pus is coming from the wound.   An unexplained oral temperature above 102 F (38.9 C) develops, or as your caregiver suggests.   There is increasing pain in the wound.  If you did not receive a tetanus shot today because you did not recall when your last one was given, check with your caregiver's office and determine if one is needed. Generally for a "dirty" wound, you should receive a tetanus booster if you have not had one in the last five  years. If you have a "clean" wound, you should receive a tetanus booster if you have not had one within the last ten years.  If you have a foreign body that needs removal and this was not done today, make sure you know how you are to follow up and what is the plan of action for taking care of this. It is your responsibility to follow up on this.  MAKE SURE YOU:    Understand these instructions.   Will watch your condition.   Will get help right away if you are not doing well or get worse.  Document Released: 06/12/2001 Document Revised: 12/06/2011 Document Reviewed: 08/05/2008  ExitCare Patient Information 2012 [employer].

## 2012-04-29 NOTE — ED Provider Notes (Signed)
History    26 year old female for rodding her face. Patient had a "dermal implant" placed about her left lip about a year ago. The past several weeks she has had intermittent drainage from the area mild swelling and pain. She's tried removing it at home without success. No fevers or chills. No difficulty breathing or swallowing. Denies history of diabetes.  CSN: [id number]  Arrival date & time 04/29/12  1301   First MD Initiated Contact with Patient 04/29/12 1324      Chief Complaint  Patient presents with  . Wound Infection    (Consider location/radiation/quality/duration/timing/severity/associated sxs/prior treatment) HPI  History reviewed. No pertinent past medical history.  Past Surgical History  Procedure Date  . Cesarean section     No family history on file.  History  Substance Use Topics  . Smoking status: Never Smoker   . Smokeless tobacco: Not on file  . Alcohol Use: Yes     occasionally    OB History    Grav Para Term Preterm Abortions TAB SAB Ect Mult Living                  Review of Systems   Review of symptoms negative unless otherwise noted in HPI.   Allergies  Review of patient's allergies indicates no known allergies.  Home Medications   Current Outpatient Rx  Name Route Sig Dispense Refill  . DIVALPROEX SODIUM ER 500 MG PO TB24 Oral Take 2 tablets (1,000 mg total) by mouth daily. 60 tablet 1    BP 115/73  Pulse 84  Temp(Src) 97.9 F (36.6 C) (Oral)  Resp 17  Ht 5\' 6"  (1.676 m)  Wt 131 lb (59.421 kg)  BMI 21.14 kg/m2  SpO2 100%  Physical Exam  Nursing note and vitals reviewed. Constitutional: She appears well-developed and well-nourished. No distress.  HENT:  Head: Normocephalic and atraumatic.       What appears to be an earring back partially protruding from an area about a centimeter and a half above the left upper lip. Some crusting around where it exits the skin. Mild tenderness. No surrounding erythema. No swelling  appreciated. No intraoral lesions noted. Posterior pharynx is clear. Patient is handling secretions. There is no tongue elevation. Submental tissues are soft. There is no adenopathy. Neck is supple.  Eyes: Conjunctivae are normal. Right eye exhibits no discharge. Left eye exhibits no discharge.  Neck: Neck supple.  Cardiovascular: Normal rate, regular rhythm and normal heart sounds.  Exam reveals no gallop and no friction rub.   No murmur heard. Pulmonary/Chest: Effort normal and breath sounds normal. No respiratory distress.  Musculoskeletal: She exhibits no edema and no tenderness.  Neurological: She is alert.  Skin: Skin is warm and dry.  Psychiatric: She has a normal mood and affect. Her behavior is normal. Thought content normal.    ED Course  FOREIGN BODY REMOVAL Date/Time: 04/29/2012 1:51 PM Performed by: Raeford Razor Authorized by: Raeford Razor Consent: Verbal consent obtained. Risks and benefits: risks, benefits and alternatives were discussed Patient identity confirmed: verbally with patient and arm band Body area: skin Anesthesia: local infiltration Local anesthetic: lidocaine 1% without epinephrine Anesthetic total: 1 ml Patient restrained: no Patient cooperative: yes Removal mechanism: forceps Dressing: dressing applied Depth: subcutaneous Post-procedure assessment: foreign body removed Patient tolerance: Patient tolerated the procedure well with no immediate complications. Comments: Partially 1 cc of lidocaine without epinephrine was infiltrated locally. The surrounding skin was cleaned with Betadine prior. Foreign body was grasped with a hemostat  and removed with gentle manipulation. Minimal bleeding. No incision required. No purulence noted.    (including critical care time)  Labs Reviewed - No data to display No results found.   1. Foreign body of face, superficial, infected       MDM  38-year-old female with a subcutaneous foreign body of face. He is  referred to procedure no. Does not appear to be secondarily infected. Local wound care was discussed. Return precautions for discussed as well. Outpatient followup otherwise.        Raeford Razor, MD 04/29/12 [phone]

## 2012-05-05 ENCOUNTER — Telehealth: Payer: Self-pay | Admitting: Psychology

## 2012-05-05 ENCOUNTER — Ambulatory Visit: Payer: Self-pay | Admitting: Psychology

## 2012-05-05 NOTE — Telephone Encounter (Signed)
Lori Huerta missed her appointment.  I called to follow-up.  Her phone doesn't allow VM.  Tried work - she is not there today.  Will await follow-up from her.

## 2012-05-21 ENCOUNTER — Ambulatory Visit (INDEPENDENT_AMBULATORY_CARE_PROVIDER_SITE_OTHER): Payer: Self-pay | Admitting: Psychology

## 2012-05-21 DIAGNOSIS — F3189 Other bipolar disorder: Secondary | ICD-10-CM

## 2012-05-21 MED ORDER — LITHIUM CARBONATE 300 MG PO CAPS: ORAL_CAPSULE | ORAL | Status: DC

## 2012-05-21 NOTE — Assessment & Plan Note (Signed)
Report of mood is euthymic.  Affect was normal with a little bit of lability that was appropriate to her speech content.  Modest psychomotor activation.  Denied SI / HI current and during her recent mood disturbance.  Thought content was to flee.  Description of most recent mood disturbance fits time frame and symptoms of a manic episode but not likely with enough impairment to make the change from Bipolar II.    Discussed treatment options including Lithium vs. An atypical antipsychotic (likely Abilify).  Due to cost mostly, opted for a trial of lithium.  AE discussed.  See patient instructions for further plan.

## 2012-05-21 NOTE — Progress Notes (Signed)
Lori Huerta presents for follow-up.  She missed her follow-up for Beh Med secondary to forgetting.  She was not able to tolerate the Depakote.  Even at the lowest dose of Depakote, her stomach could not tolerate it.    Lori Huerta reports her mood is fine today with reasonably normal energy.  She reported a week period of time of intense irritability and anger.  Associated symptoms include decreased sleep, doing things that were not typical for her, racing thoughts and difficulty concentrating.  She does not remember parenting her children during this period of time.  She knows they were cared for and she remembers that they were around but she does not remember much interaction.  This bothers her.

## 2012-05-21 NOTE — Patient Instructions (Signed)
Please schedule a follow up with Dr. Pascal Lux for: Wednesday, May 29th at 9:00. Please schedule a follow up for [facility] for: Wednesday, June 5th at 10:00. I am sorry that your stomach did not like the Depakote.   Dr. Kathrynn Running recommended a medication called Lithium.  Take 300 mg a day for three days.  Then 600 mg a day for three days.  Then 900 mg.  ALWAYS with a meal.  If 900 mg causes difficulty, go back to 600 mg.  If you have trouble tolerating any lithium, call Dr. Pascal Lux at [phone].

## 2012-05-21 NOTE — Progress Notes (Signed)
  Subjective:    Patient ID: Lori Huerta, female    DOB: 08/26/1986, 25 y.o.   MRN: [id number]  HPI    Review of Systems     Objective:   Physical Exam        Assessment & Plan:   

## 2012-05-28 ENCOUNTER — Ambulatory Visit: Payer: Self-pay | Admitting: Psychology

## 2012-06-02 ENCOUNTER — Emergency Department (HOSPITAL_COMMUNITY)
Admission: EM | Admit: 2012-06-02 | Discharge: 2012-06-02 | Disposition: A | Discharge status: Home / Self Care | Payer: Self-pay | Attending: Emergency Medicine | Admitting: Emergency Medicine

## 2012-06-02 ENCOUNTER — Encounter (HOSPITAL_COMMUNITY): Payer: Self-pay | Admitting: *Deleted

## 2012-06-02 ENCOUNTER — Emergency Department (HOSPITAL_COMMUNITY): Payer: Self-pay

## 2012-06-02 DIAGNOSIS — R109 Unspecified abdominal pain: Secondary | ICD-10-CM | POA: Insufficient documentation

## 2012-06-02 HISTORY — DX: Calculus of kidney: N20.0

## 2012-06-02 LAB — URINALYSIS, ROUTINE W REFLEX MICROSCOPIC
Bilirubin Urine: NEGATIVE
Glucose, UA: NEGATIVE mg/dL
Hgb urine dipstick: NEGATIVE
Ketones, ur: NEGATIVE mg/dL
pH: 7.5 (ref 5.0–8.0)

## 2012-06-02 MED ORDER — ONDANSETRON 8 MG PO TBDP
8.0000 mg | ORAL_TABLET | Freq: Once | ORAL | Status: AC
  Administered 2012-06-02: 8 mg via ORAL
  Filled 2012-06-02: qty 1

## 2012-06-02 MED ORDER — HYDROCODONE-ACETAMINOPHEN 5-325 MG PO TABS: ORAL_TABLET | ORAL | Status: DC

## 2012-06-02 MED ORDER — KETOROLAC TROMETHAMINE 60 MG/2ML IM SOLN
60.0000 mg | Freq: Once | INTRAMUSCULAR | Status: AC
  Administered 2012-06-02: 60 mg via INTRAMUSCULAR
  Filled 2012-06-02: qty 2

## 2012-06-02 MED ORDER — OXYCODONE-ACETAMINOPHEN 5-325 MG PO TABS
1.0000 | ORAL_TABLET | Freq: Once | ORAL | Status: AC
  Administered 2012-06-02: 1 via ORAL
  Filled 2012-06-02: qty 1

## 2012-06-02 MED ORDER — NAPROXEN 500 MG PO TABS: 500.0000 mg | ORAL_TABLET | Freq: Two times a day (BID) | ORAL | Status: DC

## 2012-06-02 NOTE — ED Notes (Signed)
Lt flank pain with nausea,  Hx of kidney stones.

## 2012-06-02 NOTE — Discharge Instructions (Signed)
Flank Pain Flank pain refers to pain that is located on the side of the body between the upper abdomen and the back. It can be caused by many things. CAUSES  Some of the more common causes of flank pain include:  Muscle strain.   Muscle spasms.   A disease of your spine (vertebral disk disease).   A lung infection (pneumonia).   Fluid around your lungs (pulmonary edema).   A kidney infection.   Kidney stones.   A very painful skin rash on only one side of your body (shingles).   Gallbladder disease.  DIAGNOSIS  Blood tests, urine tests, and X-rays may help your caregiver determine what is wrong. TREATMENT  The treatment of pain depends on the cause. Your caregiver will determine what treatment will work best for you. HOME CARE INSTRUCTIONS   Home care will depend on the cause of your pain.   Some medications may help relieve the pain. Take medication for relief of pain as directed by your caregiver.   Tell your caregiver about any changes in your pain.   Follow up with your caregiver.  SEEK IMMEDIATE MEDICAL CARE IF:   Your pain is not controlled with medication.   The pain increases.   You have abdominal pain.   You have shortness of breath.   You have persistent nausea or vomiting.   You have swelling in your abdomen.   You feel faint or pass out.   You have a temperature by mouth above 102 F (38.9 C), not controlled by medicine.  MAKE SURE YOU:   Understand these instructions.   Will watch your condition.   Will get help right away if you are not doing well or get worse.  Document Released: 02/07/2006 Document Revised: 12/06/2011 Document Reviewed: 06/03/2010 ExitCare Patient Information 2012 [employer]. 

## 2012-06-02 NOTE — ED Notes (Signed)
Pt seen by EDPa for initial assessment. 

## 2012-06-02 NOTE — ED Provider Notes (Addendum)
History     CSN: [id number]  Arrival date & time 06/02/12  1741   First MD Initiated Contact with Patient 06/02/12 1822      Chief Complaint  Patient presents with  . Flank Pain    (Consider location/radiation/quality/duration/timing/severity/associated sxs/prior treatment) HPI Comments: patient c/o pain to her left flank area.  She also c/o intermittent nausea but denies vomiting.  States she has a hx of kidney stones and states the pain is similar.  She denies abd pain, vaginal discharge or bleeding or chest pain.    Patient is a 26 y.o. female presenting with flank pain. The history is provided by the patient.  Flank Pain This is a new problem. Episode onset: on the day of ed arrival. The problem occurs constantly. The problem has been waxing and waning. Associated symptoms include nausea and urinary symptoms. Pertinent negatives include no abdominal pain, arthralgias, congestion, coughing, fever, headaches, numbness, rash, sore throat, swollen glands, visual change, vomiting or weakness. Nothing aggravates the symptoms. She has tried nothing for the symptoms. The treatment provided no relief.    Past Medical History  Diagnosis Date  . Kidney stones     Past Surgical History  Procedure Date  . Cesarean section     History reviewed. No pertinent family history.  History  Substance Use Topics  . Smoking status: Never Smoker   . Smokeless tobacco: Not on file  . Alcohol Use: Yes     occasionally    OB History    Grav Para Term Preterm Abortions TAB SAB Ect Mult Living                  Review of Systems  Constitutional: Negative for fever, activity change and appetite change.  HENT: Negative for congestion and sore throat.   Respiratory: Negative for cough.   Gastrointestinal: Positive for nausea. Negative for vomiting and abdominal pain.  Genitourinary: Positive for frequency and flank pain. Negative for dysuria, vaginal bleeding, vaginal discharge, difficulty  urinating, vaginal pain, menstrual problem and pelvic pain.  Musculoskeletal: Negative for arthralgias.  Skin: Negative for rash.  Neurological: Negative for weakness, numbness and headaches.  All other systems reviewed and are negative.    Allergies  Review of patient's allergies indicates no known allergies.  Home Medications   Current Outpatient Rx  Name Route Sig Dispense Refill  . LEVONORGESTREL 20 MCG/24HR IU IUD Intrauterine 1 each by Intrauterine route once.    Marland Kitchen LITHIUM CARBONATE 300 MG PO CAPS  Take 3 caps daily ALWAYS with a meal. 90 capsule 1    Per Dr. [facility]  . LORAZEPAM 0.5 MG PO TABS Oral Take 0.5 mg by mouth every 8 (eight) hours.      BP 109/74  Pulse 80  Temp(Src) 98.4 F (36.9 C) (Oral)  Resp 20  Ht 5\' 6"  (1.676 m)  Wt 130 lb (58.968 kg)  BMI 20.98 kg/m2  SpO2 100%  Physical Exam  Nursing note and vitals reviewed. Constitutional: She is oriented to person, place, and time. She appears well-developed and well-nourished. No distress.  HENT:  Head: Normocephalic and atraumatic.  Mouth/Throat: Oropharynx is clear and moist.  Cardiovascular: Normal rate, regular rhythm, normal heart sounds and intact distal pulses.   No murmur heard. Pulmonary/Chest: Effort normal and breath sounds normal.  Abdominal: Soft. She exhibits no distension and no mass. There is no tenderness. There is no rebound and no guarding.  Musculoskeletal: She exhibits no edema.  Neurological: She  is alert and oriented to person, place, and time.  Skin: Skin is warm and dry.    ED Course  Procedures (including critical care time)  Results for orders placed during the hospital encounter of 06/02/12  URINALYSIS, ROUTINE W REFLEX MICROSCOPIC      Component Value Range   Color, Urine STRAW (*) YELLOW   APPearance CLEAR  CLEAR   Specific Gravity, Urine 1.010  1.005 - 1.030   pH 7.5  5.0 - 8.0   Glucose, UA NEGATIVE  NEGATIVE mg/dL   Hgb urine dipstick  NEGATIVE  NEGATIVE   Bilirubin Urine NEGATIVE  NEGATIVE   Ketones, ur NEGATIVE  NEGATIVE mg/dL   Protein, ur NEGATIVE  NEGATIVE mg/dL   Urobilinogen, UA 0.2  0.0 - 1.0 mg/dL   Nitrite NEGATIVE  NEGATIVE   Leukocytes, UA NEGATIVE  NEGATIVE  POCT PREGNANCY, URINE      Component Value Range   Preg Test, Ur NEGATIVE  NEGATIVE    Ct Abdomen Pelvis Wo Contrast  06/02/2012  *RADIOLOGY REPORT*  Clinical Data: Left-sided flank pain.  History of kidney stones.  CT ABDOMEN AND PELVIS WITHOUT CONTRAST  Technique:  Multidetector CT imaging of the abdomen and pelvis was performed following the standard protocol without intravenous contrast.  Comparison: CT of abdomen and pelvis 05/06/2011.  Findings:  Lung Bases: Unremarkable.  Abdomen/Pelvis:  There are no abnormal calcifications within the collecting system of either kidney, along the course of either ureter, or within the lumen of the urinary bladder.  No hydroureteronephrosis or abnormal perinephric stranding to suggest urinary tract obstruction at this time.  The unenhanced appearance of the liver, gallbladder, pancreas, spleen, bilateral adrenal glands and bilateral kidneys is unremarkable.  There is no ascites or pneumoperitoneum and no pathologic distension of bowel.  No definite pathologic lymphadenopathy identified within the abdomen or pelvis.  An IUD is in place within the endometrial canal.  Urinary bladder is completely decompressed.  Musculoskeletal: There are no aggressive appearing lytic or blastic lesions noted in the visualized portions of the skeleton.  IMPRESSION: 1.  No acute findings in the abdomen or pelvis to account for the patient's symptoms.  Specifically, no abnormal urinary tract calculi or signs to suggest urinary tract obstruction at this time. 2.  An IUD is in place.  Original Report Authenticated By: Florencia Reasons, M.D.        MDM    Her patient is feeling better. Symptoms have resolved. Ambulated in the emergency  department without any difficulty. She is nontoxic appearing. There rates her pain and nausea at a 0.   Patient has history of kidney stones. States she is unable to see her urologist at this time due to lack of funds . She agrees to close followup with her primary care physician or to return here if her symptoms worsen.   U/A was negative, vitals stable.  Pt is non-toxic appearing.  I will prescribe naprosyn for pain.    Patient / Family / Caregiver understand and agree with initial ED impression and plan with expectations set for ED visit. Pt stable in ED with no significant deterioration in condition. Pt feels improved after observation and/or treatment in ED.      Sherri Levenhagen L. Zabrina [place], [place] 06/02/12 2042  Sarely Stracener L. [place], [place] 07/07/12 1733

## 2012-06-03 NOTE — ED Provider Notes (Signed)
Medical screening examination/treatment/procedure(s) were performed by non-physician practitioner and as supervising physician I was immediately available for consultation/collaboration.   Kyler Germer, MD 06/03/12 1154 

## 2012-06-04 ENCOUNTER — Ambulatory Visit (INDEPENDENT_AMBULATORY_CARE_PROVIDER_SITE_OTHER): Payer: Self-pay | Admitting: Psychology

## 2012-06-04 DIAGNOSIS — F3181 Bipolar II disorder: Secondary | ICD-10-CM

## 2012-06-04 DIAGNOSIS — F319 Bipolar disorder, unspecified: Secondary | ICD-10-CM

## 2012-06-04 DIAGNOSIS — F3189 Other bipolar disorder: Secondary | ICD-10-CM

## 2012-06-04 NOTE — Progress Notes (Signed)
Lori Huerta presents for follow-up.  She missed her scheduled therapy appointment (second one in a row).  She has not yet been able to see Jaynee Eagles but plans to.  She is taking 900 mg of Lithium and has been for about a week.  It hurts her stomach some but she can tolerate it.  She notes significant kidney pain that she went to the ED for.  CT was negative.  She wonders whether the Lithium could be causing her pain.  Mood is poor.  Ativan use is about 1-2 times this past week.

## 2012-06-04 NOTE — Assessment & Plan Note (Signed)
Report of mood is mad and sad.  Affect is consistent.  She looks uncomfortable.  Thoughts are clear and goal directed.  No evidence of SI / HI.  Tolerating the medicine reasonably well but not without issue.  Patient wants to continue. No signs of efficacy but it is early.  Dr. Kathrynn Running does not think that Lithium is the cause of the kidney pain.    Patient frustrated by pain and not having an answer.  Empathized.  Did not address therapy issue today.    Lithium level today.

## 2012-06-04 NOTE — Patient Instructions (Signed)
Please schedule a follow-up for:  June 26th at 9:00 a.m. I am sorry you are hurting.  You look really uncomfortable. Please get your lab drawn today before you leave.  We will call you with the results. Please consider splitting the dose of Lithium to one in the morning and two in the evening (or vice versa) in hopes of decreasing the impact it has on your stomach. Thanks for coming in.

## 2012-06-06 ENCOUNTER — Emergency Department (HOSPITAL_COMMUNITY)
Admission: EM | Admit: 2012-06-06 | Discharge: 2012-06-06 | Disposition: A | Discharge status: Home / Self Care | Payer: Self-pay | Attending: Emergency Medicine | Admitting: Emergency Medicine

## 2012-06-06 ENCOUNTER — Encounter (HOSPITAL_COMMUNITY): Payer: Self-pay | Admitting: *Deleted

## 2012-06-06 DIAGNOSIS — N390 Urinary tract infection, site not specified: Secondary | ICD-10-CM | POA: Insufficient documentation

## 2012-06-06 DIAGNOSIS — Z975 Presence of (intrauterine) contraceptive device: Secondary | ICD-10-CM | POA: Insufficient documentation

## 2012-06-06 DIAGNOSIS — Z79899 Other long term (current) drug therapy: Secondary | ICD-10-CM | POA: Insufficient documentation

## 2012-06-06 DIAGNOSIS — R109 Unspecified abdominal pain: Secondary | ICD-10-CM | POA: Insufficient documentation

## 2012-06-06 LAB — URINE MICROSCOPIC-ADD ON

## 2012-06-06 LAB — URINALYSIS, ROUTINE W REFLEX MICROSCOPIC
Bilirubin Urine: NEGATIVE
Glucose, UA: NEGATIVE mg/dL
Specific Gravity, Urine: 1.015 (ref 1.005–1.030)
pH: 7 (ref 5.0–8.0)

## 2012-06-06 MED ORDER — TRAMADOL-ACETAMINOPHEN 37.5-325 MG PO TABS: ORAL_TABLET | ORAL | Status: AC

## 2012-06-06 MED ORDER — LIDOCAINE HCL (PF) 1 % IJ SOLN
INTRAMUSCULAR | Status: AC
  Administered 2012-06-06: 2.1 mL
  Filled 2012-06-06: qty 5

## 2012-06-06 MED ORDER — CEFTRIAXONE SODIUM 1 G IJ SOLR
1.0000 g | Freq: Once | INTRAMUSCULAR | Status: AC
  Administered 2012-06-06: 1 g via INTRAMUSCULAR
  Filled 2012-06-06: qty 10

## 2012-06-06 MED ORDER — CEPHALEXIN 500 MG PO CAPS: 500.0000 mg | ORAL_CAPSULE | Freq: Three times a day (TID) | ORAL | Status: AC

## 2012-06-06 NOTE — Discharge Instructions (Signed)
Drink plenty of fluids. Take the ultracet for pain with ibuprofen 600 mg 4 times a day.  Take the antibiotics until gone, even if you feel better. Call Dr Hatfield Blas office to have them check your urine culture in the next 2-3 days. Return to the ED if you get a fever, vomiting or seem worse.

## 2012-06-06 NOTE — ED Notes (Signed)
Instructions and prescriptions reviewed; f/u information provided; verbalizes understanding; a&ox4; in no distress.  Left in c/o spouse for transport home.

## 2012-06-06 NOTE — ED Notes (Signed)
Pt c/o left side flank pain that radiates around to left abd area, pt states that the pain started on Sunday, was seen on Monday in er and was advised that if it was not getting any better to return to er for recheck

## 2012-06-06 NOTE — ED Provider Notes (Cosign Needed)
This chart was scribed for Ward Givens, MD by Wallis Mart. The patient was seen in room APA15/[facility]15 and the patient's care was started at 12:30 PM.   CSN: [id number]  Arrival date & time 06/06/12  1117   First MD Initiated Contact with Patient 06/06/12 1157      Chief Complaint  Patient presents with  . Flank Pain    (Consider location/radiation/quality/duration/timing/severity/associated sxs/prior treatment) HPI Lori Huerta is a 26 y.o. female who presents to the Emergency Department complaining of sudden onset, persistence of constant, gradually worsening, moderate to severe left sided flank pain onset 5 days ago. The pain radiates around to the left abdominal area. Sitting in certain positions worsens the pain. Pt was seen in ED 4 days ago,  CT scan of abdomen revealed no kidney stones, told to come back in 2 days if pain did not go away. Pt c/o nausea, urinary frequency. Denies burning w/ urination, fever, discharge, chills, vomiting. There are no other associated symptoms and no other alleviating or aggravating factors. She has never had this before.   [facility]    Past Medical History  Diagnosis Date  . Kidney stones     Past Surgical History  Procedure Date  . Cesarean section     No family history on file.  History  Substance Use Topics  . Smoking status: Never Smoker   . Smokeless tobacco: Not on file  . Alcohol Use: Yes     occasionally  employed  OB History    Grav Para Term Preterm Abortions TAB SAB Ect Mult Living                  Review of Systems  10 Systems reviewed and all are negative for acute change except as noted in the HPI.    Allergies  Review of patient's allergies indicates no known allergies.  Home Medications   Current Outpatient Rx  Name Route Sig Dispense Refill  . LITHIUM CARBONATE 300 MG PO CAPS Oral Take 900 mg by mouth daily. Take 3 caps daily ALWAYS with a meal.    . LORAZEPAM 0.5  MG PO TABS Oral Take 0.5 mg by mouth daily as needed. Anxiety    . NAPROXEN 500 MG PO TABS Oral Take 1 tablet (500 mg total) by mouth 2 (two) times daily. 20 tablet 0  . CEPHALEXIN 500 MG PO CAPS Oral Take 1 capsule (500 mg total) by mouth 3 (three) times daily. 30 capsule 0  . LEVONORGESTREL 20 MCG/24HR IU IUD Intrauterine 1 each by Intrauterine route once.    . TRAMADOL-ACETAMINOPHEN 37.5-325 MG PO TABS  2 tabs po QID prn pain 16 tablet 0    BP 125/88  Pulse 81  Temp(Src) 98.1 F (36.7 C) (Oral)  Resp 17  Ht 5\' 6"  (1.676 m)  Wt 130 lb (58.968 kg)  BMI 20.98 kg/m2  SpO2 98%  Physical Exam  Nursing note and vitals reviewed. Constitutional: She is oriented to person, place, and time. She appears well-developed and well-nourished.  Non-toxic appearance. She does not appear ill. No distress.  HENT:  Head: Normocephalic and atraumatic.  Right Ear: External ear normal.  Left Ear: External ear normal.  Nose: Nose normal. No mucosal edema or rhinorrhea.  Mouth/Throat: Oropharynx is clear and moist and mucous membranes are normal. No dental abscesses or uvula swelling.  Eyes: Conjunctivae and EOM are normal. Pupils are equal, round, and reactive to light.  Neck: Normal  range of motion and full passive range of motion without pain. Neck supple. No tracheal deviation present.  Cardiovascular: Normal rate, regular rhythm and normal heart sounds.  Exam reveals no gallop and no friction rub.   No murmur heard. Pulmonary/Chest: Effort normal and breath sounds normal. No respiratory distress. She has no wheezes. She has no rhonchi. She has no rales. She exhibits no tenderness and no crepitus.  Abdominal: Soft. Normal appearance and bowel sounds are normal. She exhibits no distension. There is no tenderness. There is no rebound and no guarding.       No pain to palpation of abdomen  Genitourinary:       Mild left flank tenderness  Musculoskeletal: Normal range of motion. She exhibits no edema and  no tenderness.  Neurological: She is alert and oriented to person, place, and time. She has normal strength. No cranial nerve deficit or sensory deficit.  Skin: Skin is warm, dry and intact. No rash noted. No erythema. No pallor.  Psychiatric: She has a normal mood and affect. Her speech is normal and behavior is normal. Her mood appears not anxious.    ED Course  Procedures (including critical care time)   Medications  cefTRIAXone (ROCEPHIN) injection 1 g (1 g Intramuscular Given 06/06/12 1256)  lidocaine (XYLOCAINE) 1 % injection (2.1 mL  Given 06/06/12 1256)   Pt concerned about taking pain medications because she is working and states it makes her sleepy.  Pt has UTI and mild pyelonephritis without fever. Hopefully will respond quickly to antibiotics.    DIAGNOSTIC STUDIES: Oxygen Saturation is 98% on room air, normal by my interpretation.    COORDINATION OF CARE:  Results for orders placed during the hospital encounter of 06/06/12  URINALYSIS, ROUTINE W REFLEX MICROSCOPIC      Component Value Range   Color, Urine YELLOW  YELLOW    APPearance CLEAR  CLEAR    Specific Gravity, Urine 1.015  1.005 - 1.030    pH 7.0  5.0 - 8.0    Glucose, UA NEGATIVE  NEGATIVE (mg/dL)   Hgb urine dipstick MODERATE (*) NEGATIVE    Bilirubin Urine NEGATIVE  NEGATIVE    Ketones, ur NEGATIVE  NEGATIVE (mg/dL)   Protein, ur 30 (*) NEGATIVE (mg/dL)   Urobilinogen, UA 1.0  0.0 - 1.0 (mg/dL)   Nitrite POSITIVE (*) NEGATIVE    Leukocytes, UA MODERATE (*) NEGATIVE   URINE MICROSCOPIC-ADD ON      Component Value Range   WBC, UA TOO NUMEROUS TO COUNT  <3 (WBC/hpf)   RBC / HPF TOO NUMEROUS TO COUNT  <3 (RBC/hpf)   Bacteria, UA MANY (*) RARE   GLUCOSE, CAPILLARY      Component Value Range   Glucose-Capillary 68 (*) 70 - 99 (mg/dL)   Laboratory interpretation consistent with urinary tract infection  Urinalysis on 6/3 shows  SG 1.10, pH6.0, urobilinogen 0.2 otherwise neg, no micro done because dip was  normal  June 02, 2012 Clinical Data: Left-sided flank pain. History of kidney stones.  CT ABDOMEN AND PELVIS WITHOUT CONTRAST  Technique: Multidetector CT imaging of the abdomen and pelvis was  performed following the standard protocol without intravenous  contrast.  Comparison: CT of abdomen and pelvis 05/06/2011.  Findings:  Lung Bases: Unremarkable.  Abdomen/Pelvis: There are no abnormal calcifications within the  collecting system of either kidney, along the course of either  ureter, or within the lumen of the urinary bladder. No  hydroureteronephrosis or abnormal perinephric stranding to suggest  urinary tract obstruction at this time.  The unenhanced appearance of the liver, gallbladder, pancreas,  spleen, bilateral adrenal glands and bilateral kidneys is  unremarkable. There is no ascites or pneumoperitoneum and no  pathologic distension of bowel. No definite pathologic  lymphadenopathy identified within the abdomen or pelvis. An IUD is  in place within the endometrial canal. Urinary bladder is  completely decompressed.  Musculoskeletal: There are no aggressive appearing lytic or blastic  lesions noted in the visualized portions of the skeleton.  IMPRESSION:  1. No acute findings in the abdomen or pelvis to account for the  patient's symptoms. Specifically, no abnormal urinary tract  calculi or signs to suggest urinary tract obstruction at this time.  2. An IUD is in place.  Original Report Authenticated By: Florencia Reasons, M.D.    1. Urinary tract infection     New Prescriptions   CEPHALEXIN (KEFLEX) 500 MG CAPSULE    Take 1 capsule (500 mg total) by mouth 3 (three) times daily.   TRAMADOL-ACETAMINOPHEN (ULTRACET) 37.5-325 MG PER TABLET    2 tabs po QID prn pain   Plan discharge  Devoria Albe, MD, Armando Gang   MDM          Ward Givens, MD 06/06/12 1327

## 2012-06-09 LAB — URINE CULTURE
Culture  Setup Time: 201306080338
Special Requests: NORMAL

## 2012-06-19 ENCOUNTER — Telehealth: Payer: Self-pay | Admitting: Family Medicine

## 2012-06-19 NOTE — Telephone Encounter (Signed)
Last OV with Dr.Newton 3/13 .Lori Huerta

## 2012-06-19 NOTE — Telephone Encounter (Signed)
Only labs. Lithium level is WNL. Lorenda Hatchet, Renato Battles

## 2012-06-19 NOTE — Telephone Encounter (Signed)
Requesting lab results from last visit, did not see a letter sent to pt.

## 2012-06-25 ENCOUNTER — Ambulatory Visit: Payer: Self-pay | Admitting: Psychology

## 2012-07-07 NOTE — ED Provider Notes (Signed)
Medical screening examination/treatment/procedure(s) were performed by non-physician practitioner and as supervising physician I was immediately available for consultation/collaboration.   Glynn Octave, MD 07/07/12 2122

## 2012-07-11 ENCOUNTER — Telehealth: Payer: Self-pay | Admitting: Psychology

## 2012-07-11 NOTE — Telephone Encounter (Addendum)
Lori Huerta called to request a refill of Ativan.  She has missed her last two [facility] appts and she missed her [facility] follow-up as well.  She says she is taking the Lithium and has enough but it is not doing enough and she needs a refill of her Ativan.  She has one pill left.  She says she didn't attend her [facility] follow-up because she didn't have insurance.  She is waiting on [facility] but does not have the paperwork she needs (I don't know the details of the hold up).  She did not call to discuss any of this.  Told Lori Huerta that Dr. Kathrynn Running would not likely refill medication given her difficulty with following up but that I would try to contact him.  Left him a voice mail describing the situation and my plan.  I will call her back if his recommendation is different than the one I provided.    I talked with Dr. Zachery Dauer in the preceptor room who agreed with the plan.  Tried to call Lori Huerta at work (she said she would be there to 4:00) at [phone].  The person said she was not there today.  Called her at [phone] and spoke with her sister who said she would relay a message to call me.

## 2012-07-18 ENCOUNTER — Emergency Department (HOSPITAL_COMMUNITY)
Admission: EM | Admit: 2012-07-18 | Discharge: 2012-07-18 | Disposition: A | Discharge status: Home / Self Care | Payer: Medicaid Other | Attending: Emergency Medicine | Admitting: Emergency Medicine

## 2012-07-18 ENCOUNTER — Encounter (HOSPITAL_COMMUNITY): Payer: Self-pay | Admitting: *Deleted

## 2012-07-18 ENCOUNTER — Emergency Department (HOSPITAL_COMMUNITY): Payer: Medicaid Other

## 2012-07-18 DIAGNOSIS — R071 Chest pain on breathing: Secondary | ICD-10-CM | POA: Insufficient documentation

## 2012-07-18 DIAGNOSIS — R0789 Other chest pain: Secondary | ICD-10-CM

## 2012-07-18 MED ORDER — IBUPROFEN 800 MG PO TABS: 800.0000 mg | ORAL_TABLET | Freq: Three times a day (TID) | ORAL | Status: AC

## 2012-07-18 MED ORDER — HYDROCODONE-ACETAMINOPHEN 5-325 MG PO TABS: ORAL_TABLET | ORAL | Status: AC

## 2012-07-18 NOTE — ED Notes (Signed)
Pain in right lateral rib cage, tender on palpation

## 2012-07-18 NOTE — ED Provider Notes (Signed)
History     CSN: [id number]  Arrival date & time 07/18/12  1715   First MD Initiated Contact with Patient 07/18/12 1841      Chief Complaint  Patient presents with  . Chest Pain    (Consider location/radiation/quality/duration/timing/severity/associated sxs/prior treatment) HPI Comments: Patient complains of pain to the right lateral chest wall for 4 days. She states the pain is worse with movement or palpation and improves with rest. 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 She denies shortness of breath, spinal tenderness, cough, abdominal pain, fever or chills.  The history is provided by the patient.    Past Medical History  Diagnosis Date  . Kidney stones     Past Surgical History  Procedure Date  . Cesarean section     History reviewed. No pertinent family history.  History  Substance Use Topics  . Smoking status: Never Smoker   . Smokeless tobacco: Not on file  . Alcohol Use: Yes     occasionally    OB History    Grav Para Term Preterm Abortions TAB SAB Ect Mult Living                  Review of Systems  Constitutional: Negative for fever, activity change and appetite change.  HENT: Negative for congestion, sore throat, neck pain and neck stiffness.   Respiratory: Negative for cough, chest tightness, shortness of breath and wheezing.   Cardiovascular: Positive for chest pain.       Right rib pain  Gastrointestinal: Negative for nausea, vomiting, abdominal pain and constipation.  Genitourinary: Negative for dysuria, hematuria, flank pain, decreased urine volume and difficulty urinating.       Perineal numbness or incontinence of urine or feces  Musculoskeletal: Negative for joint swelling.  Skin: Negative for rash.  Neurological: Negative for weakness and numbness.  All other systems reviewed and are negative.    Allergies  Review of patient's allergies indicates no known allergies.  Home  Medications   Current Outpatient Rx  Name Route Sig Dispense Refill  . LITHIUM CARBONATE 300 MG PO CAPS Oral Take 900 mg by mouth daily. Take 3 caps daily ALWAYS with a meal.    . LORAZEPAM 0.5 MG PO TABS Oral Take 0.5 mg by mouth daily as needed. Anxiety    . LEVONORGESTREL 20 MCG/24HR IU IUD Intrauterine 1 each by Intrauterine route once.      BP 119/80  Pulse 85  Temp 98.5 F (36.9 C) (Oral)  Resp 18  Ht 5\' 6"  (1.676 m)  Wt 130 lb (58.968 kg)  BMI 20.98 kg/m2  SpO2 100%  Physical Exam  Nursing note and vitals reviewed. Constitutional: She is oriented to person, place, and time. She appears well-developed and well-nourished. No distress.  HENT:  Head: Normocephalic and atraumatic.  Mouth/Throat: Oropharynx is clear and moist.  Eyes: EOM are normal. Pupils are equal, round, and reactive to light.  Neck: Normal range of motion. Neck supple.  Cardiovascular: Normal rate, regular rhythm, normal heart sounds and intact distal pulses.   No murmur heard. Pulmonary/Chest: Effort normal and breath sounds normal. No respiratory distress. Chest wall is not dull to percussion. She exhibits tenderness. She exhibits no mass, no bony tenderness, no laceration, no crepitus, no deformity and no swelling.    Abdominal: Soft. She exhibits no distension. There is no tenderness. There is no rebound and no guarding.  Musculoskeletal: She exhibits no  edema.  Lymphadenopathy:    She has no cervical adenopathy.  Neurological: She is alert and oriented to person, place, and time. She exhibits normal muscle tone. Coordination normal.  Skin: Skin is warm and dry.    ED Course  Procedures (including critical care time)  Labs Reviewed - No data to display Dg Ribs Unilateral W/chest Right  07/18/2012  *RADIOLOGY REPORT*  Clinical Data: Chest pain lateral to the right inframammary area and axilla for 4 days.  RIGHT RIBS AND CHEST - 3+ VIEW  Comparison: 12/05/2006  Findings: Cardiomediastinal  silhouette is within normal limits. The lungs are free of focal consolidations and pleural effusions. No evidence for pulmonary edema.  No pneumothorax or acute fracture.  IMPRESSION: No evidence for acute  abnormality.  Original Report Authenticated By: Patterson Hammersmith, M.D.        MDM    Patient has localized tenderness to palpation of the lateral right chest wall. No bruising, edema, or crepitus. Abdomen is soft and nontender. Patient is alert nontoxic appearing. Vital signs are stable. No hypoxia. Tachypnea or tachycardia. Clinical suspicion for PE is low.  PERC score < 2   The patient appears reasonably screened and/or stabilized for discharge and I doubt any other medical condition or other [facility] requiring further screening, evaluation, or treatment in the ED at this time prior to discharge.   Prescribed:  norco #20 ibuprofen  Leinaala Catanese L. Kevonna Nolte, [place] 07/19/12 1622

## 2012-07-18 NOTE — ED Notes (Signed)
Rt antero-lat chest pain, onset on Monday,No injury, no cough or cold sx.

## 2012-07-20 NOTE — ED Provider Notes (Signed)
Medical screening examination/treatment/procedure(s) were performed by non-physician practitioner and as supervising physician I was immediately available for consultation/collaboration.  Joleene Burnham L Josephine Wooldridge, MD 07/20/12 0116 

## 2012-07-31 ENCOUNTER — Encounter: Payer: Self-pay | Admitting: Family Medicine

## 2012-07-31 ENCOUNTER — Ambulatory Visit (INDEPENDENT_AMBULATORY_CARE_PROVIDER_SITE_OTHER): Payer: Medicaid Other | Admitting: Family Medicine

## 2012-07-31 VITALS — BP 126/85 | HR 83 | Ht 66.0 in | Wt 140.0 lb

## 2012-07-31 DIAGNOSIS — M62830 Muscle spasm of back: Secondary | ICD-10-CM | POA: Insufficient documentation

## 2012-07-31 DIAGNOSIS — M5380 Other specified dorsopathies, site unspecified: Secondary | ICD-10-CM

## 2012-07-31 MED ORDER — CYCLOBENZAPRINE HCL 5 MG PO TABS: ORAL_TABLET | ORAL | Status: DC

## 2012-07-31 MED ORDER — IBUPROFEN 200 MG PO TABS: 800.0000 mg | ORAL_TABLET | Freq: Three times a day (TID) | ORAL | Status: AC | PRN

## 2012-07-31 NOTE — Progress Notes (Signed)
Subjective:     Patient ID: Lori Huerta, female   DOB: May 24, 1986, 26 y.o.   MRN: [id number]  HPI 26 yo F presents for evaluation of R sided rib pain x 2.5 weeks. The pain started on 7/15. It came on gradually. She denies fall or injury. The pain was moderate and severe when she moved sharply.  She also had some soft tissue swelling w/o bruising. She went to [facility] ED on 7/19, CXR was normal. She was diagnosed with a contusion and was prescribed Vicodin. She has not been taking the Vicodin because it makes her sleepy. She has a 26 yo and 26 yo at home.  Her pain is unchanged. She denies N/V/fever/dysuria/SOB/   Review of Systems As per HPI Objective:   Physical Exam BP 126/85  Pulse 83  Ht 5\' 6"  (1.676 m)  Wt 140 lb (63.504 kg)  BMI 22.60 kg/m2 General appearance: alert, cooperative and no distress Back: symmetric, no curvature. ROM normal. No CVA tenderness. TTP on R lateral rib at T 8 vertebral level. Pain exacerbated by contralateral bending to the side. Discrete trigger point appreciated at area of maximal tenderness.   Assessment and Plan:

## 2012-07-31 NOTE — Patient Instructions (Addendum)
Kayna,  Thank you for coming in today. Your side pain is from a muscle spasm: 1. Heat: heating pad, hot shower or bath 2. Massage: firm to try to release muscle 3. Flexeril 1-2 tbs twice daily  4. Motrin 800 mg 3 x per days as needed for pain.  This can be unpredictable and take time to resolve. Come back if still present after two weeks.   Dr. Armen Pickup

## 2012-07-31 NOTE — Assessment & Plan Note (Addendum)
A: muscle spasm of the back. P:  Per AVS 1. Heat: heating pad, hot shower or bath 2. Massage: firm to try to release muscle 3. Flexeril 1-2 tbs twice daily  4. Motrin 800 mg 3 x per days as needed for pain. 5/ f/u in two weeks or sooner if needed

## 2012-08-11 ENCOUNTER — Telehealth: Payer: Self-pay | Admitting: Psychology

## 2012-08-11 ENCOUNTER — Ambulatory Visit (INDEPENDENT_AMBULATORY_CARE_PROVIDER_SITE_OTHER): Payer: Medicaid Other | Admitting: Family Medicine

## 2012-08-11 ENCOUNTER — Encounter: Payer: Self-pay | Admitting: Family Medicine

## 2012-08-11 VITALS — BP 115/78 | HR 108 | Ht 66.0 in | Wt 134.0 lb

## 2012-08-11 DIAGNOSIS — F3189 Other bipolar disorder: Secondary | ICD-10-CM

## 2012-08-11 DIAGNOSIS — F3181 Bipolar II disorder: Secondary | ICD-10-CM

## 2012-08-11 MED ORDER — LORAZEPAM 0.5 MG PO TABS: 0.5000 mg | ORAL_TABLET | Freq: Every day | ORAL | Status: DC | PRN

## 2012-08-11 NOTE — Telephone Encounter (Signed)
Marta called back. Scheduled MDC for 08/13/12 at 9:30.  She says her insurance is back on track.    I told her the following: -  If she is unable to make the appointment she needs to call me. -  If she misses the appointment without a phone call, I won't be able to schedule her back in my clinic. She voiced an understanding and was able to repeat the appointment date and time back to me.

## 2012-08-11 NOTE — Telephone Encounter (Signed)
Patient left VM requesting MDC follow-up.  Called her back at [phone] and left a VM.

## 2012-08-11 NOTE — Patient Instructions (Addendum)
I will refill your ativan for 30 days, however you need to keep your appointment with  Dr. Pascal Lux and the mood disorder clinic as well as Dr. Ermalinda Memos.

## 2012-08-12 NOTE — Progress Notes (Signed)
  Subjective:    Patient ID: Lori Huerta, female    DOB: 1986/03/04, 26 y.o.   MRN: [id number]  HPI  1. Bipolar:  Patient here for refill of lorazepam.  She has been followed by the MDO clinic in the past, but has missed multiple appts. Her reason for missing appt is that she lost her insurance and could not afford to come in.  She now has insurance again.  She states that she has not used lorazepam in about two weeks.  She states that she has difficulty dealing with her day to day anxiety, to the point that she is unable to work at times.  When asked what things or situations make her anxious she states "anything and everything"  She often finds herself having difficulty sleeping at night due to anxiety and racing thoughts.  When she gets anxiety attacks she describes feelings of shortness of breath, chest tightness, and racing heart.  In addition she has also stopped her lithium a couple weeks ago as well because it was causing "too many side effects" including upset stomach and headaches.  She plans to f/u with Dr. Pascal Lux this week.  She denies SI/HI or plan.    Review of Systems Per HPI    Objective:   Physical Exam  Constitutional: She appears well-nourished.       Tearful throughout exam  HENT:  Head: Normocephalic and atraumatic.  Neurological: She is alert.  Psychiatric: Her speech is normal and behavior is normal. Thought content normal. She does not exhibit a depressed mood.          Assessment & Plan:

## 2012-08-12 NOTE — Assessment & Plan Note (Signed)
Has been poor to follow up. I explained to her that I would give her 30 lorazepam given severity of anxiety symptoms and interfering with work,  but would need to follow up with Dr. Pascal Lux as well as new PCP.  SHe agrees to this.  Explained that she would likely need to be on something to replace lithium.  She is resistant to starting something else right now.  She has used depakote and recently stopped lithium.  She does not think she has used lamictal.

## 2012-08-13 ENCOUNTER — Ambulatory Visit (INDEPENDENT_AMBULATORY_CARE_PROVIDER_SITE_OTHER): Payer: Medicaid Other | Admitting: Psychology

## 2012-08-13 DIAGNOSIS — F3181 Bipolar II disorder: Secondary | ICD-10-CM

## 2012-08-13 DIAGNOSIS — F3189 Other bipolar disorder: Secondary | ICD-10-CM

## 2012-08-13 MED ORDER — LURASIDONE HCL 20 MG PO TABS: 20.0000 mg | ORAL_TABLET | Freq: Once | ORAL | Status: DC

## 2012-08-13 NOTE — Progress Notes (Signed)
Lori Huerta presents for follow-up after several missed appointments.  She got her insurance back and is interested in treatment for Bipolar Disorder.  She stopped the Lithium she was taking secondary to stomach pain (cramping).  She noted that it was helpful in that she had no days where she awoke in a bad mood while taking it and her angry outbursts were significantly less in terms of frequency and intensity.  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    She continues with anxiety in addition to her main symptom which is anger / irritability.  She describes panic like symptoms that can be both triggered and untriggered.  No sleep disturbance noted.    Impairment in function both at work and at home.  Ambivalent about taking medication but reports believing in the diagnosis of Bipolar Disorder.  Questions whether she could manage it on her own (without meds or therapy) but recognizes that this thinking comes when her mood is off (elevated or irritable).

## 2012-08-13 NOTE — Assessment & Plan Note (Addendum)
Report of mood today is irritable.  Affect is within normal limits.  Her thoughts are clear and goal directed.  Discussed treatment options.  She is concerned about not tolerating side effects and does not want to consider a medication that might promote weight gain (fam history of obesity has her concerned).    Dr. Kathrynn Running recommended initiating Latuda.  Discussed potential side effects and expectations for efficacy.  See patient instructions for further plan.    Lori Huerta agreed she would call if she is not able to attend her follow-up appointment.  She is aware I can not schedule her back in this clinic if she misses another appointment without a phone call.

## 2012-08-13 NOTE — Patient Instructions (Addendum)
It was so good to see you today Lori Huerta.  I am glad you were able to get your insurance straightened out. Dr. Kathrynn Running recommended starting [name].  Take one pill in the evening with a meal.   We are also recommending Dialetical Behavioral Therapy.  We think this will be especially useful. I think [facility] is a good place to go for therapy.  We gave you information on that today. Call me [phone] with questions and concerns.   Please schedule a follow-up for:  08/26/12 at 9:00.

## 2012-08-26 ENCOUNTER — Telehealth: Payer: Self-pay | Admitting: Psychology

## 2012-08-26 ENCOUNTER — Ambulatory Visit: Payer: Medicaid Other | Admitting: Psychology

## 2012-08-26 NOTE — Telephone Encounter (Signed)
Lori Huerta left a VM at 7:47 last night to say she would not attend her appointment today.  She said she recently started the medicine so meeting next week would make more sense for her.  She said she would call to reschedule.

## 2012-09-29 ENCOUNTER — Encounter: Payer: Self-pay | Admitting: Family Medicine

## 2012-09-29 ENCOUNTER — Ambulatory Visit (INDEPENDENT_AMBULATORY_CARE_PROVIDER_SITE_OTHER): Payer: Medicaid Other | Admitting: Family Medicine

## 2012-09-29 VITALS — BP 112/82 | HR 78 | Temp 98.8°F | Wt 136.0 lb

## 2012-09-29 DIAGNOSIS — R109 Unspecified abdominal pain: Secondary | ICD-10-CM

## 2012-09-29 MED ORDER — POLYETHYLENE GLYCOL 3350 17 GM/SCOOP PO POWD: 17.0000 g | Freq: Two times a day (BID) | ORAL | Status: DC | PRN

## 2012-09-29 NOTE — Assessment & Plan Note (Signed)
Symptoms and benign exam consistent with constipation/abdomina gas and bloating.  Will rx miralax- discussed titration up to soft stools, then can back down.  Also discussed increasing fiber and water in diet may eliminate long term need for medication.  Follow-up prn

## 2012-09-29 NOTE — Progress Notes (Signed)
  Subjective:    Patient ID: Lori Huerta, female    DOB: Nov 05, 1986, 26 y.o.   MRN: [id number]  HPI  2-3 months of abdominal pain  Reports since she had a section 2 years ago, had had problems with constipation intermittently.  Notes in past 2-3 months, after she eats, she gets sharp crampy pains in stomach.  Worse in front and sometimes stabbing pain on left.  Any foods and all 3 meals can trigger it.  Not relate dto dairy.  Cramping lasts for "not long" and reports it is not long enough to take pain medicine.    Reports having a bowel movement at best every 3 days.  No diarrhea.  No periods due to mirena.  No vaginal discharge, dysuria, dyspareunia.  Deneis symptoms of sour taste, epigastric pain of reflux.  Reports no new medicines.   After exam patient reports past hospitalization for abdominal pain years ago which no reason was found except for constipation.  Review of SystemsDenies weight changes, and change in appetite.     Objective:   Physical Exam GEN: Alert & Oriented, No acute distress, with 26 year old CV:  Regular Rate & Rhythm, no murmur Respiratory:  Normal work of breathing, CTAB Abd:  + BS, soft, no tenderness to palpation Ext: no pre-tibial edema        Assessment & Plan:

## 2012-09-29 NOTE — Patient Instructions (Addendum)
Use miralax at a dose to keep your  Bowel movements soft and regular.    Many people find that increasing dietary fiber and water intake will help with constipation  High Fiber Diet A high fiber diet changes your normal diet to include more whole grains, legumes, fruits, and vegetables. Changes in the diet involve replacing refined carbohydrates with unrefined foods. The calorie level of the diet is essentially unchanged. The Dietary Reference Intake (recommended amount) for adult males is 38 g per day. For adult females, it is 25 g per day. Pregnant and lactating women should consume 28 g of fiber per day. Fiber is the intact part of a plant that is not broken down during digestion. Functional fiber is fiber that has been isolated from the plant to provide a beneficial effect in the body. PURPOSE  Increase stool bulk.   Ease and regulate bowel movements.   Lower cholesterol.  INDICATIONS THAT YOU NEED MORE FIBER  Constipation and hemorrhoids.   Uncomplicated diverticulosis (intestine condition) and irritable bowel syndrome.   Weight management.   As a protective measure against hardening of the arteries (atherosclerosis), diabetes, and cancer.  NOTE OF CAUTION If you have a digestive or bowel problem, ask your caregiver for advice before adding high fiber foods to your diet. Some of the following medical problems are such that a high fiber diet should not be used without consulting your caregiver:  Acute diverticulitis (intestine infection).   Partial small bowel obstructions.   Complicated diverticular disease involving bleeding, rupture (perforation), or abscess (boil, furuncle).   Presence of autonomic neuropathy (nerve damage) or gastric paresis (stomach cannot empty itself).  GUIDELINES FOR INCREASING FIBER  Start adding fiber to the diet slowly. A gradual increase of about 5 more grams (2 slices of whole-wheat bread, 2 servings of most fruits or vegetables, or 1 bowl of high  fiber cereal) per day is best. Too rapid an increase in fiber may result in constipation, flatulence, and bloating.   Drink enough water and fluids to keep your urine clear or pale yellow. Water, juice, or caffeine-free drinks are recommended. Not drinking enough fluid may cause constipation.   Eat a variety of high fiber foods rather than one type of fiber.   Try to increase your intake of fiber through using high fiber foods rather than fiber pills or supplements that contain small amounts of fiber.   The goal is to change the types of food eaten. Do not supplement your present diet with high fiber foods, but replace foods in your present diet.  INCLUDE A VARIETY OF FIBER SOURCES  Replace refined and processed grains with whole grains, canned fruits with fresh fruits, and incorporate other fiber sources. White rice, white breads, and most bakery goods contain little or no fiber.   Brown whole-grain rice, buckwheat oats, and many fruits and vegetables are all good sources of fiber. These include: broccoli, Brussels sprouts, cabbage, cauliflower, beets, sweet potatoes, white potatoes (skin on), carrots, tomatoes, eggplant, squash, berries, fresh fruits, and dried fruits.   Cereals appear to be the richest source of fiber. Cereal fiber is found in whole grains and bran. Bran is the fiber-rich outer coat of cereal grain, which is largely removed in refining. In whole-grain cereals, the bran remains. In breakfast cereals, the largest amount of fiber is found in those with "bran" in their names. The fiber content is sometimes indicated on the label.   You may need to include additional fruits and vegetables each day.  In baking, for 1 cup white flour, you may use the following substitutions:   1 cup whole-wheat flour minus 2 tbs.    cup white flour plus  cup whole-wheat flour.  Document Released: 12/17/2005 Document Revised: 12/06/2011 Document Reviewed: 10/25/2009 [facility] Patient  Information 2012 [place], [place].

## 2012-10-01 ENCOUNTER — Emergency Department (HOSPITAL_COMMUNITY)
Admission: EM | Admit: 2012-10-01 | Discharge: 2012-10-02 | Disposition: A | Discharge status: Home / Self Care | Payer: Medicaid Other | Attending: Emergency Medicine | Admitting: Emergency Medicine

## 2012-10-01 ENCOUNTER — Encounter (HOSPITAL_COMMUNITY): Payer: Self-pay | Admitting: Emergency Medicine

## 2012-10-01 ENCOUNTER — Emergency Department (HOSPITAL_COMMUNITY): Payer: Medicaid Other

## 2012-10-01 DIAGNOSIS — I10 Essential (primary) hypertension: Secondary | ICD-10-CM | POA: Insufficient documentation

## 2012-10-01 DIAGNOSIS — R1110 Vomiting, unspecified: Secondary | ICD-10-CM

## 2012-10-01 DIAGNOSIS — F411 Generalized anxiety disorder: Secondary | ICD-10-CM | POA: Insufficient documentation

## 2012-10-01 DIAGNOSIS — R109 Unspecified abdominal pain: Secondary | ICD-10-CM | POA: Insufficient documentation

## 2012-10-01 DIAGNOSIS — R112 Nausea with vomiting, unspecified: Secondary | ICD-10-CM | POA: Insufficient documentation

## 2012-10-01 DIAGNOSIS — F319 Bipolar disorder, unspecified: Secondary | ICD-10-CM | POA: Insufficient documentation

## 2012-10-01 HISTORY — DX: Bipolar disorder, unspecified: F31.9

## 2012-10-01 HISTORY — DX: Anxiety disorder, unspecified: F41.9

## 2012-10-01 LAB — URINALYSIS, ROUTINE W REFLEX MICROSCOPIC
Glucose, UA: NEGATIVE mg/dL
Leukocytes, UA: NEGATIVE
Protein, ur: NEGATIVE mg/dL
Specific Gravity, Urine: >1.03 — ABNORMAL HIGH (ref 1.005–1.030)
pH: 5.5 (ref 5.0–8.0)

## 2012-10-01 LAB — URINE MICROSCOPIC-ADD ON

## 2012-10-01 MED ORDER — ONDANSETRON HCL 4 MG/2ML IJ SOLN
4.0000 mg | Freq: Once | INTRAMUSCULAR | Status: AC
  Administered 2012-10-01: 4 mg via INTRAVENOUS

## 2012-10-01 MED ORDER — SODIUM CHLORIDE 0.9 % IV SOLN
1000.0000 mL | INTRAVENOUS | Status: DC
  Administered 2012-10-02: 1000 mL via INTRAVENOUS

## 2012-10-01 MED ORDER — ONDANSETRON HCL 4 MG/2ML IJ SOLN
4.0000 mg | Freq: Once | INTRAMUSCULAR | Status: DC
  Filled 2012-10-01: qty 2

## 2012-10-01 MED ORDER — SODIUM CHLORIDE 0.9 % IV SOLN
1000.0000 mL | Freq: Once | INTRAVENOUS | Status: AC
  Administered 2012-10-01: 1000 mL via INTRAVENOUS

## 2012-10-01 NOTE — ED Provider Notes (Signed)
History     CSN: [id number]  Arrival date & time 10/01/12  2039   First MD Initiated Contact with Patient 10/01/12 2251      Chief Complaint  Patient presents with  . Abdominal Pain  . Emesis    (Consider location/radiation/quality/duration/timing/severity/associated sxs/prior treatment) HPI Comments: Pt states her abdominal pain is primarily L sided.  Describes it as "sharp and crampy".  Has vomited 5 times.  No diarrhea.   No fever or chills.  Was feeling well before eating dinner.  Patient is a 26 y.o. female presenting with abdominal pain and vomiting. The history is provided by the patient. No language interpreter was used.  Abdominal Pain The primary symptoms of the illness include abdominal pain, nausea and vomiting. The primary symptoms of the illness do not include diarrhea. Episode onset: 1700 today.  started ~ 45 minutes after eating [place] food. The onset of the illness was sudden.  The patient states that she believes she is currently not pregnant. The patient has not had a change in bowel habit. Symptoms associated with the illness do not include chills, diaphoresis, heartburn, urgency, hematuria, frequency or back pain.  Emesis  Associated symptoms include abdominal pain. Pertinent negatives include no chills and no diarrhea.    Past Medical History  Diagnosis Date  . Kidney stones   . HYPERTENSION 12/07/2010    Qualifier: Diagnosis of  By: Alvester Morin MD, Viviann Spare    . Bipolar 1 disorder   . Anxiety     Past Surgical History  Procedure Date  . Cesarean section     History reviewed. No pertinent family history.  History  Substance Use Topics  . Smoking status: Never Smoker   . Smokeless tobacco: Not on file  . Alcohol Use: Yes     occasionally    OB History    Grav Para Term Preterm Abortions TAB SAB Ect Mult Living                  Review of Systems  Constitutional: Negative for chills and diaphoresis.  Gastrointestinal: Positive for nausea, vomiting  and abdominal pain. Negative for heartburn and diarrhea.  Genitourinary: Negative for urgency, frequency and hematuria.  Musculoskeletal: Negative for back pain.  All other systems reviewed and are negative.    Allergies  Review of patient's allergies indicates no known allergies.  Home Medications   Current Outpatient Rx  Name Route Sig Dispense Refill  . LORAZEPAM 0.5 MG PO TABS Oral Take 1 tablet (0.5 mg total) by mouth daily as needed. Anxiety 30 tablet 0  . LURASIDONE HCL 20 MG PO TABS Oral Take 1 tablet (20 mg total) by mouth once. In the evening with a meal. 30 tablet 1    Per Dr. Kathrynn Running in [facility].  Marland Kitchen PROMETHAZINE HCL 25 MG RE SUPP  Unwrap and insert 1 PR PRN nausea, vomiting or headache 6 each 0    BP 113/72  Pulse 109  Temp 98.6 F (37 C) (Oral)  Resp 20  Ht 5\' 6"  (1.676 m)  Wt 135 lb (61.236 kg)  BMI 21.79 kg/m2  SpO2 98%  Physical Exam  Nursing note and vitals reviewed. Constitutional: She is oriented to person, place, and time. She appears well-developed and well-nourished. No distress.  HENT:  Head: Normocephalic and atraumatic.  Eyes: EOM are normal.  Neck: Normal range of motion.  Cardiovascular: Normal rate, regular rhythm and normal heart sounds.   Pulmonary/Chest: Effort normal and breath sounds normal.  Abdominal: Soft. Bowel  sounds are normal. She exhibits no distension and no mass. There is no hepatosplenomegaly. There is tenderness in the left upper quadrant and left lower quadrant. There is no rebound, no guarding, no CVA tenderness, no tenderness at McBurney's point and negative Murphy's sign.    Musculoskeletal: Normal range of motion.  Neurological: She is alert and oriented to person, place, and time.  Skin: Skin is warm and dry.  Psychiatric: She has a normal mood and affect. Judgment normal.    ED Course  Procedures (including critical care time)  Labs Reviewed  URINALYSIS, ROUTINE W REFLEX MICROSCOPIC - Abnormal; Notable for the  following:    Specific Gravity, Urine >1.030 (*)     Hgb urine dipstick MODERATE (*)     Ketones, ur 15 (*)     All other components within normal limits  CBC WITH DIFFERENTIAL - Abnormal; Notable for the following:    MCHC 36.3 (*)     Neutrophils Relative 88 (*)     Neutro Abs 8.9 (*)     Lymphocytes Relative 7 (*)     All other components within normal limits  BASIC METABOLIC PANEL - Abnormal; Notable for the following:    Potassium 3.4 (*)     All other components within normal limits  URINE MICROSCOPIC-ADD ON - Abnormal; Notable for the following:    Squamous Epithelial / LPF MANY (*)     Bacteria, UA FEW (*)     All other components within normal limits  LAB REPORT - SCANNED   Dg Abd 1 View  10/02/2012  *RADIOLOGY REPORT*  Clinical Data: Left-sided abdominal pain and vomiting.  ABDOMEN - 1 VIEW  Comparison: None.  Findings: Supine abdomen shows no gaseous bowel dilatation to suggest obstruction.  There is some scattered along the length of a nondistended colon.  The visualized bony structures are unremarkable.  IUD projects over the central pelvis.  IMPRESSION: Nonspecific bowel gas pattern.  IUD is more angulated than typically seen and while this may be related to uterine position, IUD migration within the uterus would also be a consideration.   Original Report Authenticated By: ERIC A. MANSELL, M.D.      1. Vomiting   2. Abdominal pain       MDM          Evalina Field, PA 10/02/12 1631

## 2012-10-01 NOTE — ED Notes (Signed)
Patient complaining of left sided abdominal pain and vomiting starting right after she ate [place] tonight at approximately 1700.

## 2012-10-01 NOTE — ED Notes (Signed)
Pt reporting nausea and vomiting beginning today.  Reports abdominal cramping worse on left.  Denies radiating of pain.

## 2012-10-02 LAB — CBC WITH DIFFERENTIAL/PLATELET
Basophils Absolute: 0 10*3/uL (ref 0.0–0.1)
HCT: 40 % (ref 36.0–46.0)
Lymphocytes Relative: 7 % — ABNORMAL LOW (ref 12–46)
Lymphs Abs: 0.7 10*3/uL (ref 0.7–4.0)
Monocytes Absolute: 0.5 10*3/uL (ref 0.1–1.0)
Neutro Abs: 8.9 10*3/uL — ABNORMAL HIGH (ref 1.7–7.7)
RBC: 4.57 MIL/uL (ref 3.87–5.11)
RDW: 12.6 % (ref 11.5–15.5)
WBC: 10.1 10*3/uL (ref 4.0–10.5)

## 2012-10-02 LAB — BASIC METABOLIC PANEL
CO2: 23 mEq/L (ref 19–32)
Chloride: 103 mEq/L (ref 96–112)
Creatinine, Ser: 0.57 mg/dL (ref 0.50–1.10)
Glucose, Bld: 82 mg/dL (ref 70–99)
Sodium: 139 mEq/L (ref 135–145)

## 2012-10-02 MED ORDER — PROMETHAZINE HCL 25 MG/ML IJ SOLN
12.5000 mg | Freq: Once | INTRAMUSCULAR | Status: AC
  Administered 2012-10-02: 12.5 mg via INTRAVENOUS
  Filled 2012-10-02: qty 1

## 2012-10-02 MED ORDER — SODIUM CHLORIDE 0.9 % IV BOLUS (SEPSIS): 1000.0000 mL | Freq: Once | INTRAVENOUS | Status: DC

## 2012-10-02 MED ORDER — PROMETHAZINE HCL 25 MG RE SUPP: RECTAL | Status: DC

## 2012-10-02 NOTE — ED Provider Notes (Cosign Needed)
The patient left the changes shift after having acute onset of nausea and vomiting after eating [place] food tonight. She reports she was feeling better however she drank some fluids and she feels like her abdominal cramping is returning. She indicates her worse pain is the left lateral mid abdomen. I have added more IV fluids and IV Phenergan to control her nausea and her abdominal cramping and will recheck in a little bit.  02:30 recheck after getting phenergan IV. Pt feeling better. Advised she can be discharged when she feels ready to go home.    Diagnoses that have been ruled out:  None  Diagnoses that are still under consideration:  None  Final diagnoses:  Vomiting  Abdominal pain    New Prescriptions   PROMETHAZINE (PHENERGAN) 25 MG SUPPOSITORY    Unwrap and insert 1 PR PRN nausea, vomiting or headache    Plan discharge  Devoria Albe, MD, Armando Gang   Ward Givens, MD 10/02/12 [phone]

## 2012-10-02 NOTE — Discharge Instructions (Signed)
Drink plenty of fluids (clear liquids) the next 12-24 hours then start the BRAT diet.  Use the phenergan for nausea or vomiting.  Recheck if you get worse.

## 2012-10-05 NOTE — ED Provider Notes (Signed)
Medical screening examination/treatment/procedure(s) were performed by non-physician practitioner and as supervising physician I was immediately available for consultation/collaboration.   Shelda Jakes, MD 10/05/12 2207

## 2012-11-01 ENCOUNTER — Emergency Department (HOSPITAL_COMMUNITY)
Admission: EM | Admit: 2012-11-01 | Discharge: 2012-11-01 | Disposition: A | Discharge status: Home / Self Care | Payer: Medicaid Other | Attending: Emergency Medicine | Admitting: Emergency Medicine

## 2012-11-01 ENCOUNTER — Encounter (HOSPITAL_COMMUNITY): Payer: Self-pay | Admitting: *Deleted

## 2012-11-01 DIAGNOSIS — R5381 Other malaise: Secondary | ICD-10-CM | POA: Insufficient documentation

## 2012-11-01 DIAGNOSIS — F319 Bipolar disorder, unspecified: Secondary | ICD-10-CM | POA: Insufficient documentation

## 2012-11-01 DIAGNOSIS — Z87442 Personal history of urinary calculi: Secondary | ICD-10-CM | POA: Insufficient documentation

## 2012-11-01 DIAGNOSIS — I10 Essential (primary) hypertension: Secondary | ICD-10-CM | POA: Insufficient documentation

## 2012-11-01 DIAGNOSIS — E041 Nontoxic single thyroid nodule: Secondary | ICD-10-CM

## 2012-11-01 DIAGNOSIS — F411 Generalized anxiety disorder: Secondary | ICD-10-CM | POA: Insufficient documentation

## 2012-11-01 LAB — CBC WITH DIFFERENTIAL/PLATELET
Basophils Absolute: 0 10*3/uL (ref 0.0–0.1)
Basophils Relative: 1 % (ref 0–1)
Eosinophils Relative: 1 % (ref 0–5)
HCT: 39 % (ref 36.0–46.0)
Lymphocytes Relative: 37 % (ref 12–46)
MCHC: 35.9 g/dL (ref 30.0–36.0)
Monocytes Absolute: 0.3 10*3/uL (ref 0.1–1.0)
Neutro Abs: 3.2 10*3/uL (ref 1.7–7.7)
Platelets: 302 10*3/uL (ref 150–400)
RDW: 12.5 % (ref 11.5–15.5)
WBC: 5.6 10*3/uL (ref 4.0–10.5)

## 2012-11-01 NOTE — ED Provider Notes (Signed)
History     CSN: [id number]  Arrival date & time 11/01/12  1537   First MD Initiated Contact with Patient 11/01/12 1608      Chief Complaint  Patient presents with  . Neck Pain    (Consider location/radiation/quality/duration/timing/severity/associated sxs/prior treatment) HPI Comments: Lori Huerta presents with a nontender swelling on her right lateral neck which she noticed 3 days ago.  She denies fevers, chills, pain,  difficulty swallowing, redness and rash.  She does report increased fatigue,  But works fulltime and has 2 small children,  So blames fatigue on lack of sleep.  She has had no unexplained weight loss or gain.  The history is provided by the patient.    Past Medical History  Diagnosis Date  . Kidney stones   . HYPERTENSION 12/07/2010    Qualifier: Diagnosis of  By: Alvester Morin MD, Viviann Spare    . Bipolar 1 disorder   . Anxiety     Past Surgical History  Procedure Date  . Cesarean section     No family history on file.  History  Substance Use Topics  . Smoking status: Never Smoker   . Smokeless tobacco: Not on file  . Alcohol Use: Yes     occasionally    OB History    Grav Para Term Preterm Abortions TAB SAB Ect Mult Living                  Review of Systems  Constitutional: Positive for fatigue. Negative for fever and chills.  HENT: Negative for congestion, sore throat, trouble swallowing, neck pain, neck stiffness and voice change.   Eyes: Negative.   Respiratory: Negative for chest tightness and shortness of breath.   Cardiovascular: Negative for chest pain.  Gastrointestinal: Negative for nausea and abdominal pain.  Genitourinary: Negative.   Musculoskeletal: Negative for joint swelling and arthralgias.  Skin: Negative.  Negative for rash and wound.  Neurological: Negative for dizziness, weakness, light-headedness, numbness and headaches.  Hematological: Negative.   Psychiatric/Behavioral: Negative.     Allergies  Review of patient's  allergies indicates no known allergies.  Home Medications   Current Outpatient Rx  Name Route Sig Dispense Refill  . LORAZEPAM 0.5 MG PO TABS Oral Take 1 tablet (0.5 mg total) by mouth daily as needed. Anxiety 30 tablet 0  . LURASIDONE HCL 20 MG PO TABS Oral Take 1 tablet (20 mg total) by mouth once. In the evening with a meal. 30 tablet 1    Per Dr. Kathrynn Running in [facility].    BP 120/86  Pulse 83  Temp 98 F (36.7 C)  Resp 20  Ht 5\' 6"  (1.676 m)  Wt 133 lb (60.328 kg)  BMI 21.47 kg/m2  SpO2 100%  Physical Exam  Nursing note and vitals reviewed. Constitutional: She appears well-developed and well-nourished.  HENT:  Head: Normocephalic and atraumatic.  Eyes: Conjunctivae normal are normal.  Neck: Normal range of motion. Thyromegaly present.       Generally enlarged right thyroid gland without palpable distinct nodules.  Cardiovascular: Normal rate, regular rhythm, normal heart sounds and intact distal pulses.   Pulmonary/Chest: Effort normal and breath sounds normal. She has no wheezes.  Abdominal: Soft. Bowel sounds are normal. There is no tenderness.  Musculoskeletal: Normal range of motion.  Neurological: She is alert.  Skin: Skin is warm and dry.  Psychiatric: She has a normal mood and affect.    ED Course  Procedures (including critical care time)   Labs Reviewed  CBC WITH DIFFERENTIAL  TSH  T4, FREE  T3   No results found.   1. Thyroid nodule    Labs obtained,  Including thyroid function tests. Pt was set up for an outpatient thyroid US - to return in 2 days for this study with results to be called to her pcp at [facility].   MDM  Prn f/u. Here.   Patients questions were answered.  She understands to call her pcp for further management of her condition.        Burgess Amor, PA 11/01/12 1726

## 2012-11-01 NOTE — ED Provider Notes (Signed)
Medical screening examination/treatment/procedure(s) were performed by non-physician practitioner and as supervising physician I was immediately available for consultation/collaboration.  Jayle Solarz R. Roderic Lammert, MD 11/01/12 2341 

## 2012-11-01 NOTE — ED Notes (Signed)
Pt c/o "knot" to right side of neck that she noticed a few days ago, denies any injury, cold or sinus symptoms, no sore throat, states that she does not have any problems with swallowing or eating. Just feels uncomfortable with her neck in the area of the "knot"

## 2012-11-02 LAB — T3: T3, Total: 104.4 ng/dl (ref 80.0–204.0)

## 2012-11-02 LAB — TSH: TSH: 0.373 u[IU]/mL (ref 0.350–4.500)

## 2012-11-03 ENCOUNTER — Ambulatory Visit (HOSPITAL_COMMUNITY)
Admit: 2012-11-03 | Discharge: 2012-11-03 | Disposition: A | Discharge status: Home / Self Care | Payer: Medicaid Other | Attending: Emergency Medicine | Admitting: Emergency Medicine

## 2012-11-03 DIAGNOSIS — E041 Nontoxic single thyroid nodule: Secondary | ICD-10-CM

## 2012-11-04 ENCOUNTER — Ambulatory Visit (INDEPENDENT_AMBULATORY_CARE_PROVIDER_SITE_OTHER): Payer: Medicaid Other | Admitting: Family Medicine

## 2012-11-04 ENCOUNTER — Encounter: Payer: Self-pay | Admitting: Family Medicine

## 2012-11-04 ENCOUNTER — Telehealth: Payer: Self-pay | Admitting: Family Medicine

## 2012-11-04 VITALS — BP 120/80 | HR 80 | Temp 98.3°F | Ht 66.0 in | Wt 136.0 lb

## 2012-11-04 DIAGNOSIS — E060 Acute thyroiditis: Secondary | ICD-10-CM

## 2012-11-04 DIAGNOSIS — E041 Nontoxic single thyroid nodule: Secondary | ICD-10-CM

## 2012-11-04 MED ORDER — IBUPROFEN 600 MG PO TABS: 600.0000 mg | ORAL_TABLET | Freq: Four times a day (QID) | ORAL | Status: DC | PRN

## 2012-11-04 NOTE — Telephone Encounter (Signed)
Called to discuss recent thyroid US in the ED. No answer and not available.

## 2012-11-04 NOTE — Progress Notes (Signed)
Subjective:    Patient ID: Lori Huerta, female    DOB: 02-26-1986, 26 y.o.   MRN: [id number]  HPI 26 y.o. female with tender nodule on right anterior neck.  Seen 11/2 in ED and had sono and labs. Mom noticed swelling in neck 3 days earlier. Pt states now the lump seems sore and tender - hurts to press on it. No problems swallowing or breathing. No fever or chills. Does have hx of panic attacks with SOB, diaphoresis, palpitations, chest pain. These have been going on for years and have not changed. She does complain of fatigue and constipation. No hair or skin changes. No history thyroid problems or recent viral illnesses.   Past Medical History  Diagnosis Date  . Kidney stones   . HYPERTENSION 12/07/2010    Qualifier: Diagnosis of  By: Alvester Morin MD, Viviann Spare    . Bipolar 1 disorder   . Anxiety    Past Surgical History  Procedure Date  . Cesarean section    History   Social History  . Marital Status: Single    Spouse Name: N/A    Number of Children: N/A  . Years of Education: N/A   Occupational History  . Not on file.   Social History Main Topics  . Smoking status: Never Smoker   . Smokeless tobacco: Not on file  . Alcohol Use: Yes     Comment: occasionally  . Drug Use: No  . Sexually Active: Yes    Birth Control/ Protection: IUD   Other Topics Concern  . Not on file   Social History Narrative  . No narrative on file   Fam Hx:   Unknown.  Review of Systems  Constitutional: Positive for fatigue. Negative for fever, chills, activity change, appetite change and unexpected weight change.  HENT: Negative for trouble swallowing and voice change.   Eyes: Negative for visual disturbance.  Cardiovascular: Negative for chest pain.  Gastrointestinal: Positive for constipation. Negative for diarrhea.  Neurological: Negative for dizziness, weakness and light-headedness.       Objective:   Physical Exam  Constitutional: She is oriented to person, place, and time. She appears  well-developed and well-nourished. No distress.  HENT:  Head: Normocephalic and atraumatic.  Eyes: Conjunctivae normal and EOM are normal.  Neck: Normal range of motion. Neck supple. No tracheal deviation present. No thyromegaly present.       Small nodule on right lobe of thyroid, tender.  Cardiovascular: Normal rate, regular rhythm and normal heart sounds.   Pulmonary/Chest: Effort normal and breath sounds normal. No respiratory distress.  Musculoskeletal: Normal range of motion. She exhibits no edema and no tenderness.  Lymphadenopathy:    She has no cervical adenopathy.  Neurological: She is alert and oriented to person, place, and time.  Skin: Skin is warm and dry.   Filed Vitals:   11/04/12 1032  BP: 120/80  Pulse: 80  Temp: 98.3 F (36.8 C)    Thyroid Ultrasound 11/01/12  Right thyroid lobe: 24 x 24 x 46 mm, inhomogeneous background  parenchyma  Left thyroid lobe: 17 x 18 x 39 mm  Isthmus: 3 mm in thickness  Focal nodules: 4 x 4x8 mm cyst, lateral mid-left  7 x 9 x 11 mm solid, mid-left  5 x 6 x 7 mm complex mostly solid, lateral mid right  Lymphadenopathy: None visualized.   IMPRESSION:  1. Small bilateral thyroid lesions. Findings do not meet current  consensus criteria for biopsy. Follow-up by clinical exam is  recommended. If patient has known risk factors for thyroid  carcinoma, consider follow-up ultrasound in 12 months. If patient  is clinically hyperthyroid, consider nuclear medicine thyroid  uptake and scan. This recommendation follows the consensus  statement: Management of Thyroid Nodules Detected as [place]: Society  of Radiologists in Ultrasound Consensus Conference Statement.  Radiology 2005; [id number].  TSH:  0.373 Free T4 1.18 T3 Total 104.4 Hgb/Hct:  14/39 WBC: 5.6    Assessment & Plan:  26 y.o. female with bilateral thyroid nodules and mild neck tenderness, normal thyroid function - Likely thyroiditis - Ibuprofen. If tenderness/soreness does  not resolve, consider steroids - Monitor thyroid function - Repeat scan in 12 months  Napoleon Form, MD

## 2012-11-04 NOTE — Assessment & Plan Note (Signed)
Possible thyroiditis. Ibuprofen BID or TID. If pain doesn't resolve, consider steroid course. Monitor thyroid function in 4-6 weeks or if symptoms change. Repeat scan in 12 months.

## 2012-11-04 NOTE — Patient Instructions (Addendum)
See handout on thyroiditis.  Take ibuprofen as needed for pain and inflammation.  Follow up in 4 weeks.

## 2012-11-11 ENCOUNTER — Ambulatory Visit: Payer: Medicaid Other | Admitting: Family Medicine

## 2012-12-01 ENCOUNTER — Ambulatory Visit (INDEPENDENT_AMBULATORY_CARE_PROVIDER_SITE_OTHER): Payer: Medicaid Other | Admitting: Family Medicine

## 2012-12-01 ENCOUNTER — Encounter: Payer: Self-pay | Admitting: Family Medicine

## 2012-12-01 VITALS — BP 112/80 | HR 93 | Ht 66.0 in | Wt 136.4 lb

## 2012-12-01 DIAGNOSIS — M549 Dorsalgia, unspecified: Secondary | ICD-10-CM | POA: Insufficient documentation

## 2012-12-01 DIAGNOSIS — E041 Nontoxic single thyroid nodule: Secondary | ICD-10-CM

## 2012-12-01 DIAGNOSIS — Z309 Encounter for contraceptive management, unspecified: Secondary | ICD-10-CM

## 2012-12-01 DIAGNOSIS — IMO0001 Reserved for inherently not codable concepts without codable children: Secondary | ICD-10-CM

## 2012-12-01 MED ORDER — MELOXICAM 15 MG PO TABS: 7.5000 mg | ORAL_TABLET | Freq: Every day | ORAL | Status: DC

## 2012-12-01 MED ORDER — MELOXICAM 15 MG PO TABS: 15.0000 mg | ORAL_TABLET | Freq: Every day | ORAL | Status: DC

## 2012-12-01 NOTE — Progress Notes (Signed)
  Subjective:    Patient ID: Lori Huerta, female    DOB: January 20, 1986, 26 y.o.   MRN: [id number]  HPI  Thyroid nodule Still tender to the touch, her toddler hits it often and she has to be careful how she sleeps. Ibuprofen helps but she is hesitant to take pills daily Some sweats, racing heart and diarrhea, however it is unchanged from baseline fro her as she has panic attacks and it all happened situationally this weekend.   Back pain Present for years, right sided mid back. 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  hasnt noticed if ibuprofen helps.  7/10 to 9/10 when worse. 7/10 today No pain with bending, no bowel/bladder incontinence, no falls or dis-coordination.  Pain with UTI's in the past is lower.   Review of Systems No fever, chills No chest pain No SOB No nausea or vomiting No dysuria + back pain as above + palpitations, sweats, diarrhea as above    Objective:   Physical Exam  Gen: NAD, alert, cooperative with exam HEENT: NCAT Neck: Thyroid soft and palpable, no nodules, non tender to the touch CV: RRR, no murmur, good S1/S2 Resp: CTABL, no wheezes, non-labored Ext: No edema Neuro: Alert and oriented, No gross deficits MSK: back spine non tender, Paraspinous tenderness on R side in mid thoracic region, no objective wincing with palpation but reported pain with palp     Assessment & Plan:

## 2012-12-01 NOTE — Assessment & Plan Note (Signed)
Has mirena for last 2 years and happy with it. Would like BTL before her medicaid runs out as she is unsure of what her insurance status will be in 3 years when it needs to come out.

## 2012-12-01 NOTE — Assessment & Plan Note (Signed)
Chronic, musculoskeletal, likely from from strain.  No red flags, no concern for infection or neurologic involvement  Mobic 15 mg daily for 1 week, then PRN Flexeril as prn at night Advised on proper lifting and not to take other NSAIDs with mobic

## 2012-12-01 NOTE — Patient Instructions (Signed)
Thanks for coming in today!  Come back to see [place] if your thyroid is still tender in 1 month, I believe it will slowly begin to hurt less.   I have given you a longer acting medicine, Mobic, take one a day for 1 week and then take it once daily as you need it.  Do not take ibuprofen with mobic You can take tylenol along with it if you need to.   I have made a referral for you to an OB/Gyn to get the tubal you'd like.

## 2012-12-01 NOTE — Assessment & Plan Note (Signed)
Unchanged, still tender to touch per Hx, helped by NSAIDs.   Likely viral thyroiditis Soft and no nodules palpated on exam.  Small BL nodules not large enough for FNA  TSH, T3, FT4 all WNL   Mobic daily for 1 week- 15 mg, then PRN F/u in 4 weeks if not resolved.  Repeat labs at that time if not resolved, also steroids if not resolved.

## 2012-12-02 ENCOUNTER — Telehealth: Payer: Self-pay | Admitting: Family Medicine

## 2012-12-02 NOTE — Telephone Encounter (Signed)
Wants to know when she will be scheduled for the tubal

## 2012-12-02 NOTE — Telephone Encounter (Signed)
Called pt. LMVM to call back. Please tell pt, that [facility] will call her to schedule the appt for her tubal ligation.  Lorenda Hatchet, Renato Battles

## 2012-12-03 ENCOUNTER — Encounter: Payer: Self-pay | Admitting: Advanced Practice Midwife

## 2012-12-19 ENCOUNTER — Encounter: Payer: Medicaid Other | Admitting: Advanced Practice Midwife

## 2012-12-25 ENCOUNTER — Emergency Department (HOSPITAL_COMMUNITY)
Admission: EM | Admit: 2012-12-25 | Discharge: 2012-12-26 | Disposition: A | Discharge status: Home / Self Care | Payer: Medicaid Other | Attending: Emergency Medicine | Admitting: Emergency Medicine

## 2012-12-25 ENCOUNTER — Encounter: Payer: Medicaid Other | Admitting: Family Medicine

## 2012-12-25 ENCOUNTER — Encounter (HOSPITAL_COMMUNITY): Payer: Self-pay | Admitting: Emergency Medicine

## 2012-12-25 DIAGNOSIS — Z3202 Encounter for pregnancy test, result negative: Secondary | ICD-10-CM | POA: Insufficient documentation

## 2012-12-25 DIAGNOSIS — Z87440 Personal history of urinary (tract) infections: Secondary | ICD-10-CM | POA: Insufficient documentation

## 2012-12-25 DIAGNOSIS — R350 Frequency of micturition: Secondary | ICD-10-CM | POA: Insufficient documentation

## 2012-12-25 DIAGNOSIS — Z791 Long term (current) use of non-steroidal anti-inflammatories (NSAID): Secondary | ICD-10-CM | POA: Insufficient documentation

## 2012-12-25 DIAGNOSIS — Z87442 Personal history of urinary calculi: Secondary | ICD-10-CM | POA: Insufficient documentation

## 2012-12-25 DIAGNOSIS — R109 Unspecified abdominal pain: Secondary | ICD-10-CM | POA: Insufficient documentation

## 2012-12-25 DIAGNOSIS — N390 Urinary tract infection, site not specified: Secondary | ICD-10-CM | POA: Insufficient documentation

## 2012-12-25 DIAGNOSIS — F309 Manic episode, unspecified: Secondary | ICD-10-CM | POA: Insufficient documentation

## 2012-12-25 DIAGNOSIS — I10 Essential (primary) hypertension: Secondary | ICD-10-CM | POA: Insufficient documentation

## 2012-12-25 DIAGNOSIS — Z87448 Personal history of other diseases of urinary system: Secondary | ICD-10-CM | POA: Insufficient documentation

## 2012-12-25 DIAGNOSIS — Z8659 Personal history of other mental and behavioral disorders: Secondary | ICD-10-CM | POA: Insufficient documentation

## 2012-12-25 DIAGNOSIS — Z79899 Other long term (current) drug therapy: Secondary | ICD-10-CM | POA: Insufficient documentation

## 2012-12-25 LAB — URINE MICROSCOPIC-ADD ON

## 2012-12-25 LAB — URINALYSIS, ROUTINE W REFLEX MICROSCOPIC
Bilirubin Urine: NEGATIVE
Glucose, UA: NEGATIVE mg/dL
Ketones, ur: NEGATIVE mg/dL
Protein, ur: 30 mg/dL — AB
pH: 6.5 (ref 5.0–8.0)

## 2012-12-25 MED ORDER — CEFTRIAXONE SODIUM 1 G IJ SOLR
1.0000 g | Freq: Once | INTRAMUSCULAR | Status: AC
  Administered 2012-12-26: 1 g via INTRAMUSCULAR
  Filled 2012-12-25: qty 10

## 2012-12-25 NOTE — ED Notes (Signed)
Patient complaining of "pinching" pain to urethra and dysuria. Also report left flank pain x 1 week.

## 2012-12-25 NOTE — ED Provider Notes (Signed)
History     CSN: [id number]  Arrival date & time 12/25/12  2219   First MD Initiated Contact with Patient 12/25/12 2309      Chief Complaint  Patient presents with  . Dysuria  . Flank Pain    (Consider location/radiation/quality/duration/timing/severity/associated sxs/prior treatment) HPI Comments: Patient c/o left flank pain intermittently for one week and urinary symptoms for 3 days.  Describes a burning and "pinching" sensation to her vaginal area when she urinates.  Pain is worse post void.  States the pain feels similar to previous UTI's.  She began taking OTC AZO several days ago with improvment of he symptoms. Noticed blood in her urine tonight when she voided here to give a urine specimen, but denies previous hematuria.   She also denies vaginal discharge, fever, abdominal pain, or vomiting  Patient is a 26 y.o. female presenting with dysuria. The history is provided by the patient.  Dysuria  This is a new problem. The current episode started more than 2 days ago. The problem occurs every urination. The problem has not changed since onset.The quality of the pain is described as burning. The pain is moderate. There has been no fever. She is sexually active. There is a history of pyelonephritis. Associated symptoms include frequency and flank pain. Pertinent negatives include no chills, no sweats, no nausea, no vomiting, no discharge, no hematuria, no hesitancy, no possible pregnancy and no urgency. She has tried nothing for the symptoms. Her past medical history is significant for kidney stones and recurrent UTIs. Her past medical history does not include catheterization.    Past Medical History  Diagnosis Date  . Kidney stones   . HYPERTENSION 12/07/2010    Qualifier: Diagnosis of  By: Alvester Morin MD, Viviann Spare    . Bipolar 1 disorder   . Anxiety     Past Surgical History  Procedure Date  . Cesarean section     History reviewed. No pertinent family history.  History  Substance  Use Topics  . Smoking status: Never Smoker   . Smokeless tobacco: Not on file  . Alcohol Use: Yes     Comment: occasionally    OB History    Grav Para Term Preterm Abortions TAB SAB Ect Mult Living                  Review of Systems  Constitutional: Negative for fever, chills, activity change and appetite change.  Respiratory: Negative for chest tightness and shortness of breath.   Cardiovascular: Negative for chest pain.  Gastrointestinal: Negative for nausea, vomiting and abdominal pain.  Genitourinary: Positive for dysuria, frequency and flank pain. Negative for hesitancy, urgency, hematuria, decreased urine volume, vaginal bleeding, vaginal discharge, difficulty urinating and menstrual problem.  Skin: Negative for rash.  Hematological: Negative for adenopathy.  All other systems reviewed and are negative.    Allergies  Review of patient's allergies indicates no known allergies.  Home Medications   Current Outpatient Rx  Name  Route  Sig  Dispense  Refill  . IBUPROFEN 600 MG PO TABS   Oral   Take 1 tablet (600 mg total) by mouth every 6 (six) hours as needed for pain.   30 tablet   1   . LORAZEPAM 0.5 MG PO TABS   Oral   Take 1 tablet (0.5 mg total) by mouth daily as needed. Anxiety   30 tablet   0   . LURASIDONE HCL 20 MG PO TABS   Oral   Take 1  tablet (20 mg total) by mouth once. In the evening with a meal.   30 tablet   1     Per Dr. Kathrynn Running in [facility].   . MELOXICAM 15 MG PO TABS   Oral   Take 1 tablet (15 mg total) by mouth daily.   30 tablet   0     BP 112/90  Pulse 86  Temp 98 F (36.7 C) (Oral)  Resp 14  Ht 5\' 6"  (1.676 m)  Wt 133 lb (60.328 kg)  BMI 21.47 kg/m2  SpO2 100%  Physical Exam  Nursing note and vitals reviewed. Constitutional: She is oriented to person, place, and time. She appears well-developed and well-nourished. No distress.  HENT:  Head: Normocephalic and atraumatic.  Mouth/Throat: Oropharynx is clear and moist.  Eyes:  EOM are normal. Pupils are equal, round, and reactive to light.  Neck: Normal range of motion. Neck supple.  Cardiovascular: Normal rate, regular rhythm, normal heart sounds and intact distal pulses.   No murmur heard. Pulmonary/Chest: Effort normal and breath sounds normal. No respiratory distress.  Abdominal: Soft. Bowel sounds are normal. She exhibits no distension and no mass. There is no tenderness. There is no rebound and no guarding.  Genitourinary:       Slight ttp of the left flank.    Musculoskeletal: Normal range of motion. She exhibits no edema.  Neurological: She is alert and oriented to person, place, and time. She exhibits normal muscle tone. Coordination normal.  Skin: Skin is warm and dry.    ED Course  Procedures (including critical care time)  Results for orders placed during the hospital encounter of 12/25/12  URINALYSIS, ROUTINE W REFLEX MICROSCOPIC      Component Value Range   Color, Urine YELLOW  YELLOW   APPearance HAZY (*) CLEAR   Specific Gravity, Urine 1.020  1.005 - 1.030   pH 6.5  5.0 - 8.0   Glucose, UA NEGATIVE  NEGATIVE mg/dL   Hgb urine dipstick LARGE (*) NEGATIVE   Bilirubin Urine NEGATIVE  NEGATIVE   Ketones, ur NEGATIVE  NEGATIVE mg/dL   Protein, ur 30 (*) NEGATIVE mg/dL   Urobilinogen, UA 2.0 (*) 0.0 - 1.0 mg/dL   Nitrite POSITIVE (*) NEGATIVE   Leukocytes, UA MODERATE (*) NEGATIVE  PREGNANCY, URINE      Component Value Range   Preg Test, Ur NEGATIVE  NEGATIVE  URINE MICROSCOPIC-ADD ON      Component Value Range   Squamous Epithelial / LPF FEW (*) RARE   WBC, UA TOO NUMEROUS TO COUNT  <3 WBC/hpf   RBC / HPF 11-20  <3 RBC/hpf   Bacteria, UA MANY (*) RARE     Ct Abdomen Pelvis Wo Contrast  12/26/2012  *RADIOLOGY REPORT*  Clinical Data: 26 year old female with left flank, abdominal and pelvic pain with dysuria.  CT ABDOMEN AND PELVIS WITHOUT CONTRAST  Technique:  Multidetector CT imaging of the abdomen and pelvis was performed following the  standard protocol without intravenous contrast.  Comparison: 06/02/2012 and prior CTs  Findings: The liver, gallbladder, spleen, adrenal glands, pancreas and kidneys are unremarkable. Please note that parenchymal abnormalities may be missed as intravenous contrast was not administered. No free fluid, enlarged lymph nodes, biliary dilation or abdominal aortic aneurysm identified. The bowel and bladder are unremarkable.  An IUD is unchanged but appears tilted the uterus.  No acute or suspicious bony abnormalities are identified.  IMPRESSION: No evidence of acute abnormality - no evidence of urinary calculi.  Unchanged  IUD but appears tilted within the uterus.  Consider further evaluation is indicated.   Original Report Authenticated By: Harmon Pier, M.D.     Urine culture is pending.   MDM    Previous ED charts reviewed.  Seen here 06/13 for similar sx's.  Treated for UTI , urine culture at that time grew E. Coli with sensitivity to most medications.      Has mild left flank tenderness (which is recurrent) without vomiting or fever.  Treated tonight with IM Rocephin and will prescribe Keflex for 7 days.  CT results discussed.  Pt agrees to increase fluids and close f/u with her PMD     Dala Breault L. Arnita Koons, PA 12/26/12 0045  Kevaughn Ewing L. [place], [place] 12/26/12 [phone]

## 2012-12-26 ENCOUNTER — Emergency Department (HOSPITAL_COMMUNITY): Payer: Medicaid Other

## 2012-12-26 MED ORDER — CEPHALEXIN 500 MG PO CAPS: 500.0000 mg | ORAL_CAPSULE | Freq: Four times a day (QID) | ORAL | Status: DC

## 2012-12-26 MED ORDER — PHENAZOPYRIDINE HCL 200 MG PO TABS: 200.0000 mg | ORAL_TABLET | Freq: Three times a day (TID) | ORAL | Status: DC

## 2012-12-26 MED ORDER — LIDOCAINE HCL (PF) 1 % IJ SOLN
INTRAMUSCULAR | Status: AC
  Administered 2012-12-26: 2.1 mL
  Filled 2012-12-26: qty 5

## 2012-12-26 NOTE — ED Provider Notes (Signed)
Medical screening examination/treatment/procedure(s) were performed by non-physician practitioner and as supervising physician I was immediately available for consultation/collaboration.  Nicoletta Dress. Colon Branch, MD 12/26/12 [phone]

## 2012-12-27 LAB — URINE CULTURE

## 2012-12-28 NOTE — ED Notes (Signed)
+  Urine. +MRSA. Patient given Keflex. No sensitivity listed. Chart sent to EDP office for review.

## 2012-12-31 NOTE — ED Notes (Signed)
Chart returned from EDP office . Rx for Bactrim DS Sig: One tablet po BID x 5 days  # 10 per Pascal Lux Wingen PA-C

## 2013-01-01 ENCOUNTER — Telehealth: Payer: Self-pay | Admitting: Family Medicine

## 2013-01-01 NOTE — Telephone Encounter (Signed)
Patient is upset and confused about her lab results.  She would like to speak to an RN or MD as soon as possible.

## 2013-01-01 NOTE — Telephone Encounter (Signed)
Attempted to call multiple times.  1st time it sounded as someone picked up the phone but said nothing.  Then it was a busy signal the next time I called.  Will await callback. Tesla Keeler, Maryjo Rochester

## 2013-01-02 ENCOUNTER — Telehealth: Payer: Self-pay | Admitting: Family Medicine

## 2013-01-02 MED ORDER — SULFAMETHOXAZOLE-TRIMETHOPRIM 800-160 MG PO TABS: 1.0000 | ORAL_TABLET | Freq: Two times a day (BID) | ORAL | Status: DC

## 2013-01-02 NOTE — Telephone Encounter (Signed)
Called and left message that she needs a different antibiotic and that I ordered it and sent it to her pharmacy. I instructed her to take it as directed until I see her on Tuesday. She has MRSA UTI and so needs to be treated adequately which Keflex is not doing. Asked Jone Baseman to attempt another call and advise about new med and to seek help if she developes signs of pyelo- fever, chills, severe back pain.

## 2013-01-02 NOTE — Telephone Encounter (Signed)
Attempted to call x 2, no answer.  Lori Huerta, Lori Huerta

## 2013-01-02 NOTE — Telephone Encounter (Signed)
Hi Lori Huerta,   She has MRSA in her urine from the recent lab-work. I tried to call her, but had to leave a message that I sent her a Rx. Will you give her another try to tell her to take the new meds and seek medical attention if she develops fevers, chills, or severe back pain? Thanks so much for your help.

## 2013-01-02 NOTE — Telephone Encounter (Signed)
Pt wanted to know if this affected her husband in any way.  Consulted with Dr. Mikel Cella and informed pt that it does not.  Then asked is she was taking the pyridium as well as keflex.  States that she is not longer taking the keflex but does have some left.  Per Dr. Mikel Cella to resume keflex and continue pyridium and come to see Dr. Ermalinda Memos for a f/u urine test.  appt made for Tuesday with bradshaw. Lori Huerta, Lori Huerta

## 2013-01-06 ENCOUNTER — Ambulatory Visit (INDEPENDENT_AMBULATORY_CARE_PROVIDER_SITE_OTHER): Payer: Medicaid Other | Admitting: Family Medicine

## 2013-01-06 VITALS — BP 130/83 | HR 84 | Ht 66.0 in | Wt 138.0 lb

## 2013-01-06 DIAGNOSIS — R300 Dysuria: Secondary | ICD-10-CM

## 2013-01-06 DIAGNOSIS — R21 Rash and other nonspecific skin eruption: Secondary | ICD-10-CM

## 2013-01-06 DIAGNOSIS — N390 Urinary tract infection, site not specified: Secondary | ICD-10-CM | POA: Insufficient documentation

## 2013-01-06 NOTE — Patient Instructions (Signed)
I've ordered the urine analysis and culture in our computer and you should be able to just show up and have it done.   If you develop pain with urination, fever, chills, sweats or weakness call us and let us know.  If you feel like you need immediate medical help go to the emergency department.

## 2013-01-06 NOTE — Assessment & Plan Note (Signed)
Not visibly active at this time.  Hypopigmented, no abscess, warmth, or erythema Likely not related to MRSA UTI.  WIll monitor and advised patient to come back if it flares again and begins to bother her.

## 2013-01-06 NOTE — Assessment & Plan Note (Addendum)
Recently seen in teh ED for a UTI and given an RX for keflex, UCx grew MRSA so I called in bactrim Ds  She has completed 5 days of Bactrim DS BID and her symptoms have resolved.  Future UA and UCx that she will collect on Friday at [facility] in [place] as that's where she lives.  Will plan to treat more aggressively if she has not cleared the infection at that time.  I recognize that MRSA in UTI may be from endogenous seeding. This does seem unlikely however in this non toxic healthy appearing young lady.  I don't find any evidence of systemic infection and dont believe her peri-anal rash is related. I warned her of the signs of that and UTI with inst to call if either show up.

## 2013-01-06 NOTE — Progress Notes (Signed)
  Subjective:    Patient ID: Lori Huerta, female    DOB: [birth year], 27 y.o.   MRN: [id number]  HPI Patient here to f/u for recent UTI that grew MRSA taht she was seen in the ED for.   She did get her bactrim Rx and took all 5 days worth. Some discomfort with urination but dysuria that she was experiencing before has relieved.   No current polyuria, or frrling of urgency, + chronic back pain, no suprapubic pain No fevers, chills, sweats, dyspnea, painful joints, chest pain, sore throat  Does have a rash that is peri-anal that itches and comes and goes lasting approx 1 week when it does, occasionally it bleeds and this has been going on for months. She uses neosporin on it and it resolves easily.   No Hx of MRSA infections.    Review of Systems Per HPI    Objective:   Physical Exam  Gen: NAD, alert, cooperative with exam CV: RRR, good S1/S2, no murmur Resp: CTABL, no wheezes, non-labored Anus: small very lightly hypopigmented area less than 1 cm with irregular border at 9 oclock. No abscess, warmth, induration, tenderness, or erythema.      Assessment & Plan:

## 2013-01-11 ENCOUNTER — Telehealth: Payer: Self-pay | Admitting: Family Medicine

## 2013-01-11 ENCOUNTER — Encounter (HOSPITAL_COMMUNITY): Payer: Self-pay | Admitting: *Deleted

## 2013-01-11 ENCOUNTER — Emergency Department (HOSPITAL_COMMUNITY)
Admission: EM | Admit: 2013-01-11 | Discharge: 2013-01-11 | Disposition: A | Discharge status: Home / Self Care | Payer: Medicaid Other | Attending: Emergency Medicine | Admitting: Emergency Medicine

## 2013-01-11 DIAGNOSIS — F319 Bipolar disorder, unspecified: Secondary | ICD-10-CM | POA: Insufficient documentation

## 2013-01-11 DIAGNOSIS — I10 Essential (primary) hypertension: Secondary | ICD-10-CM | POA: Insufficient documentation

## 2013-01-11 DIAGNOSIS — Z87442 Personal history of urinary calculi: Secondary | ICD-10-CM | POA: Insufficient documentation

## 2013-01-11 DIAGNOSIS — F411 Generalized anxiety disorder: Secondary | ICD-10-CM | POA: Insufficient documentation

## 2013-01-11 DIAGNOSIS — R300 Dysuria: Secondary | ICD-10-CM | POA: Insufficient documentation

## 2013-01-11 DIAGNOSIS — Z79899 Other long term (current) drug therapy: Secondary | ICD-10-CM | POA: Insufficient documentation

## 2013-01-11 DIAGNOSIS — Z3202 Encounter for pregnancy test, result negative: Secondary | ICD-10-CM | POA: Insufficient documentation

## 2013-01-11 LAB — URINALYSIS, ROUTINE W REFLEX MICROSCOPIC
Glucose, UA: NEGATIVE mg/dL
Specific Gravity, Urine: >1.03 — ABNORMAL HIGH (ref 1.005–1.030)
pH: 6 (ref 5.0–8.0)

## 2013-01-11 LAB — POCT PREGNANCY, URINE: Preg Test, Ur: NEGATIVE

## 2013-01-11 LAB — URINE MICROSCOPIC-ADD ON

## 2013-01-11 MED ORDER — ONDANSETRON 8 MG PO TBDP
8.0000 mg | ORAL_TABLET | Freq: Once | ORAL | Status: AC
  Administered 2013-01-11: 8 mg via ORAL
  Filled 2013-01-11: qty 1

## 2013-01-11 MED ORDER — HYDROCODONE-ACETAMINOPHEN 5-325 MG PO TABS: 1.0000 | ORAL_TABLET | ORAL | Status: AC | PRN

## 2013-01-11 MED ORDER — PHENAZOPYRIDINE HCL 200 MG PO TABS: 200.0000 mg | ORAL_TABLET | Freq: Three times a day (TID) | ORAL | Status: DC

## 2013-01-11 MED ORDER — PHENAZOPYRIDINE HCL 100 MG PO TABS
200.0000 mg | ORAL_TABLET | Freq: Once | ORAL | Status: AC
  Administered 2013-01-11: 200 mg via ORAL
  Filled 2013-01-11: qty 2

## 2013-01-11 NOTE — Telephone Encounter (Signed)
Patient calling to see if results of urinalysis done at [facility] on 01/09/13 are available. The test was performed as a test of cure for MRSA UTI diagnosed 12/25/12. She completed her course of antibiotics. However, she is having the same pinching sensation in her bladder and urethra today that she had at the time of her diagnosis with the MRSA UTI. Unfortunately, I do not see any lab results for the patient from [facility] from 01/09/13. I advised her to return there for evaluation today if the symptoms were very bothersome. She is in agreement with that plan.

## 2013-01-11 NOTE — ED Notes (Addendum)
Pt recently tx for uti, states that she had gotten better but the "pinching" feeling return to her bladder last night, burning with urination, pain if worse after she finishes urination. Pt also reports that she has had "sneezing" over the past few days,

## 2013-01-12 ENCOUNTER — Telehealth: Payer: Self-pay | Admitting: Family Medicine

## 2013-01-12 NOTE — Telephone Encounter (Signed)
Pt is asking for her results from last week

## 2013-01-13 LAB — URINE CULTURE: Colony Count: 4000

## 2013-01-13 NOTE — Telephone Encounter (Signed)
Forward to PCP to review results

## 2013-01-13 NOTE — Telephone Encounter (Signed)
Called pt to discuss recent urine culture results. Previously had MRSA UTI, now repeat UCx shows no growth. She didn't have any further questions.

## 2013-01-14 ENCOUNTER — Ambulatory Visit (INDEPENDENT_AMBULATORY_CARE_PROVIDER_SITE_OTHER): Payer: Medicaid Other | Admitting: Obstetrics & Gynecology

## 2013-01-14 ENCOUNTER — Encounter: Payer: Self-pay | Admitting: Obstetrics & Gynecology

## 2013-01-14 ENCOUNTER — Other Ambulatory Visit (HOSPITAL_COMMUNITY)
Admission: RE | Admit: 2013-01-14 | Discharge: 2013-01-14 | Disposition: A | Discharge status: Home / Self Care | Payer: Medicaid Other | Source: Ambulatory Visit | Attending: Obstetrics & Gynecology | Admitting: Obstetrics & Gynecology

## 2013-01-14 VITALS — BP 124/85 | HR 84 | Temp 98.0°F | Ht 66.0 in | Wt 137.4 lb

## 2013-01-14 DIAGNOSIS — Z1151 Encounter for screening for human papillomavirus (HPV): Secondary | ICD-10-CM | POA: Insufficient documentation

## 2013-01-14 DIAGNOSIS — Z23 Encounter for immunization: Secondary | ICD-10-CM

## 2013-01-14 DIAGNOSIS — Z113 Encounter for screening for infections with a predominantly sexual mode of transmission: Secondary | ICD-10-CM | POA: Insufficient documentation

## 2013-01-14 DIAGNOSIS — Z01419 Encounter for gynecological examination (general) (routine) without abnormal findings: Secondary | ICD-10-CM

## 2013-01-14 DIAGNOSIS — Z309 Encounter for contraceptive management, unspecified: Secondary | ICD-10-CM

## 2013-01-14 DIAGNOSIS — IMO0001 Reserved for inherently not codable concepts without codable children: Secondary | ICD-10-CM

## 2013-01-14 DIAGNOSIS — Z0000 Encounter for general adult medical examination without abnormal findings: Secondary | ICD-10-CM

## 2013-01-14 NOTE — Progress Notes (Signed)
  Subjective:    Patient ID: Lori Huerta, female    DOB: 12/26/1986, 27 y.o.   MRN: [id number]  HPI  27 yo M AA G3P2 A1 who wants permanent sterility. Her husband refuses to have a vasectomy. She had signed her MCD forms prior to her emergency C/S 27 years ago but her regular doctor didn't perform the c/s and so the PPS was not done at that time. She has re signed the MCD forms today. She is certain that she does not want another pregancy and declines alternative forms of birth control (She has tried Mirena, depo, patch, and OCPs). We discussed the new cancer recommendations of removing the entire tube and she understands that this is NOT reversible.    Review of Systems     Objective:   Physical Exam  NSSA, NT mobile, normal adnexal exam      Assessment & Plan:   Preventative- pap smear today She declines a flu vaccine Gardasil series to start today

## 2013-01-14 NOTE — ED Provider Notes (Signed)
History     CSN: [id number]  Arrival date & time 01/11/13  1246   First MD Initiated Contact with Patient 01/11/13 1435      Chief Complaint  Patient presents with  . Urinary Tract Infection    (Consider location/radiation/quality/duration/timing/severity/associated sxs/prior treatment) HPI Comments: Lori Huerta is a 27 y.o. Female presenting with another episode of a pinching sensation in her bladder which started last night.  She does have burning with urination which is worsened toward the end of her urine stream.  She was treated for a uti last week and completed a 5 day course of bactrim after keflex was not helpful (bactrim sensitive per culture),  And her symptoms were improved until last night.  She denies vaginal discharge and dyspareunia.  Her symptoms have been intermittent since having an IUD placed.  She is scheduled to see a gynecologist in 3 days in anticipation of having the IUD removed and for tubal ligation. Pertinent negatives in no fevers, nausea, vomiting and abdominal pain.     The history is provided by the patient.    Past Medical History  Diagnosis Date  . Kidney stones   . HYPERTENSION 12/07/2010    Qualifier: Diagnosis of  By: Alvester Morin MD, Viviann Spare    . Bipolar 1 disorder   . Anxiety     Past Surgical History  Procedure Date  . Cesarean section     No family history on file.  History  Substance Use Topics  . Smoking status: Never Smoker   . Smokeless tobacco: Not on file  . Alcohol Use: Yes     Comment: occasionally    OB History    Grav Para Term Preterm Abortions TAB SAB Ect Mult Living                  Review of Systems  Constitutional: Negative for fever.  HENT: Negative for congestion, sore throat and neck pain.   Eyes: Negative.   Respiratory: Negative for chest tightness and shortness of breath.   Cardiovascular: Negative for chest pain.  Gastrointestinal: Negative for nausea and abdominal pain.  Genitourinary: Positive for  dysuria. Negative for frequency, vaginal discharge, vaginal pain and dyspareunia.  Musculoskeletal: Negative for joint swelling and arthralgias.  Skin: Negative.  Negative for rash and wound.  Neurological: Negative for dizziness, weakness, light-headedness, numbness and headaches.  Hematological: Negative.   Psychiatric/Behavioral: Negative.     Allergies  Review of patient's allergies indicates no known allergies.  Home Medications   Current Outpatient Rx  Name  Route  Sig  Dispense  Refill  . LORAZEPAM 0.5 MG PO TABS   Oral   Take 1 tablet (0.5 mg total) by mouth daily as needed. Anxiety   30 tablet   0   . LURASIDONE HCL 20 MG PO TABS   Oral   Take 1 tablet (20 mg total) by mouth once. In the evening with a meal.   30 tablet   1     Per Dr. Kathrynn Running in [facility].   Marland Kitchen HYDROCODONE-ACETAMINOPHEN 5-325 MG PO TABS   Oral   Take 1 tablet by mouth every 4 (four) hours as needed for pain.   20 tablet   0   . PHENAZOPYRIDINE HCL 200 MG PO TABS   Oral   Take 1 tablet (200 mg total) by mouth 3 (three) times daily.   6 tablet   0     BP 103/68  Pulse 91  Temp 98.5 F (36.9  C)  Resp 18  Ht 5\' 6"  (1.676 m)  Wt 138 lb (62.596 kg)  BMI 22.27 kg/m2  SpO2 100%  Physical Exam  Nursing note and vitals reviewed. Constitutional: She appears well-developed and well-nourished.  HENT:  Head: Normocephalic and atraumatic.  Eyes: Conjunctivae normal are normal.  Neck: Normal range of motion.  Cardiovascular: Normal rate, regular rhythm, normal heart sounds and intact distal pulses.   Pulmonary/Chest: Effort normal and breath sounds normal. She has no wheezes.  Abdominal: Soft. Bowel sounds are normal. There is no tenderness. There is no rebound.       No flank pain  Musculoskeletal: Normal range of motion.  Neurological: She is alert.  Skin: Skin is warm and dry.  Psychiatric: She has a normal mood and affect.    ED Course  Procedures (including critical care time)  Labs  Reviewed  URINALYSIS, ROUTINE W REFLEX MICROSCOPIC - Abnormal; Notable for the following:    Specific Gravity, Urine >1.030 (*)     Hgb urine dipstick TRACE (*)     All other components within normal limits  URINE MICROSCOPIC-ADD ON - Abnormal; Notable for the following:    Squamous Epithelial / LPF MANY (*)     Bacteria, UA FEW (*)     All other components within normal limits  POCT PREGNANCY, URINE  URINE CULTURE  LAB REPORT - SCANNED   No results found.   1. Dysuria     Urine culture sent.  Not clean catch urine, with many epithelial cells.   MDM  Encouraged f/u with gyn as planned.  Pyridium prescribed,  Hydrocodone prn increased pain.        Burgess Amor, [place] 01/14/13 2213

## 2013-01-15 NOTE — ED Provider Notes (Signed)
Medical screening examination/treatment/procedure(s) were performed by non-physician practitioner and as supervising physician I was immediately available for consultation/collaboration.  Donnetta Hutching, MD 01/15/13 [phone]

## 2013-01-28 ENCOUNTER — Encounter (HOSPITAL_COMMUNITY): Payer: Self-pay | Admitting: Pharmacist

## 2013-01-30 ENCOUNTER — Encounter (HOSPITAL_COMMUNITY): Payer: Self-pay

## 2013-01-30 ENCOUNTER — Other Ambulatory Visit: Payer: Self-pay | Admitting: Family Medicine

## 2013-01-30 ENCOUNTER — Encounter (HOSPITAL_COMMUNITY)
Admission: RE | Admit: 2013-01-30 | Discharge: 2013-01-30 | Disposition: A | Discharge status: Home / Self Care | Payer: Medicaid Other | Source: Ambulatory Visit | Attending: Obstetrics & Gynecology | Admitting: Obstetrics & Gynecology

## 2013-01-30 LAB — CBC
MCH: 32.1 pg (ref 26.0–34.0)
MCHC: 35.3 g/dL (ref 30.0–36.0)
Platelets: 306 10*3/uL (ref 150–400)
RDW: 12.2 % (ref 11.5–15.5)

## 2013-01-30 LAB — SURGICAL PCR SCREEN: MRSA, PCR: POSITIVE — AB

## 2013-01-30 NOTE — Patient Instructions (Addendum)
20 Lori Huerta  01/30/2013   Your procedure is scheduled on:  02/04/13  Enter through the Main Entrance of [facility] at 1130 AM.  Pick up the phone at the desk and dial [phone].   Call this number if you have problems the morning of surgery: [phone]   Remember:   Do not eat food:After Midnight.  Do not drink clear liquids: 4 Hours before arrival.  Take these medicines the morning of surgery with A SIP OF WATER: NA    Do not wear jewelry, make-up or nail polish.  Do not wear lotions, powders, or perfumes. You may wear deodorant.  Do not shave 48 hours prior to surgery.  Do not bring valuables to the hospital.  Contacts, dentures or bridgework may not be worn into surgery.  Leave suitcase in the car. After surgery it may be brought to your room.  For patients admitted to the hospital, checkout time is 11:00 AM the day of discharge.   Patients discharged the day of surgery will not be allowed to drive home.  Name and phone number of your driver: undecided  Special Instructions: Shower using CHG 2 nights before surgery and the night before surgery.  If you shower the day of surgery use CHG.  Use special wash - you have one bottle of CHG for all showers.  You should use approximately 1/3 of the bottle for each shower.   Please read over the following fact sheets that you were given: MRSA Information      20 Lori Huerta  01/30/2013   Your procedure is scheduled on:  02/04/13  Enter through the Main Entrance of [facility] at 1:30PM AM.  Pick up the phone at the desk and dial [phone].   Call this number if you have problems the morning of surgery: [phone]   Remember:   Do not eat food:After Midnight.  Do not drink clear liquids: 4 Hours before arrival.  Take these medicines the morning of surgery with A SIP OF WATER: NA   Do not wear jewelry, make-up or nail polish.  Do not wear lotions, powders, or perfumes. You may wear deodorant.  Do not shave 48 hours prior to  surgery.  Do not bring valuables to the hospital.  Contacts, dentures or bridgework may not be worn into surgery.  Leave suitcase in the car. After surgery it may be brought to your room.  For patients admitted to the hospital, checkout time is 11:00 AM the day of discharge.   Patients discharged the day of surgery will not be allowed to drive home.  Name and phone number of your driver: undecided  Special Instructions: Shower using CHG 2 nights before surgery and the night before surgery.  If you shower the day of surgery use CHG.  Use special wash - you have one bottle of CHG for all showers.  You should use approximately 1/3 of the bottle for each shower.   Please read over the following fact sheets that you were given: MRSA Information

## 2013-02-04 ENCOUNTER — Ambulatory Visit (HOSPITAL_COMMUNITY): Payer: Medicaid Other | Admitting: Anesthesiology

## 2013-02-04 ENCOUNTER — Ambulatory Visit (HOSPITAL_COMMUNITY)
Admission: RE | Admit: 2013-02-04 | Discharge: 2013-02-04 | Disposition: A | Discharge status: Home / Self Care | Payer: Medicaid Other | Source: Ambulatory Visit | Attending: Obstetrics & Gynecology | Admitting: Obstetrics & Gynecology

## 2013-02-04 ENCOUNTER — Encounter (HOSPITAL_COMMUNITY)
Admission: RE | Disposition: A | Discharge status: Home / Self Care | Payer: Self-pay | Source: Ambulatory Visit | Attending: Obstetrics & Gynecology

## 2013-02-04 ENCOUNTER — Encounter (HOSPITAL_COMMUNITY): Payer: Self-pay | Admitting: Anesthesiology

## 2013-02-04 DIAGNOSIS — Z30432 Encounter for removal of intrauterine contraceptive device: Secondary | ICD-10-CM

## 2013-02-04 DIAGNOSIS — Z01818 Encounter for other preprocedural examination: Secondary | ICD-10-CM

## 2013-02-04 DIAGNOSIS — Z302 Encounter for sterilization: Secondary | ICD-10-CM

## 2013-02-04 DIAGNOSIS — Z01812 Encounter for preprocedural laboratory examination: Secondary | ICD-10-CM

## 2013-02-04 HISTORY — PX: LAPAROSCOPY: SHX197

## 2013-02-04 HISTORY — PX: BILATERAL SALPINGECTOMY: SHX5743

## 2013-02-04 SURGERY — LAPAROSCOPY OPERATIVE
Anesthesia: General

## 2013-02-04 MED ORDER — OXYCODONE-ACETAMINOPHEN 5-325 MG PO TABS
ORAL_TABLET | ORAL | Status: AC
  Filled 2013-02-04: qty 1

## 2013-02-04 MED ORDER — MIDAZOLAM HCL 2 MG/2ML IJ SOLN
INTRAMUSCULAR | Status: AC
  Filled 2013-02-04: qty 2

## 2013-02-04 MED ORDER — OXYCODONE-ACETAMINOPHEN 5-325 MG PO TABS
1.0000 | ORAL_TABLET | Freq: Once | ORAL | Status: AC
  Administered 2013-02-04: 1 via ORAL

## 2013-02-04 MED ORDER — IBUPROFEN 800 MG PO TABS: 800.0000 mg | ORAL_TABLET | Freq: Three times a day (TID) | ORAL | Status: DC | PRN

## 2013-02-04 MED ORDER — PROPOFOL 10 MG/ML IV EMUL
INTRAVENOUS | Status: DC | PRN
  Administered 2013-02-04: 20 mg via INTRAVENOUS
  Administered 2013-02-04: 150 mg via INTRAVENOUS

## 2013-02-04 MED ORDER — LACTATED RINGERS IV SOLN
INTRAVENOUS | Status: DC | PRN
  Administered 2013-02-04 (×2): via INTRAVENOUS

## 2013-02-04 MED ORDER — CEFAZOLIN SODIUM-DEXTROSE 2-3 GM-% IV SOLR
INTRAVENOUS | Status: AC
  Filled 2013-02-04: qty 50

## 2013-02-04 MED ORDER — LACTATED RINGERS IV SOLN
INTRAVENOUS | Status: DC
  Administered 2013-02-04: 14:00:00 via INTRAVENOUS

## 2013-02-04 MED ORDER — MIDAZOLAM HCL 5 MG/5ML IJ SOLN
INTRAMUSCULAR | Status: DC | PRN
  Administered 2013-02-04: 2 mg via INTRAVENOUS

## 2013-02-04 MED ORDER — FENTANYL CITRATE 0.05 MG/ML IJ SOLN
INTRAMUSCULAR | Status: DC | PRN
  Administered 2013-02-04: 50 ug via INTRAVENOUS
  Administered 2013-02-04 (×3): 100 ug via INTRAVENOUS

## 2013-02-04 MED ORDER — DEXAMETHASONE SODIUM PHOSPHATE 10 MG/ML IJ SOLN
INTRAMUSCULAR | Status: AC
  Filled 2013-02-04: qty 1

## 2013-02-04 MED ORDER — ONDANSETRON HCL 4 MG/2ML IJ SOLN
INTRAMUSCULAR | Status: DC | PRN
  Administered 2013-02-04: 4 mg via INTRAVENOUS

## 2013-02-04 MED ORDER — KETOROLAC TROMETHAMINE 30 MG/ML IJ SOLN
INTRAMUSCULAR | Status: AC
  Filled 2013-02-04: qty 1

## 2013-02-04 MED ORDER — FENTANYL CITRATE 0.05 MG/ML IJ SOLN
25.0000 ug | INTRAMUSCULAR | Status: DC | PRN
  Administered 2013-02-04: 25 ug via INTRAVENOUS

## 2013-02-04 MED ORDER — BUPIVACAINE HCL (PF) 0.5 % IJ SOLN
INTRAMUSCULAR | Status: AC
  Filled 2013-02-04: qty 30

## 2013-02-04 MED ORDER — FENTANYL CITRATE 0.05 MG/ML IJ SOLN
INTRAMUSCULAR | Status: AC
  Filled 2013-02-04: qty 5

## 2013-02-04 MED ORDER — NEOSTIGMINE METHYLSULFATE 1 MG/ML IJ SOLN
INTRAMUSCULAR | Status: DC | PRN
  Administered 2013-02-04: 3 mg via INTRAVENOUS

## 2013-02-04 MED ORDER — ROCURONIUM BROMIDE 100 MG/10ML IV SOLN
INTRAVENOUS | Status: DC | PRN
  Administered 2013-02-04: 30 mg via INTRAVENOUS

## 2013-02-04 MED ORDER — FENTANYL CITRATE 0.05 MG/ML IJ SOLN
INTRAMUSCULAR | Status: AC
  Filled 2013-02-04: qty 2

## 2013-02-04 MED ORDER — BUPIVACAINE HCL (PF) 0.5 % IJ SOLN
INTRAMUSCULAR | Status: DC | PRN
  Administered 2013-02-04: 10 mL

## 2013-02-04 MED ORDER — ROCURONIUM BROMIDE 50 MG/5ML IV SOLN
INTRAVENOUS | Status: AC
  Filled 2013-02-04: qty 1

## 2013-02-04 MED ORDER — CEFAZOLIN SODIUM-DEXTROSE 2-3 GM-% IV SOLR: 2.0000 g | INTRAVENOUS | Status: DC

## 2013-02-04 MED ORDER — GLYCOPYRROLATE 0.2 MG/ML IJ SOLN
INTRAMUSCULAR | Status: DC | PRN
  Administered 2013-02-04: .4 mg via INTRAVENOUS

## 2013-02-04 MED ORDER — DEXAMETHASONE SODIUM PHOSPHATE 4 MG/ML IJ SOLN
INTRAMUSCULAR | Status: DC | PRN
  Administered 2013-02-04: 10 mg via INTRAVENOUS

## 2013-02-04 MED ORDER — MEPERIDINE HCL 25 MG/ML IJ SOLN: 6.2500 mg | INTRAMUSCULAR | Status: DC | PRN

## 2013-02-04 MED ORDER — KETOROLAC TROMETHAMINE 60 MG/2ML IM SOLN
INTRAMUSCULAR | Status: DC | PRN
  Administered 2013-02-04: 30 mg via INTRAMUSCULAR

## 2013-02-04 MED ORDER — ONDANSETRON HCL 4 MG/2ML IJ SOLN
INTRAMUSCULAR | Status: AC
  Filled 2013-02-04: qty 2

## 2013-02-04 MED ORDER — FENTANYL CITRATE 0.05 MG/ML IJ SOLN
INTRAMUSCULAR | Status: AC
  Administered 2013-02-04: 25 ug via INTRAVENOUS
  Filled 2013-02-04: qty 2

## 2013-02-04 MED ORDER — METOCLOPRAMIDE HCL 5 MG/ML IJ SOLN: 10.0000 mg | Freq: Once | INTRAMUSCULAR | Status: DC | PRN

## 2013-02-04 MED ORDER — LIDOCAINE HCL (CARDIAC) 20 MG/ML IV SOLN
INTRAVENOUS | Status: AC
  Filled 2013-02-04: qty 5

## 2013-02-04 MED ORDER — PROPOFOL 10 MG/ML IV EMUL
INTRAVENOUS | Status: AC
  Filled 2013-02-04: qty 20

## 2013-02-04 MED ORDER — OXYCODONE-ACETAMINOPHEN 5-325 MG PO TABS: 1.0000 | ORAL_TABLET | ORAL | Status: DC | PRN

## 2013-02-04 SURGICAL SUPPLY — 30 items
APPLICATOR COTTON TIP 6IN STRL (MISCELLANEOUS) ×2 IMPLANT
BAG SPEC RTRVL LRG 6X4 10 (ENDOMECHANICALS)
CABLE HIGH FREQUENCY MONO STRZ (ELECTRODE) IMPLANT
CATH ROBINSON RED A/P 16FR (CATHETERS) ×2 IMPLANT
CHLORAPREP W/TINT 26ML (MISCELLANEOUS) ×2 IMPLANT
CLOTH BEACON ORANGE TIMEOUT ST (SAFETY) ×2 IMPLANT
ELECT REM PT RETURN 9FT ADLT (ELECTROSURGICAL)
ELECTRODE REM PT RTRN 9FT ADLT (ELECTROSURGICAL) IMPLANT
FORCEPS CUTTING 33CM 5MM (CUTTING FORCEPS) IMPLANT
FORCEPS CUTTING 45CM 5MM (CUTTING FORCEPS) IMPLANT
GLOVE BIO SURGEON STRL SZ 6.5 (GLOVE) ×4 IMPLANT
GOWN PREVENTION PLUS LG XLONG (DISPOSABLE) ×4 IMPLANT
NDL SAFETY ECLIPSE 18X1.5 (NEEDLE) ×1 IMPLANT
NEEDLE HYPO 18GX1.5 SHARP (NEEDLE) ×2
NEEDLE INSUFFLATION 120MM (ENDOMECHANICALS) ×2 IMPLANT
NS IRRIG 1000ML POUR BTL (IV SOLUTION) ×2 IMPLANT
PACK LAPAROSCOPY BASIN (CUSTOM PROCEDURE TRAY) ×2 IMPLANT
POUCH SPECIMEN RETRIEVAL 10MM (ENDOMECHANICALS) IMPLANT
PROTECTOR NERVE ULNAR (MISCELLANEOUS) ×2 IMPLANT
SCALPEL HARMONIC ACE (MISCELLANEOUS) ×1 IMPLANT
SET IRRIG TUBING LAPAROSCOPIC (IRRIGATION / IRRIGATOR) IMPLANT
STRIP CLOSURE SKIN 1/2X4 (GAUZE/BANDAGES/DRESSINGS) ×2 IMPLANT
SUT VICRYL 0 ENDOLOOP (SUTURE) IMPLANT
SUT VICRYL 0 UR6 27IN ABS (SUTURE) ×2 IMPLANT
SUT VICRYL 4-0 PS2 18IN ABS (SUTURE) ×4 IMPLANT
TOWEL OR 17X24 6PK STRL BLUE (TOWEL DISPOSABLE) ×4 IMPLANT
TROCAR XCEL NON-BLD 11X100MML (ENDOMECHANICALS) IMPLANT
TROCAR XCEL NON-BLD 5MMX100MML (ENDOMECHANICALS) IMPLANT
WARMER LAPAROSCOPE (MISCELLANEOUS) ×2 IMPLANT
WATER STERILE IRR 1000ML POUR (IV SOLUTION) ×2 IMPLANT

## 2013-02-04 NOTE — Transfer of Care (Signed)
Immediate Anesthesia Transfer of Care Note  Patient: Lori Huerta  Procedure(s) Performed: Procedure(s) (LRB) with comments: LAPAROSCOPY OPERATIVE (N/A) BILATERAL SALPINGECTOMY (N/A)  Patient Location: PACU  Anesthesia Type:General  Level of Consciousness: awake and oriented  Airway & Oxygen Therapy: Patient Spontanous Breathing and Patient connected to nasal cannula oxygen  Post-op Assessment: Report given to PACU RN and Post -op Vital signs reviewed and stable  Post vital signs: Reviewed and stable  Complications: No apparent anesthesia complications

## 2013-02-04 NOTE — Anesthesia Preprocedure Evaluation (Signed)
Anesthesia Evaluation  Patient identified by MRN, date of birth, ID band Patient awake    Reviewed: Allergy & Precautions, H&P , NPO status , Patient's Chart, lab work & pertinent test results  Airway Mallampati: III TM Distance: >3 FB     Dental No notable dental hx. (+) Teeth Intact   Pulmonary neg pulmonary ROS,  breath sounds clear to auscultation  Pulmonary exam normal       Cardiovascular hypertension, Rhythm:Regular Rate:Normal     Neuro/Psych Anxiety Bipolar Disordernegative neurological ROS     GI/Hepatic negative GI ROS, Neg liver ROS,   Endo/Other  negative endocrine ROS  Renal/GU Renal diseaseRenal Calculi  negative genitourinary   Musculoskeletal negative musculoskeletal ROS (+)   Abdominal Normal abdominal exam  (+)   Peds  Hematology negative hematology ROS (+)   Anesthesia Other Findings   Reproductive/Obstetrics negative OB ROS                           Anesthesia Physical Anesthesia Plan  ASA: II  Anesthesia Plan: General   Post-op Pain Management:    Induction: Intravenous  Airway Management Planned: Oral ETT  Additional Equipment:   Intra-op Plan:   Post-operative Plan: Extubation in OR  Informed Consent: I have reviewed the patients History and Physical, chart, labs and discussed the procedure including the risks, benefits and alternatives for the proposed anesthesia with the patient or authorized representative who has indicated his/her understanding and acceptance.   Dental advisory given  Plan Discussed with: CRNA, Anesthesiologist and Surgeon  Anesthesia Plan Comments:         Anesthesia Quick Evaluation

## 2013-02-04 NOTE — Op Note (Signed)
02/04/2013  4:02 PM  PATIENT:  Lori Huerta  27 y.o. female  PRE-OPERATIVE DIAGNOSIS:  Desires sterilization and removal of Mirena IUD  POST-OPERATIVE DIAGNOSIS: same  PROCEDURE:  Procedure(s) (LRB) with comments: LAPAROSCOPY OPERATIVE (N/A) BILATERAL SALPINGECTOMY (N/A) REMOVAL OF MIRENA IUD  SURGEON:  Surgeon(s) and Role:    * Allie Bossier, MD - Primary     PHYSICIAN ASSISTANT:   ASSISTANTS: Elsie Lincoln, MD  ANESTHESIA:   general  EBL:  Total I/O In: 1000 [I.V.:1000] Out: 10 [Blood:10]  BLOOD ADMINISTERED:none  DRAINS: none   LOCAL MEDICATIONS USED:  MARCAINE     SPECIMEN:  Source of Specimen:  both oviducts  DISPOSITION OF SPECIMEN:  PATHOLOGY  COUNTS:  YES  TOURNIQUET:  * No tourniquets in log *  DICTATION: .Dragon Dictation  PLAN OF CARE: Discharge to home after PACU  PATIENT DISPOSITION:  PACU - hemodynamically stable.   Delay start of Pharmacological VTE agent (>24hrs) due to surgical blood loss or risk of bleeding: not applicable  The risks, benefits, and alternatives of surgery were explained, understood, accepted. She is certain that she wants permanent sterility. She understands there is a small failure rate of this procedure. She also would like to have both oviducts removed her due newest recommendations to help prevent ovarian/peritoneal cancer. She understands that this is not reversible. In the operating room she was placed in the dorsal lithotomy position, and general anesthesia was given without complication. Her abdomen and vagina were prepped and draped in the usual sterile fashion. A timeout procedure was done. A bimanual exam revealed a small anteverted and mobile uterus. Her adnexa felt normal.  Her bladder was emptied with a Robinson catheter. I removed her Mirena intact. Her uterus felt so small that I placed a tenaculum on the cervix instead of a Hulka manipulator to prevent uterine perforation. Gloves were changed  and attention was  turned to the abdomen. Approximately the 5 mL of 0.5% Marcaine was injected into the umbilicus. A vertical incision was made at the site. A varies needle was placed intraperitoneally. Low-flow CO2 was used to insufflate the abdomen to approximately 3-1/2 L. Once a good pneumoperitoneum was established, a 10 mm Excel trocar was placed. Laparoscopy confirmed correct placement.a 5 mm port was placed in each lower quadrant under direct laparoscopic visualization after injecting 0.5% Marcaine in the incision sites. Her pelvis appeared normal. A Harmonic scapel was used to hemostatically removed both oviducts. I switched to a 5 mm camera and removed the oviducts through t a he umbilical port. I removed the 5 mm ports and noted hemostasis. A subcuticular closure was done with 4-0 Vicryl suture was used to close all three skin incisions and a Steri-Strip was placed across the incisions. She was extubated and taken to the recov will ery room in stable condition.

## 2013-02-04 NOTE — H&P (Signed)
Lori Huerta is an 27 y.o. M AA here for permanent sterility in the form of bilateral salpingectomy. Her husband is in agreement and refuses a vasectomy or other forms of birth control. She would like her Mirena removed in the OR.   Menstrual History: Menarche age: 3 Periods prior to the Mirena were not terribly painful or heavy.  No LMP recorded. Patient is not currently having periods (Reason: IUD).    Past Medical History  Diagnosis Date  . Bipolar 1 disorder   . Anxiety   . HYPERTENSION 12/07/2010    only with past pregnancy  . Kidney stones     Past Surgical History  Procedure Date  . Cesarean section     No family history on file.  Social History:  reports that she has never smoked. She does not have any smokeless tobacco history on file. She reports that she drinks alcohol. She reports that she does not use illicit drugs.  Allergies: No Known Allergies  Prescriptions prior to admission  Medication Sig Dispense Refill  . LORazepam (ATIVAN) 0.5 MG tablet Take 1 tablet (0.5 mg total) by mouth daily as needed. Anxiety  30 tablet  0  . meloxicam (MOBIC) 15 MG tablet Take 15 mg by mouth at bedtime.      . cyclobenzaprine (FLEXERIL) 5 MG tablet TAKE ONE TO TWO TABLETS BY MOUTH TWICE DAILY AS NEEDED FOR PAIN  30 tablet  0    ROS Her sons are 2 and 34 yo. She works at [employer]  Blood pressure 130/76, pulse 85, temperature 98.2 F (36.8 C), temperature source Oral, resp. rate 18, SpO2 100.00%. Physical Exam Heart- rrr Lungs- CTAB Abd- benign  Results for orders placed during the hospital encounter of 02/04/13 (from the past 24 hour(s))  PREGNANCY, URINE     Status: Normal   Collection Time   02/04/13  1:35 PM      Component Value Range   Preg Test, Ur NEGATIVE  NEGATIVE    No results found.  Assessment/Plan: She desires permanent sterillity. She understands that this is NOT reversible. She understands there are risks of surgery.  Makaia Rappa C. 02/04/2013,  3:00 PM

## 2013-02-04 NOTE — Anesthesia Postprocedure Evaluation (Signed)
  Anesthesia Post-op Note  Patient: Lori Huerta  Procedure(s) Performed: Procedure(s) (LRB) with comments: LAPAROSCOPY OPERATIVE (N/A) BILATERAL SALPINGECTOMY (N/A)  Patient Location: PACU  Anesthesia Type:General  Level of Consciousness: awake, alert  and oriented  Airway and Oxygen Therapy: Patient Spontanous Breathing  Post-op Pain: mild  Post-op Assessment: Post-op Vital signs reviewed, Patient's Cardiovascular Status Stable, Respiratory Function Stable, Patent Airway, No signs of Nausea or vomiting and Pain level controlled  Post-op Vital Signs: Reviewed and stable  Complications: No apparent anesthesia complications

## 2013-02-06 ENCOUNTER — Encounter (HOSPITAL_COMMUNITY): Payer: Self-pay | Admitting: Obstetrics & Gynecology

## 2013-02-14 ENCOUNTER — Other Ambulatory Visit: Payer: Self-pay

## 2013-03-13 ENCOUNTER — Ambulatory Visit: Payer: Medicaid Other

## 2013-03-16 ENCOUNTER — Other Ambulatory Visit: Payer: Self-pay | Admitting: Family Medicine

## 2013-03-16 MED ORDER — IBUPROFEN 800 MG PO TABS: 800.0000 mg | ORAL_TABLET | Freq: Three times a day (TID) | ORAL | Status: DC | PRN

## 2013-04-07 ENCOUNTER — Encounter: Payer: Self-pay | Admitting: Family Medicine

## 2013-04-07 ENCOUNTER — Ambulatory Visit (INDEPENDENT_AMBULATORY_CARE_PROVIDER_SITE_OTHER): Payer: Medicaid Other | Admitting: Family Medicine

## 2013-04-07 VITALS — BP 117/85 | HR 73 | Ht 66.0 in | Wt 136.0 lb

## 2013-04-07 DIAGNOSIS — M549 Dorsalgia, unspecified: Secondary | ICD-10-CM

## 2013-04-07 MED ORDER — MELOXICAM 15 MG PO TABS: 15.0000 mg | ORAL_TABLET | Freq: Every day | ORAL | Status: DC

## 2013-04-07 NOTE — Assessment & Plan Note (Signed)
Explained caution and risk of GI bleed and patient elects to use mobic I'm glad she gets good relief with this regimen, advised to not use it on days when she wont work and she doesn't think sh ewill exacerbate it  Continue PRN flexeril No Neuro red flags Exercised given, consider PT if worsens/persists F/u PRN,

## 2013-04-07 NOTE — Progress Notes (Signed)
  Subjective:    Patient ID: Lori Huerta, female    DOB: [birth year], 27 y.o.   MRN: [id number]  HPI Pt here for f/u back pain  Previously using mobic and intermitent flexeril with very good relief Used mobic nightly, I de-escalated to 800 Ibuprofen, which is working but she has to bwait for pain to come and then take the med which she doesnt like  Chronic burning/dull 7-9/10 back pain between shoulder blades exacerbated by work and physical activity No weakness/numnbess in arms, legs, bowel/bladder dysf, saddle anestesia Able to perform her routine tasks Wants to use mobic again   Review of Systems Per HPI    Objective:   Physical Exam  Gen: NAD, alert, cooperative with exam CV: RRR, good S1/S2, no murmur Resp: CTABL, no wheezes, non-labored Abd: SNTND, BS present, no guarding or organomegaly, well healed umbilical scar MSK: No bony tendernessof the entire spine form ASUS to cervical spine or paraspinal tenderness, full ROM of back  Neuro: Alert and oriented, No gross deficits, strength 5/5 and sensation intact in all 4 extremities     Assessment & Plan:

## 2013-04-07 NOTE — Patient Instructions (Signed)
Back Exercises These exercises may help you when beginning to rehabilitate your injury. Your symptoms may resolve with or without further involvement from your physician, physical therapist or athletic trainer. While completing these exercises, remember:   Restoring tissue flexibility helps normal motion to return to the joints. This allows healthier, less painful movement and activity.  An effective stretch should be held for at least 30 seconds. A stretch should never be painful. You should only feel a gentle lengthening or release RANGE OF MOTION (ROM) AND STRETCHING EXERCISES - Mid-Back Strain These exercises may help you when beginning to rehabilitate your injury. In order to successfully resolve your symptoms, you must improve your posture. These exercises are designed to help reduce the forward-head and rounded-shoulder posture which contributes to this condition. Your symptoms may resolve with or without further involvement from your physician, physical therapist or athletic trainer. While completing these exercises, remember:  Restoring tissue flexibility helps normal motion to return to the joints. This allows healthier, less painful movement and activity. An effective stretch should be held for at least 30 seconds. A stretch should never be painful. You should only feel a gentle lengthening or release in the stretched tissue. STRETECH - Axial Extension Stand or sit on a firm surface. Assume a good posture: chest up, shoulders drawn back, stomach muscles slightly tense, knees unlocked (if standing) and feet hip width apart. Slowly retract your chin, so your head slides back and your chin slightly lowers. Continue to look straight ahead. You should feel a gentle stretch in the back of your head. Be certain not to feel an aggressive stretch since this can cause headaches later. Hold for __________ seconds. Repeat __________ times. Complete this exercise __________ times per day. RANGE OF  MOTION- Upper Thoracic Extension Sit on a firm chair with a high back. Assume a good posture: chest up, shoulders drawn back, abdominal muscles slightly tense, and feet hip width apart. Place a small pillow or folded towel in the curve of your lower back, if you are having difficulty maintaining good posture. Gently brace your neck with your hands, allowing your arms to rest on your chest. Continue to support your neck and slowly extend your back over the chair. You will feel a stretch across your upper back. Hold __________ seconds. Slowly return to the starting position. Repeat __________ times. Complete this exercise __________ times per day. RANGE OF MOTION- Mid-Thoracic Extension Roll a towel so that it is about 4 inches in diameter. Position the towel lengthwise. Lay on the towel so that your spine, but not your shoulder blades, are supported. You should feel your mid-back arching toward the floor. To increase the stretch, extend your arms away from your body. Hold for __________ seconds. Repeat exercise __________ times, __________ times per day. STRENGTHENING EXERCISES - Mid-Back Strain These exercises may help you when beginning to rehabilitate your injury. They may resolve your symptoms with or without further involvement from your physician, physical therapist or athletic trainer. While completing these exercises, remember:  Muscles can gain both the endurance and the strength needed for everyday activities through controlled exercises. Complete these exercises as instructed by your physician, physical therapist or athletic trainer. Increase the resistance and repetitions only as guided by your caregiver. You may experience muscle soreness or fatigue, but the pain or discomfort you are trying to eliminate should never worsen during these exercises. If this pain does worsen, stop and make certain you are following the directions exactly. If the pain is still  present after adjustments,  discontinue the exercise until you can discuss the trouble with your caregiver. STRENGTHENING Quadruped, Opposite UE/LE Lift Assume a hands and knees position on a firm surface. Keep your hands under your shoulders and your knees under your hips. You may place padding under your knees for comfort. Find your neutral spine and gently tense your abdominal muscles so that you can maintain this position. Your shoulders and hips should form a rectangle that is parallel with the floor and is not twisted. Keeping your trunk steady, lift your right hand no higher than your shoulder and then your left leg no higher than your hip. Make sure you are not holding your breath. Hold this position __________ seconds. Continuing to keep your abdominal muscles tense and your back steady, slowly return to your starting position. Repeat with the opposite arm and leg. Repeat __________ times. Complete this exercise __________ times per day.  STRENGTH - Shoulder Extensors Secure a rubber exercise band or tubing to a fixed object (table, pole) so that it is at the height of your shoulders when you are either standing, or sitting on a firm armless chair. With a thumbs-up grip, grasp an end of the band in each hand. Straighten your elbows and lift your hands straight in front of you at shoulder height. Step back away from the secured end of band, until it becomes tense. Squeezing your shoulder blades together, pull your hands down to the sides of your thighs. Do not allow your hands to go behind you. Hold for __________ seconds. Slowly ease the tension on the band, as you reverse the directions and return to the starting position. Repeat __________ times. Complete this exercise __________ times per day.  STRENGTH - Horizontal Abductors Choose one of the two positions to complete this exercise. Prone: lying on stomach: Lie on your stomach on a firm surface so that your right / left arm overhangs the edge. Rest your forehead on  your opposite forearm. With your palm facing the floor and your elbow straight, hold a __________ weight in your hand. Squeeze your right / left shoulder blade to your mid-back spine and then slowly raise your arm to the height of the bed. Hold for __________ seconds. Slowly reverse the directions and return to the starting position, controlling the weight as you lower your arm. Repeat __________ times. Complete this exercise __________ times per day. Standing:  Secure a rubber exercise band or tubing, so that it is at the height of your shoulders when you are either standing, or sitting on a firm armless chair. Grasp an end of the band in each hand and have your palms face each other. Straighten your elbows and lift your hands straight in front of you at shoulder height. Step back away from the secured end of band, until it becomes tense. Squeeze your shoulder blades together. Keeping your elbows locked and your hands at shoulder height, spread your arms apart, forming a "T" shape with your body. Hold __________ seconds. Slowly ease the tension on the band, as you reverse the directions and return to the starting position. Repeat __________ times. Complete this exercise __________ times per day. STRENGTH - Scapular Retractors and External Rotators, Rowing Secure a rubber exercise band or tubing, so that it is at the height of your shoulders when you are either standing, or sitting on a firm armless chair. With a palm-down grip, grasp an end of the band in each hand. Straighten your elbows and lift your hands  straight in front of you at shoulder height. Step back away from the secured end of band, until it becomes tense. Step 1: Squeeze your shoulder blades together. Bending your elbows, draw your hands to your chest as if you are rowing a boat. At the end of this motion, your hands and elbow should be at shoulder height and your elbows should be out to your sides. Step 2: Rotate your shoulder to raise  your hands above your head. Your forearms should be vertical and your upper arms should be horizontal. Hold for __________ seconds. Slowly ease the tension on the band, as you reverse the directions and return to the starting position. Repeat __________ times. Complete this exercise __________ times per day.  POSTURE AND BODY MECHANICS CONSIDERATIONS  Mid-Back Strain Keeping correct posture when sitting, standing or completing your activities will reduce the stress put on different body tissues, allowing injured tissues a chance to heal and limiting painful experiences. The following are general guidelines for improved posture. Your physician or physical therapist will provide you with any instructions specific to your needs. While reading these guidelines, remember: The exercises prescribed by your provider will help you have the flexibility and strength to maintain correct postures. The correct posture provides the best environment for your joints to work. All of your joints have less wear and tear when properly supported by a spine with good posture. This means you will experience a healthier, less painful body. Correct posture must be practiced with all of your activities, especially prolonged sitting and standing. Correct posture is as important when doing repetitive low-stress activities (typing) as it is when doing a single heavy-load activity (lifting). PROPER SITTING POSTURE In order to minimize stress and discomfort on your spine, you must sit with correct posture. Sitting with good posture should be effortless for a healthy body. Returning to good posture is a gradual process. Many people can work toward this most comfortably by using various supports until they have the flexibility and strength to maintain this posture on their own. When sitting with proper posture, your ears will fall over your shoulders and your shoulders will fall over your hips. You should use the back of the chair to  support your upper back. Your lower back will be in a neutral position, just slightly arched. You may place a small pillow or folded towel at the base of your low back for  support.  When working at a desk, create an environment that supports good, upright posture. Without extra support, muscles fatigue and lead to excessive strain on joints and other tissues. Keep these recommendations in mind: CHAIR: A chair should be able to slide under your desk when your back makes contact with the back of the chair. This allows you to work closely. The chair's height should allow your eyes to be level with the upper part of your monitor and your hands to be slightly lower than your elbows. BODY POSITION Your feet should make contact with the floor. If this is not possible, use a foot rest. Keep your ears over your shoulders. This will reduce stress on your neck and lower back. INCORRECT SITTING POSTURES If you are feeling tired and unable to assume a healthy sitting posture, do not slouch or slump. This puts excessive strain on your back tissues, causing more damage and pain. Healthier options include: Using more support, like a lumbar pillow. Switching tasks to something that requires you to be upright or walking. Talking a brief walk. Lying  down to rest in a neutral-spine position. CORRECT STANDING POSTURES Proper standing posture should be assumed with all daily activities, even if they only take a few moments, like when brushing your teeth. As in sitting, your ears should fall over your shoulders and your shoulders should fall over your hips. You should keep a slight tension in your abdominal muscles to brace your spine. Your tailbone should point down to the ground, not behind your body, resulting in an over-extended swayback posture.  INCORRECT STANDING POSTURES Common incorrect standing postures include a forward head, locked knees, and an excessive swayback. WALKING Walk with an upright posture.  Your ears, shoulders and hips should all line-up. CORRECT LIFTING TECHNIQUES DO :  Assume a wide stance. This will provide you more stability and the opportunity to get as close as possible to the object which you are lifting. Tense your abdominals to brace your spine. Bend at the knees and hips. Keeping your back locked in a neutral-spine position, lift using your leg muscles. Lift with your legs, keeping your back straight. Test the weight of unknown objects before attempting to lift them. Try to keep your elbows locked down at your sides in order get the best strength from your shoulders when carrying an object. Always ask for help when lifting heavy or awkward objects. INCORRECT LIFTING TECHNIQUES DO NOT:  Lock your knees when lifting, even if it is a small object. Bend and twist. Pivot at your feet or move your feet when needing to change directions. Assume that you can safely pick up even a paperclip without proper posture. Document Released: 12/17/2005 Document Revised: 03/10/2012 Document Reviewed: 03/31/2009 [facility] Patient Information 2013 [place], [place].   in the stretched tissue. STRETCH  Extension, Prone on Elbows   Lie on your stomach on the floor, a bed will be too soft. Place your palms about shoulder width apart and at the height of your head.  Place your elbows under your shoulders. If this is too painful, stack pillows under your chest.  Allow your body to relax so that your hips drop lower and make contact more completely with the floor.  Hold this position for __________ seconds.  Slowly return to lying flat on the floor. Repeat __________ times. Complete this exercise __________ times per day.  RANGE OF MOTION  Extension, Prone Press Ups   Lie on your stomach on the floor, a bed will be too soft. Place your palms about shoulder width apart and at the height of your head.  Keeping your back as relaxed as possible, slowly straighten your elbows while keeping  your hips on the floor. You may adjust the placement of your hands to maximize your comfort. As you gain motion, your hands will come more underneath your shoulders.  Hold this position __________ seconds.  Slowly return to lying flat on the floor. Repeat __________ times. Complete this exercise __________ times per day.  RANGE OF MOTION- Quadruped, Neutral Spine   Assume a hands and knees position on a firm surface. Keep your hands under your shoulders and your knees under your hips. You may place padding under your knees for comfort.  Drop your head and point your tail bone toward the ground below you. This will round out your low back like an angry cat. Hold this position for __________ seconds.  Slowly lift your head and release your tail bone so that your back sags into a large arch, like an old horse.  Hold this position for __________ seconds.  Repeat this until you feel limber in your low back.  Now, find your "sweet spot." This will be the most comfortable position somewhere between the two previous positions. This is your neutral spine. Once you have found this position, tense your stomach muscles to support your low back.  Hold this position for __________ seconds. Repeat __________ times. Complete this exercise __________ times per day.  STRETCH  Flexion, Single Knee to Chest   Lie on a firm bed or floor with both legs extended in front of you.  Keeping one leg in contact with the floor, bring your opposite knee to your chest. Hold your leg in place by either grabbing behind your thigh or at your knee.  Pull until you feel a gentle stretch in your low back. Hold __________ seconds.  Slowly release your grasp and repeat the exercise with the opposite side. Repeat __________ times. Complete this exercise __________ times per day.  STRETCH - Hamstrings, Standing  Stand or sit and extend your right / left leg, placing your foot on a chair or foot stool  Keeping a slight  arch in your low back and your hips straight forward.  Lead with your chest and lean forward at the waist until you feel a gentle stretch in the back of your right / left knee or thigh. (When done correctly, this exercise requires leaning only a small distance.)  Hold this position for __________ seconds. Repeat __________ times. Complete this stretch __________ times per day. STRENGTHENING  Deep Abdominals, Pelvic Tilt   Lie on a firm bed or floor. Keeping your legs in front of you, bend your knees so they are both pointed toward the ceiling and your feet are flat on the floor.  Tense your lower abdominal muscles to press your low back into the floor. This motion will rotate your pelvis so that your tail bone is scooping upwards rather than pointing at your feet or into the floor.  With a gentle tension and even breathing, hold this position for __________ seconds. Repeat __________ times. Complete this exercise __________ times per day.  STRENGTHENING  Abdominals, Crunches   Lie on a firm bed or floor. Keeping your legs in front of you, bend your knees so they are both pointed toward the ceiling and your feet are flat on the floor. Cross your arms over your chest.  Slightly tip your chin down without bending your neck.  Tense your abdominals and slowly lift your trunk high enough to just clear your shoulder blades. Lifting higher can put excessive stress on the low back and does not further strengthen your abdominal muscles.  Control your return to the starting position. Repeat __________ times. Complete this exercise __________ times per day.  STRENGTHENING  Quadruped, Opposite UE/LE Lift   Assume a hands and knees position on a firm surface. Keep your hands under your shoulders and your knees under your hips. You may place padding under your knees for comfort.  Find your neutral spine and gently tense your abdominal muscles so that you can maintain this position. Your shoulders and  hips should form a rectangle that is parallel with the floor and is not twisted.  Keeping your trunk steady, lift your right hand no higher than your shoulder and then your left leg no higher than your hip. Make sure you are not holding your breath. Hold this position __________ seconds.  Continuing to keep your abdominal muscles tense and your back steady, slowly return to your starting position.  Repeat with the opposite arm and leg. Repeat __________ times. Complete this exercise __________ times per day. Document Released: 01/04/2006 Document Revised: 03/10/2012 Document Reviewed: 03/31/2009 [facility] Patient Information 2013 [place], [place].

## 2013-04-22 ENCOUNTER — Encounter (HOSPITAL_COMMUNITY): Payer: Self-pay

## 2013-04-22 ENCOUNTER — Emergency Department (HOSPITAL_COMMUNITY)
Admission: EM | Admit: 2013-04-22 | Discharge: 2013-04-22 | Disposition: A | Discharge status: Home / Self Care | Payer: Self-pay | Attending: Emergency Medicine | Admitting: Emergency Medicine

## 2013-04-22 DIAGNOSIS — Z8742 Personal history of other diseases of the female genital tract: Secondary | ICD-10-CM | POA: Insufficient documentation

## 2013-04-22 DIAGNOSIS — Z87442 Personal history of urinary calculi: Secondary | ICD-10-CM | POA: Insufficient documentation

## 2013-04-22 DIAGNOSIS — R350 Frequency of micturition: Secondary | ICD-10-CM | POA: Insufficient documentation

## 2013-04-22 DIAGNOSIS — Z79899 Other long term (current) drug therapy: Secondary | ICD-10-CM | POA: Insufficient documentation

## 2013-04-22 DIAGNOSIS — F411 Generalized anxiety disorder: Secondary | ICD-10-CM | POA: Insufficient documentation

## 2013-04-22 DIAGNOSIS — Z8659 Personal history of other mental and behavioral disorders: Secondary | ICD-10-CM | POA: Insufficient documentation

## 2013-04-22 DIAGNOSIS — M549 Dorsalgia, unspecified: Secondary | ICD-10-CM | POA: Insufficient documentation

## 2013-04-22 DIAGNOSIS — Z8614 Personal history of Methicillin resistant Staphylococcus aureus infection: Secondary | ICD-10-CM | POA: Insufficient documentation

## 2013-04-22 DIAGNOSIS — N390 Urinary tract infection, site not specified: Secondary | ICD-10-CM

## 2013-04-22 DIAGNOSIS — Z3202 Encounter for pregnancy test, result negative: Secondary | ICD-10-CM | POA: Insufficient documentation

## 2013-04-22 LAB — URINE MICROSCOPIC-ADD ON

## 2013-04-22 LAB — URINALYSIS, ROUTINE W REFLEX MICROSCOPIC
Bilirubin Urine: NEGATIVE
Ketones, ur: NEGATIVE mg/dL
Nitrite: POSITIVE — AB
pH: 6 (ref 5.0–8.0)

## 2013-04-22 MED ORDER — SULFAMETHOXAZOLE-TMP DS 800-160 MG PO TABS
1.0000 | ORAL_TABLET | Freq: Once | ORAL | Status: AC
  Administered 2013-04-22: 1 via ORAL
  Filled 2013-04-22: qty 1

## 2013-04-22 MED ORDER — SULFAMETHOXAZOLE-TRIMETHOPRIM 800-160 MG PO TABS: 1.0000 | ORAL_TABLET | Freq: Two times a day (BID) | ORAL | Status: DC

## 2013-04-22 NOTE — ED Provider Notes (Signed)
History     CSN: [id number]  Arrival date & time 04/22/13  1054   First MD Initiated Contact with Patient 04/22/13 1356      Chief Complaint  Patient presents with  . Flank Pain    (Consider location/radiation/quality/duration/timing/severity/associated sxs/prior treatment) Patient is a 27 y.o. female presenting with flank pain. The history is provided by the patient.  Flank Pain This is a new problem. The current episode started yesterday. The problem occurs intermittently. The problem has been unchanged. Associated symptoms include urinary symptoms. Pertinent negatives include no abdominal pain, anorexia, chills, fever, headaches, nausea, rash or vomiting. Nothing aggravates the symptoms.   Lori Huerta is a 27 y.o. female who presents to the ED with left flank pain. The pain started yesterday and she took her medications that she usually takes when she has back pain but they did not seem to help. (flexeril and ibuprofen). She came to be sure that she didn't have a UTI. She had a UTI in the past and was started on an antibiotic but it had to be changed because the urine culture grew out MRSA.   Past Medical History  Diagnosis Date  . Bipolar 1 disorder   . Anxiety   . HYPERTENSION 12/07/2010    only with past pregnancy  . Kidney stones     Past Surgical History  Procedure Laterality Date  . Cesarean section    . Laparoscopy  02/04/2013    Procedure: LAPAROSCOPY OPERATIVE;  Surgeon: Allie Bossier, MD;  Location: [facility];  Service: Gynecology;  Laterality: N/A;  . Bilateral salpingectomy  02/04/2013    Procedure: BILATERAL SALPINGECTOMY;  Surgeon: Allie Bossier, MD;  Location: [facility];  Service: Gynecology;  Laterality: N/A;    No family history on file.  History  Substance Use Topics  . Smoking status: Never Smoker   . Smokeless tobacco: Not on file  . Alcohol Use: Yes     Comment: occasionally    OB History   Grav Para Term Preterm Abortions TAB SAB Ect Mult Living            Review of Systems  Constitutional: Negative for fever and chills.  Gastrointestinal: Negative for nausea, vomiting, abdominal pain and anorexia.  Genitourinary: Positive for frequency and flank pain. Negative for dysuria and vaginal discharge.  Musculoskeletal: Positive for back pain.  Skin: Negative for rash.  Allergic/Immunologic: Negative for immunocompromised state.  Neurological: Negative for light-headedness and headaches.  Psychiatric/Behavioral: Negative for confusion. The patient is not nervous/anxious.     Allergies  Review of patient's allergies indicates no known allergies.  Home Medications   Current Outpatient Rx  Name  Route  Sig  Dispense  Refill  . cyclobenzaprine (FLEXERIL) 5 MG tablet      TAKE ONE TO TWO TABLETS BY MOUTH TWICE DAILY AS NEEDED FOR PAIN   30 tablet   0   . ibuprofen (ADVIL,MOTRIN) 800 MG tablet   Oral   Take 1 tablet (800 mg total) by mouth every 8 (eight) hours as needed for pain.   30 tablet   0   . LORazepam (ATIVAN) 0.5 MG tablet   Oral   Take 1 tablet (0.5 mg total) by mouth daily as needed. Anxiety   30 tablet   0   . meloxicam (MOBIC) 15 MG tablet   Oral   Take 1 tablet (15 mg total) by mouth at bedtime.   30 tablet   3   . oxyCODONE-acetaminophen (PERCOCET/ROXICET)  5-325 MG per tablet   Oral   Take 1 tablet by mouth every 4 (four) hours as needed for pain.   30 tablet   0   . sulfamethoxazole-trimethoprim (SEPTRA DS) 800-160 MG per tablet   Oral   Take 1 tablet by mouth 2 (two) times daily.   14 tablet   0     BP 110/74  Pulse 93  Temp(Src) 97.4 F (36.3 C) (Oral)  Resp 18  Ht 5\' 6"  (1.676 m)  Wt 140 lb (63.504 kg)  BMI 22.61 kg/m2  SpO2 100%  Physical Exam  Nursing note and vitals reviewed. Constitutional: She is oriented to person, place, and time. She appears well-developed and well-nourished. No distress.  HENT:  Head: Normocephalic and atraumatic.  Eyes: EOM are normal.  Neck: Neck  supple.  Cardiovascular: Normal rate, regular rhythm and normal heart sounds.   Pulmonary/Chest: Effort normal and breath sounds normal. No respiratory distress.  Abdominal: Soft. Bowel sounds are normal. There is no tenderness. There is CVA tenderness (left).  Musculoskeletal: Normal range of motion.  Neurological: She is alert and oriented to person, place, and time. No cranial nerve deficit.  Skin: Skin is warm and dry.  Psychiatric: She has a normal mood and affect. Her behavior is normal. Judgment and thought content normal.    ED Course  Procedures (including critical care time)  Results for orders placed during the hospital encounter of 04/22/13 (from the past 24 hour(s))  URINALYSIS, ROUTINE W REFLEX MICROSCOPIC     Status: Abnormal   Collection Time    04/22/13 12:56 PM      Result Value Range   Color, Urine YELLOW  YELLOW   APPearance CLEAR  CLEAR   Specific Gravity, Urine >1.030 (*) 1.005 - 1.030   pH 6.0  5.0 - 8.0   Glucose, UA NEGATIVE  NEGATIVE mg/dL   Hgb urine dipstick MODERATE (*) NEGATIVE   Bilirubin Urine NEGATIVE  NEGATIVE   Ketones, ur NEGATIVE  NEGATIVE mg/dL   Protein, ur NEGATIVE  NEGATIVE mg/dL   Urobilinogen, UA 1.0  0.0 - 1.0 mg/dL   Nitrite POSITIVE (*) NEGATIVE   Leukocytes, UA NEGATIVE  NEGATIVE  PREGNANCY, URINE     Status: None   Collection Time    04/22/13 12:56 PM      Result Value Range   Preg Test, Ur NEGATIVE  NEGATIVE  URINE MICROSCOPIC-ADD ON     Status: Abnormal   Collection Time    04/22/13 12:56 PM      Result Value Range   Squamous Epithelial / LPF FEW (*) RARE   WBC, UA 0-2  <3 WBC/hpf   Bacteria, UA MANY (*) RARE    Assessment: 27 y.o. female with UTI  Plan:  Antibiotics   Follow up with PCP MDM  I have reviewed this patient's vital signs, nurses notes, appropriate labs and and discussed findings and plan of care with the patient and she voices understanding   Medication List    TAKE these medications        sulfamethoxazole-trimethoprim 800-160 MG per tablet  Commonly known as:  SEPTRA DS  Take 1 tablet by mouth 2 (two) times daily.      ASK your doctor about these medications       cyclobenzaprine 5 MG tablet  Commonly known as:  FLEXERIL  TAKE ONE TO TWO TABLETS BY MOUTH TWICE DAILY AS NEEDED FOR PAIN     ibuprofen 800 MG tablet  Commonly known  as:  ADVIL,MOTRIN  Take 1 tablet (800 mg total) by mouth every 8 (eight) hours as needed for pain.     LORazepam 0.5 MG tablet  Commonly known as:  ATIVAN  Take 1 tablet (0.5 mg total) by mouth daily as needed. Anxiety     meloxicam 15 MG tablet  Commonly known as:  MOBIC  Take 1 tablet (15 mg total) by mouth at bedtime.     oxyCODONE-acetaminophen 5-325 MG per tablet  Commonly known as:  PERCOCET/ROXICET  Take 1 tablet by mouth every 4 (four) hours as needed for pain.                  [place], [place] 04/22/13 [phone]

## 2013-04-22 NOTE — ED Notes (Signed)
Pt reports left sided flank pain since yesterday.  Has history of back pain but says pain is usually in her upper back.  Has been taking ibuprofen 800mg  and flexeril without relief.   Reports history of kidney stones.

## 2013-04-23 NOTE — ED Provider Notes (Signed)
Medical screening examination/treatment/procedure(s) were performed by non-physician practitioner and as supervising physician I was immediately available for consultation/collaboration.  Donnetta Hutching, MD 04/23/13 [phone]

## 2013-04-24 LAB — URINE CULTURE: Colony Count: >=100000

## 2013-04-25 ENCOUNTER — Telehealth (HOSPITAL_COMMUNITY): Payer: Self-pay | Admitting: Emergency Medicine

## 2013-04-25 NOTE — ED Notes (Signed)
+  Urine. Patient given Septra. No sensitivity listed. Chart sent to EDP office for review.

## 2013-04-25 NOTE — ED Notes (Signed)
Patient has +Urine culture. °

## 2013-04-27 NOTE — ED Notes (Signed)
Call pt if still having symptoms call in Cipro 500 # 14 1 po BID written by Ebbie Ridge.

## 2013-04-28 ENCOUNTER — Telehealth (HOSPITAL_COMMUNITY): Payer: Self-pay | Admitting: Emergency Medicine

## 2013-04-28 NOTE — ED Notes (Signed)
(+)   [facility] Chart reviewed by C. Lawyer PA "Call pt if still having symptoms Call in Cipro 500, # 14, 1 po BID, NR"  Pt informed.  Still having symptoms.  LVM  w/Rx info @ [facility] [phone].

## 2013-05-07 ENCOUNTER — Telehealth: Payer: Self-pay | Admitting: Family Medicine

## 2013-05-07 DIAGNOSIS — N390 Urinary tract infection, site not specified: Secondary | ICD-10-CM

## 2013-05-07 DIAGNOSIS — M549 Dorsalgia, unspecified: Secondary | ICD-10-CM

## 2013-05-07 MED ORDER — SULFAMETHOXAZOLE-TRIMETHOPRIM 800-160 MG PO TABS: 1.0000 | ORAL_TABLET | Freq: Two times a day (BID) | ORAL | Status: DC

## 2013-05-07 MED ORDER — MELOXICAM 15 MG PO TABS: 15.0000 mg | ORAL_TABLET | Freq: Every day | ORAL | Status: DC

## 2013-05-07 NOTE — Telephone Encounter (Signed)
Refilled Rx for pt that was lost at pharmacy.  Recent Urine culture with coag negative staph in urine, past MRSA UTI so bactrim is a good choice.   Lori Huerta 3:36 PM 05/07/2013

## 2013-05-07 NOTE — Telephone Encounter (Signed)
Pt states that the pharmacy [facility]) never rec'd the script for her Mobic.   Also she went to ED at [facility] and they had sent abx for a UTI and the pharmacy has not rec'd that either.  Wants to know if nurse can call in both.  pls let her know

## 2013-05-07 NOTE — Telephone Encounter (Signed)
Called pt. Left message to call back. See message please. Waiting for call back. Lorenda Hatchet, Renato Battles

## 2013-05-07 NOTE — Telephone Encounter (Signed)
Will forward to PCP for review

## 2013-08-11 ENCOUNTER — Emergency Department (HOSPITAL_COMMUNITY)
Admission: EM | Admit: 2013-08-11 | Discharge: 2013-08-11 | Disposition: A | Discharge status: Home / Self Care | Payer: Self-pay | Attending: Emergency Medicine | Admitting: Emergency Medicine

## 2013-08-11 ENCOUNTER — Encounter (HOSPITAL_COMMUNITY): Payer: Self-pay | Admitting: *Deleted

## 2013-08-11 DIAGNOSIS — Z87442 Personal history of urinary calculi: Secondary | ICD-10-CM | POA: Insufficient documentation

## 2013-08-11 DIAGNOSIS — Z8659 Personal history of other mental and behavioral disorders: Secondary | ICD-10-CM | POA: Insufficient documentation

## 2013-08-11 DIAGNOSIS — H6692 Otitis media, unspecified, left ear: Secondary | ICD-10-CM

## 2013-08-11 DIAGNOSIS — H6690 Otitis media, unspecified, unspecified ear: Secondary | ICD-10-CM | POA: Insufficient documentation

## 2013-08-11 DIAGNOSIS — J3489 Other specified disorders of nose and nasal sinuses: Secondary | ICD-10-CM | POA: Insufficient documentation

## 2013-08-11 MED ORDER — AMOXICILLIN-POT CLAVULANATE 875-125 MG PO TABS
1.0000 | ORAL_TABLET | Freq: Once | ORAL | Status: AC
  Administered 2013-08-11: 1 via ORAL
  Filled 2013-08-11: qty 1

## 2013-08-11 MED ORDER — AMOXICILLIN-POT CLAVULANATE 875-125 MG PO TABS: 1.0000 | ORAL_TABLET | Freq: Two times a day (BID) | ORAL | Status: DC

## 2013-08-11 NOTE — ED Provider Notes (Signed)
CSN: [id number]     Arrival date & time 08/11/13  0848 History  This chart was scribed for Donnetta Hutching, MD, by Yevette Edwards, ED Scribe. This patient was seen in room APA09/APA09 and the patient's care was started at 9:10 AM.   First MD Initiated Contact with Patient 08/11/13 0900     Chief Complaint  Patient presents with  . Facial Pain   (Consider location/radiation/quality/duration/timing/severity/associated sxs/prior Treatment) The history is provided by the patient. No language interpreter was used.   HPI Comments: Lori Huerta is a 27 y.o. female who presents to the Emergency Department complaining of gradually increasing pain to the left side of her face which began two weeks ago. She reports she has experienced sinus pressure, and the associated pain and pressure radiates to her left ear and her left gums. She rates her pain as 7/10. She denies experiencing any sore throat, neck pain, or swelling to her eyes. The pt originally used sudafed to alleviate her symptoms, but she reports that the sudafed stopped resolving the symptoms. She then  attempted to use Alka Selzer Severe Sinus, but it, too, stopped relieving the symptoms.  The pt denies smoking, and she uses alcohol occasionally.  Past Medical History  Diagnosis Date  . Bipolar 1 disorder   . Anxiety   . HYPERTENSION 12/07/2010    only with past pregnancy  . Kidney stones    Past Surgical History  Procedure Laterality Date  . Cesarean section    . Laparoscopy  02/04/2013    Procedure: LAPAROSCOPY OPERATIVE;  Surgeon: Allie Bossier, MD;  Location: [facility];  Service: Gynecology;  Laterality: N/A;  . Bilateral salpingectomy  02/04/2013    Procedure: BILATERAL SALPINGECTOMY;  Surgeon: Allie Bossier, MD;  Location: [facility];  Service: Gynecology;  Laterality: N/A;   History reviewed. No pertinent family history. History  Substance Use Topics  . Smoking status: Never Smoker   . Smokeless tobacco: Not on file  . Alcohol Use: Yes      Comment: occasionally    No OB history provided.  Review of Systems  Constitutional: Negative for fever.  HENT: Positive for ear pain and sinus pressure. Negative for sore throat and neck pain.   Eyes: Negative for pain.  All other systems reviewed and are negative.    Allergies  Review of patient's allergies indicates no known allergies.  Home Medications   Current Outpatient Rx  Name  Route  Sig  Dispense  Refill  . ibuprofen (ADVIL,MOTRIN) 800 MG tablet   Oral   Take 1 tablet (800 mg total) by mouth every 8 (eight) hours as needed for pain.   30 tablet   0   . oxyCODONE-acetaminophen (PERCOCET/ROXICET) 5-325 MG per tablet   Oral   Take 1 tablet by mouth every 4 (four) hours as needed for pain.   30 tablet   0   . sulfamethoxazole-trimethoprim (SEPTRA DS) 800-160 MG per tablet   Oral   Take 1 tablet by mouth 2 (two) times daily.   14 tablet   0    Triage Vitals: BP 124/86  Pulse 94  Temp(Src) 98.6 F (37 C) (Oral)  Resp 16  Ht 5\' 6"  (1.676 m)  Wt 135 lb (61.236 kg)  BMI 21.8 kg/m2  SpO2 100%  Physical Exam  Nursing note and vitals reviewed. Constitutional: She is oriented to person, place, and time. She appears well-developed and well-nourished. No distress.  HENT:  Head: Normocephalic and atraumatic.  Right  Ear: External ear normal.  Left Ear: External ear normal.  Nose: Nose normal.  Mouth/Throat: Oropharynx is clear and moist.  TM look normal.  Eyes: EOM are normal.  Neck: Neck supple. No tracheal deviation present.  Cardiovascular: Normal rate.   Pulmonary/Chest: Effort normal. No respiratory distress.  Musculoskeletal: Normal range of motion.  Neurological: She is alert and oriented to person, place, and time.  Skin: Skin is warm and dry.  Psychiatric: She has a normal mood and affect. Her behavior is normal.    ED Course   DIAGNOSTIC STUDIES:  Oxygen Saturation is 100% on room air, normal by my interpretation.    COORDINATION OF  CARE:  9:12 AM- Discussed treatment plan with patient which includes antibiotics, and the patient agreed to the plan.   Procedures (including critical care time)  Labs Reviewed - No data to display No results found. No diagnosis found.  MDM  History and physical consistent with left otitis media.  Rx Augmentin 875/125 twice a day for 10 days   I personally performed the services described in this documentation, which was scribed in my presence. The recorded information has been reviewed and is accurate.    Donnetta Hutching, MD 08/11/13 1028

## 2013-08-11 NOTE — ED Notes (Signed)
Pt c/o left side sinus, facial pain that started a week ago, had been using sudafed with improvement in symptoms but states that it is no longer working.

## 2013-08-11 NOTE — Progress Notes (Signed)
ED/CM noted pt did not have health insurance, and/or PCP. Patient was given the ED  [place] uninsured handout  with information for the clinics, food pantries, and the handout for insurance signup.  Patient expressed  appreciation for this. 

## 2013-08-25 ENCOUNTER — Emergency Department (HOSPITAL_COMMUNITY)
Admission: EM | Admit: 2013-08-25 | Discharge: 2013-08-25 | Disposition: A | Discharge status: Home / Self Care | Payer: Self-pay | Attending: Emergency Medicine | Admitting: Emergency Medicine

## 2013-08-25 ENCOUNTER — Encounter (HOSPITAL_COMMUNITY): Payer: Self-pay | Admitting: *Deleted

## 2013-08-25 DIAGNOSIS — H60399 Other infective otitis externa, unspecified ear: Secondary | ICD-10-CM | POA: Insufficient documentation

## 2013-08-25 DIAGNOSIS — H6092 Unspecified otitis externa, left ear: Secondary | ICD-10-CM

## 2013-08-25 DIAGNOSIS — Z87442 Personal history of urinary calculi: Secondary | ICD-10-CM | POA: Insufficient documentation

## 2013-08-25 DIAGNOSIS — Z8742 Personal history of other diseases of the female genital tract: Secondary | ICD-10-CM | POA: Insufficient documentation

## 2013-08-25 DIAGNOSIS — Z8659 Personal history of other mental and behavioral disorders: Secondary | ICD-10-CM | POA: Insufficient documentation

## 2013-08-25 MED ORDER — NEOMYCIN-POLYMYXIN-HC 1 % OT SOLN
OTIC | Status: AC
  Filled 2013-08-25: qty 10

## 2013-08-25 MED ORDER — NEOMYCIN-POLYMYXIN-HC 3.5-10000-1 OT SUSP
4.0000 [drp] | Freq: Four times a day (QID) | OTIC | Status: DC
  Administered 2013-08-25: 4 [drp] via OTIC
  Filled 2013-08-25: qty 10

## 2013-08-25 MED ORDER — NEOMYCIN-COLIST-HC-THONZONIUM 3.3-3-10-0.5 MG/ML OT SUSP: 4.0000 [drp] | Freq: Four times a day (QID) | OTIC | Status: DC

## 2013-08-25 NOTE — ED Notes (Signed)
C/o left side earache since 08/11/2013, was seen in er at that time, given medication, states that she became before right after she started taking the medicine but then earache became worse

## 2013-08-25 NOTE — ED Provider Notes (Signed)
CSN: [id number]     Arrival date & time 08/25/13  0802 History   First MD Initiated Contact with Patient 08/25/13 [phone]     Chief Complaint  Patient presents with  . Otalgia   (Consider location/radiation/quality/duration/timing/severity/associated sxs/prior Treatment) Patient is a 27 y.o. female presenting with ear pain. The history is provided by the patient.  Otalgia Location:  Left Severity:  Moderate Onset quality:  Gradual Duration:  10 days Timing:  Constant Progression:  Unchanged Relieved by:  Nothing Associated symptoms: no abdominal pain, no cough, no fever, no rash, no sore throat and no vomiting    Lori Huerta is a 27 y.o. female who presents to the ED with left ear pain. She was treated with Augmentin earlier this month for a sinus infection. She took all her medication but continues to have pain in the left ear with swelling of the ear canal. She got over the sinus infection and has no other problems.   Past Medical History  Diagnosis Date  . Bipolar 1 disorder   . Anxiety   . HYPERTENSION 12/07/2010    only with past pregnancy  . Kidney stones    Past Surgical History  Procedure Laterality Date  . Cesarean section    . Laparoscopy  02/04/2013    Procedure: LAPAROSCOPY OPERATIVE;  Surgeon: Allie Bossier, MD;  Location: [facility];  Service: Gynecology;  Laterality: N/A;  . Bilateral salpingectomy  02/04/2013    Procedure: BILATERAL SALPINGECTOMY;  Surgeon: Allie Bossier, MD;  Location: [facility];  Service: Gynecology;  Laterality: N/A;   No family history on file. History  Substance Use Topics  . Smoking status: Never Smoker   . Smokeless tobacco: Not on file  . Alcohol Use: Yes     Comment: occasionally   OB History   Grav Para Term Preterm Abortions TAB SAB Ect Mult Living                 Review of Systems  Constitutional: Negative for fever and chills.  HENT: Positive for ear pain. Negative for sore throat.   Eyes: Negative for redness and itching.   Respiratory: Negative for cough and shortness of breath.   Gastrointestinal: Negative for nausea, vomiting and abdominal pain.  Skin: Negative for rash.  Psychiatric/Behavioral: The patient is not nervous/anxious.     Allergies  Review of patient's allergies indicates no known allergies.  Home Medications   Current Outpatient Rx  Name  Route  Sig  Dispense  Refill  . ibuprofen (ADVIL,MOTRIN) 200 MG tablet   Oral   Take 400 mg by mouth every 8 (eight) hours as needed for pain.         Marland Kitchen amoxicillin-clavulanate (AUGMENTIN) 875-125 MG per tablet   Oral   Take 1 tablet by mouth 2 (two) times daily. One po bid x 7 days   14 tablet   0   . Phenylephrine-Aspirin (ALKA-SELTZER PLUS SINUS) 7.8-325 MG TBEF   Oral   Take 2 tablets by mouth every 4 (four) hours as needed (Sinus).          BP 97/74  Pulse 89  Temp(Src) 98.6 F (37 C) (Oral)  Resp 16  SpO2 99% Physical Exam  Nursing note and vitals reviewed. Constitutional: She is oriented to person, place, and time. She appears well-developed and well-nourished. No distress.  HENT:  Head: Normocephalic.  Right Ear: Tympanic membrane normal.  Left Ear: Tympanic membrane normal. There is swelling and tenderness.  Left  ear canal with swelling and tender with movement of external ear. No edema or erythema of external ear.   Eyes: EOM are normal.  Neck: Neck supple.  Cardiovascular: Normal rate and regular rhythm.   Pulmonary/Chest: Effort normal and breath sounds normal.  Abdominal: Soft. There is no tenderness.  Musculoskeletal: Normal range of motion.  Lymphadenopathy:    She has no cervical adenopathy.  Neurological: She is alert and oriented to person, place, and time. No cranial nerve deficit.  Skin: Skin is warm and dry.  Psychiatric: She has a normal mood and affect. Her behavior is normal.    ED Course  Procedures   MDM  27 y.o. female with external otitis. Will treat with Cortisporin Otic Susp. Patient to  follow up with Dr. Christain Sacramento if symptoms persist. First dose of ear drops instilled her in ED. Patient stable for discharge home without any immediate complications. Vital signs normal and O2 Sat 99% on R/A.  Discussed with the patient clinical findings and plan of care. All questioned fully answered.     Janne [place], [place] 08/25/13 1029

## 2013-08-25 NOTE — ED Provider Notes (Signed)
Medical screening examination/treatment/procedure(s) were performed by non-physician practitioner and as supervising physician I was immediately available for consultation/collaboration. Devoria Albe, MD, FACEP   Ward Givens, MD 08/25/13 1300

## 2013-08-25 NOTE — ED Notes (Signed)
Patient with no complaints at this time. Respirations even and unlabored. Skin warm/dry. Discharge instructions reviewed with patient at this time. Patient given opportunity to voice concerns/ask questions. Patient discharged at this time and left Emergency Department with steady gait.   

## 2013-08-25 NOTE — ED Notes (Signed)
Pain on L side of face and ear.  Finished 10 day course of Bactrim DS on 8/25.

## 2013-09-28 ENCOUNTER — Encounter (HOSPITAL_COMMUNITY): Payer: Self-pay | Admitting: *Deleted

## 2013-09-28 ENCOUNTER — Emergency Department (HOSPITAL_COMMUNITY)
Admission: EM | Admit: 2013-09-28 | Discharge: 2013-09-28 | Disposition: A | Discharge status: Home / Self Care | Payer: Self-pay | Attending: Emergency Medicine | Admitting: Emergency Medicine

## 2013-09-28 DIAGNOSIS — Z8659 Personal history of other mental and behavioral disorders: Secondary | ICD-10-CM | POA: Insufficient documentation

## 2013-09-28 DIAGNOSIS — H6092 Unspecified otitis externa, left ear: Secondary | ICD-10-CM

## 2013-09-28 DIAGNOSIS — H9202 Otalgia, left ear: Secondary | ICD-10-CM

## 2013-09-28 DIAGNOSIS — Z8679 Personal history of other diseases of the circulatory system: Secondary | ICD-10-CM | POA: Insufficient documentation

## 2013-09-28 DIAGNOSIS — H60509 Unspecified acute noninfective otitis externa, unspecified ear: Secondary | ICD-10-CM | POA: Insufficient documentation

## 2013-09-28 DIAGNOSIS — Z87442 Personal history of urinary calculi: Secondary | ICD-10-CM | POA: Insufficient documentation

## 2013-09-28 MED ORDER — HYDROCODONE-ACETAMINOPHEN 5-325 MG PO TABS: 1.0000 | ORAL_TABLET | ORAL | Status: DC | PRN

## 2013-09-28 MED ORDER — CIPROFLOXACIN-HYDROCORTISONE 0.2-1 % OT SUSP: 3.0000 [drp] | Freq: Two times a day (BID) | OTIC | Status: DC

## 2013-09-28 MED ORDER — ANTIPYRINE-BENZOCAINE 5.4-1.4 % OT SOLN
3.0000 [drp] | Freq: Once | OTIC | Status: AC
  Administered 2013-09-28: 4 [drp] via OTIC
  Filled 2013-09-28: qty 10

## 2013-09-28 NOTE — ED Notes (Signed)
Lt ear pain for 2 mos. No d/c   No fever

## 2013-09-28 NOTE — ED Provider Notes (Signed)
CSN: [id number]     Arrival date & time 09/28/13  1333 History   First MD Initiated Contact with Patient 09/28/13 1423     Chief Complaint  Patient presents with  . Otalgia   (Consider location/radiation/quality/duration/timing/severity/associated sxs/prior Treatment) Patient is a 27 y.o. female presenting with ear pain. The history is provided by the patient.  Otalgia Location:  Left Quality:  Aching Severity:  Moderate Onset quality:  Gradual Duration:  2 months Timing:  Constant Progression:  Worsening Associated symptoms: no cough, no fever, no rash, no sore throat and no vomiting    Lori Huerta is a 27 y.o. female who presents to the ED with left ear pain. She was evaluated here a couple months ago and treated for a sinus infection with Augmentin. She got a little better for a while and then the pain returned. She returned to the ED and was treated with Cortisporin Otic drops for External Otitis. She used the drops and they seemed to help for a while but now the pain is worse. She was unable to sleep last night due to the pain and had to leave work early today.  She denies any other problems. She was referred to ENT on her last visit but has no insurance and was not able to see him. She has an appointment with the health department next month.   Past Medical History  Diagnosis Date  . Bipolar 1 disorder   . Anxiety   . HYPERTENSION 12/07/2010    only with past pregnancy  . Kidney stones    Past Surgical History  Procedure Laterality Date  . Cesarean section    . Laparoscopy  02/04/2013    Procedure: LAPAROSCOPY OPERATIVE;  Surgeon: Allie Bossier, MD;  Location: [facility];  Service: Gynecology;  Laterality: N/A;  . Bilateral salpingectomy  02/04/2013    Procedure: BILATERAL SALPINGECTOMY;  Surgeon: Allie Bossier, MD;  Location: [facility];  Service: Gynecology;  Laterality: N/A;   History reviewed. No pertinent family history. History  Substance Use Topics  . Smoking status: Never  Smoker   . Smokeless tobacco: Not on file  . Alcohol Use: Yes     Comment: occasionally   OB History   Grav Para Term Preterm Abortions TAB SAB Ect Mult Living                 Review of Systems  Constitutional: Negative for fever and chills.  HENT: Positive for ear pain. Negative for sore throat.   Respiratory: Negative for cough and shortness of breath.   Gastrointestinal: Negative for nausea and vomiting.  Skin: Negative for rash.  Allergic/Immunologic: Negative for immunocompromised state.  Neurological: Negative for dizziness and syncope.  Psychiatric/Behavioral: The patient is not nervous/anxious.     Allergies  Review of patient's allergies indicates no known allergies.  Home Medications  No current outpatient prescriptions on file. BP 117/82  Pulse 86  Temp(Src) 98.8 F (37.1 C) (Oral)  Resp 17  Ht 5\' 6"  (1.676 m)  Wt 135 lb (61.236 kg)  BMI 21.8 kg/m2  SpO2 99%  LMP 09/01/2013 Physical Exam  Nursing note and vitals reviewed. Constitutional: She is oriented to person, place, and time. She appears well-developed and well-nourished. No distress.  HENT:  Head: Normocephalic and atraumatic.  Right Ear: Tympanic membrane normal.  Left Ear: Tympanic membrane normal.  Nose: Nose normal.  Minimal swelling of the canal of the left ear. TM normal.  Eyes: EOM are normal.  Neck:  Neck supple.  Cardiovascular: Normal rate.   Pulmonary/Chest: Effort normal.  Musculoskeletal: Normal range of motion.  Neurological: She is alert and oriented to person, place, and time. No cranial nerve deficit.  Skin: Skin is warm and dry.  Psychiatric: She has a normal mood and affect. Her behavior is normal.    ED Course: Dr. Fayrene Fearing in to examine the patient. Will try Cipro Otic drops  Procedures MDM  27 y.o. female with chronic left ear pain. She has been unable to follow up with ENT due to no insurance and no money. Discussed with the patient that she has had antibiotics PO, ear  drops for otitis externa and will still need to see ENT as soon as possible. Patient states she has follow up arranged with the Health department next week. Will treat for pain today.    Medication List         ciprofloxacin-hydrocortisone otic suspension  Commonly known as:  CIPRO HC  Place 3 drops into the left ear 2 (two) times daily.     HYDROcodone-acetaminophen 5-325 MG per tablet  Commonly known as:  NORCO/VICODIN  Take 1 tablet by mouth every 4 (four) hours as needed.           [facility] Orlene Och, [place] 09/29/13 [phone]

## 2013-10-03 NOTE — ED Provider Notes (Signed)
Medical screening examination/treatment/procedure(s) were performed by non-physician practitioner and as supervising physician I was immediately available for consultation/collaboration.   Roney Marion, MD 10/03/13 [phone]

## 2013-11-05 ENCOUNTER — Other Ambulatory Visit: Payer: Self-pay

## 2013-11-24 ENCOUNTER — Encounter (HOSPITAL_COMMUNITY): Payer: Self-pay | Admitting: Emergency Medicine

## 2013-11-24 ENCOUNTER — Emergency Department (HOSPITAL_COMMUNITY): Payer: Self-pay

## 2013-11-24 ENCOUNTER — Emergency Department (HOSPITAL_COMMUNITY)
Admission: EM | Admit: 2013-11-24 | Discharge: 2013-11-24 | Disposition: A | Discharge status: Home / Self Care | Payer: Self-pay | Attending: Emergency Medicine | Admitting: Emergency Medicine

## 2013-11-24 DIAGNOSIS — Z87442 Personal history of urinary calculi: Secondary | ICD-10-CM | POA: Insufficient documentation

## 2013-11-24 DIAGNOSIS — IMO0001 Reserved for inherently not codable concepts without codable children: Secondary | ICD-10-CM | POA: Insufficient documentation

## 2013-11-24 DIAGNOSIS — J069 Acute upper respiratory infection, unspecified: Secondary | ICD-10-CM | POA: Insufficient documentation

## 2013-11-24 DIAGNOSIS — J329 Chronic sinusitis, unspecified: Secondary | ICD-10-CM | POA: Insufficient documentation

## 2013-11-24 DIAGNOSIS — Z792 Long term (current) use of antibiotics: Secondary | ICD-10-CM | POA: Insufficient documentation

## 2013-11-24 DIAGNOSIS — IMO0002 Reserved for concepts with insufficient information to code with codable children: Secondary | ICD-10-CM | POA: Insufficient documentation

## 2013-11-24 DIAGNOSIS — Z8659 Personal history of other mental and behavioral disorders: Secondary | ICD-10-CM | POA: Insufficient documentation

## 2013-11-24 MED ORDER — PREDNISONE 50 MG PO TABS
60.0000 mg | ORAL_TABLET | Freq: Once | ORAL | Status: AC
  Administered 2013-11-24: 60 mg via ORAL
  Filled 2013-11-24 (×2): qty 1

## 2013-11-24 MED ORDER — HYDROCOD POLST-CHLORPHEN POLST 10-8 MG/5ML PO LQCR
5.0000 mL | Freq: Once | ORAL | Status: AC
  Administered 2013-11-24: 5 mL via ORAL
  Filled 2013-11-24: qty 5

## 2013-11-24 MED ORDER — PHENYLEPH-PROMETHAZINE-COD 5-6.25-10 MG/5ML PO SYRP: ORAL_SOLUTION | ORAL | Status: DC

## 2013-11-24 MED ORDER — PSEUDOEPHEDRINE HCL ER 120 MG PO TB12: 120.0000 mg | ORAL_TABLET | Freq: Two times a day (BID) | ORAL | Status: DC

## 2013-11-24 MED ORDER — PREDNISONE 10 MG PO TABS: ORAL_TABLET | ORAL | Status: DC

## 2013-11-24 NOTE — ED Notes (Signed)
Pt c/o dry cough x 2 weeks.  Denies fever.

## 2013-11-24 NOTE — ED Provider Notes (Signed)
CSN: [id number]     Arrival date & time 11/24/13  1827 History   First MD Initiated Contact with Patient 11/24/13 1909     Chief Complaint  Patient presents with  . Cough   (Consider location/radiation/quality/duration/timing/severity/associated sxs/prior Treatment) Patient is a 27 y.o. female presenting with cough. The history is provided by the patient.  Cough Cough characteristics:  Non-productive and harsh Severity:  Moderate Onset quality:  Gradual Duration:  2 weeks Timing:  Intermittent Progression:  Worsening Chronicity:  New Smoker: no   Context: upper respiratory infection and weather changes   Relieved by:  Nothing Worsened by:  Nothing tried Ineffective treatments: otc cough medications. Associated symptoms: myalgias and sinus congestion   Associated symptoms: no chest pain, no eye discharge, no fever, no shortness of breath and no wheezing     Past Medical History  Diagnosis Date  . Bipolar 1 disorder   . Anxiety   . HYPERTENSION 12/07/2010    only with past pregnancy  . Kidney stones    Past Surgical History  Procedure Laterality Date  . Cesarean section    . Laparoscopy  02/04/2013    Procedure: LAPAROSCOPY OPERATIVE;  Surgeon: Allie Bossier, MD;  Location: [facility];  Service: Gynecology;  Laterality: N/A;  . Bilateral salpingectomy  02/04/2013    Procedure: BILATERAL SALPINGECTOMY;  Surgeon: Allie Bossier, MD;  Location: [facility];  Service: Gynecology;  Laterality: N/A;   No family history on file. History  Substance Use Topics  . Smoking status: Never Smoker   . Smokeless tobacco: Not on file  . Alcohol Use: Yes     Comment: occasionally   OB History   Grav Para Term Preterm Abortions TAB SAB Ect Mult Living                 Review of Systems  Constitutional: Negative for fever and activity change.       All ROS Neg except as noted in HPI  HENT: Negative for nosebleeds.   Eyes: Negative for photophobia and discharge.  Respiratory: Positive for cough.  Negative for shortness of breath and wheezing.   Cardiovascular: Negative for chest pain and palpitations.  Gastrointestinal: Negative for abdominal pain and blood in stool.  Genitourinary: Negative for dysuria, frequency and hematuria.  Musculoskeletal: Positive for myalgias. Negative for arthralgias, back pain and neck pain.  Skin: Negative.   Neurological: Negative for dizziness, seizures and speech difficulty.  Psychiatric/Behavioral: Negative for hallucinations and confusion.    Allergies  Review of patient's allergies indicates no known allergies.  Home Medications   Current Outpatient Rx  Name  Route  Sig  Dispense  Refill  . ciprofloxacin-hydrocortisone (CIPRO HC) otic suspension   Left Ear   Place 3 drops into the left ear 2 (two) times daily.   10 mL   0   . HYDROcodone-acetaminophen (NORCO/VICODIN) 5-325 MG per tablet   Oral   Take 1 tablet by mouth every 4 (four) hours as needed.   15 tablet   0    BP 123/82  Pulse 104  Temp(Src) 98.4 F (36.9 C) (Oral)  Resp 20  Ht 5\' 6"  (1.676 m)  Wt 140 lb (63.504 kg)  BMI 22.61 kg/m2  SpO2 100%  LMP 11/16/2013 Physical Exam  Nursing note and vitals reviewed. Constitutional: She is oriented to person, place, and time. She appears well-developed and well-nourished.  Non-toxic appearance.  HENT:  Head: Normocephalic.  Right Ear: Tympanic membrane and external ear normal.  Left  Ear: Tympanic membrane and external ear normal.  Nasal congestion  Eyes: EOM and lids are normal. Pupils are equal, round, and reactive to light.  Neck: Normal range of motion. Neck supple. Carotid bruit is not present.  Cardiovascular: Normal rate, regular rhythm, normal heart sounds, intact distal pulses and normal pulses.   Pulmonary/Chest: Breath sounds normal. No respiratory distress.  Course breath sounds. Mild to mod chest wall soreness.  Abdominal: Soft. Bowel sounds are normal. There is no tenderness. There is no guarding.   Musculoskeletal: Normal range of motion.  Lymphadenopathy:       Head (right side): No submandibular adenopathy present.       Head (left side): No submandibular adenopathy present.    She has no cervical adenopathy.  Neurological: She is alert and oriented to person, place, and time. She has normal strength. No cranial nerve deficit or sensory deficit.  Skin: Skin is warm and dry.  Psychiatric: She has a normal mood and affect. Her speech is normal.    ED Course  Procedures (including critical care time) Labs Review Labs Reviewed - No data to display Imaging Review Dg Chest 2 View  11/24/2013   CLINICAL DATA:  Cough  EXAM: CHEST  2 VIEW  COMPARISON:  July 18, 2012  FINDINGS: The heart size and mediastinal contours are within normal limits. Both lungs are clear. The visualized skeletal structures are stable. There is scoliosis of spine.  IMPRESSION: No active cardiopulmonary disease.   Electronically Signed   By: Sherian Rein M.D.   On: 11/24/2013 19:03    EKG Interpretation   None       MDM  No diagnosis found. **I have reviewed nursing notes, vital signs, and all appropriate lab and imaging results for this patient.*  Chest xray is negative for pneumonia. Vital signs stable. Pt given rx for sudafed, prednisone, and promethazine cough medication.  Kathie Dike, PA-C 11/24/13 [phone]

## 2013-11-24 NOTE — ED Provider Notes (Signed)
Medical screening examination/treatment/procedure(s) were performed by non-physician practitioner and as supervising physician I was immediately available for consultation/collaboration.  EKG Interpretation   None        Donnetta Hutching, MD 11/24/13 2041

## 2014-01-12 ENCOUNTER — Emergency Department (HOSPITAL_COMMUNITY)
Admission: EM | Admit: 2014-01-12 | Discharge: 2014-01-12 | Disposition: A | Discharge status: Home / Self Care | Payer: Self-pay | Attending: Emergency Medicine | Admitting: Emergency Medicine

## 2014-01-12 ENCOUNTER — Encounter (HOSPITAL_COMMUNITY): Payer: Self-pay | Admitting: Emergency Medicine

## 2014-01-12 DIAGNOSIS — Z9889 Other specified postprocedural states: Secondary | ICD-10-CM | POA: Insufficient documentation

## 2014-01-12 DIAGNOSIS — I10 Essential (primary) hypertension: Secondary | ICD-10-CM | POA: Insufficient documentation

## 2014-01-12 DIAGNOSIS — Z8614 Personal history of Methicillin resistant Staphylococcus aureus infection: Secondary | ICD-10-CM | POA: Insufficient documentation

## 2014-01-12 DIAGNOSIS — R109 Unspecified abdominal pain: Secondary | ICD-10-CM | POA: Insufficient documentation

## 2014-01-12 DIAGNOSIS — Z87442 Personal history of urinary calculi: Secondary | ICD-10-CM | POA: Insufficient documentation

## 2014-01-12 DIAGNOSIS — IMO0002 Reserved for concepts with insufficient information to code with codable children: Secondary | ICD-10-CM | POA: Insufficient documentation

## 2014-01-12 DIAGNOSIS — Z3202 Encounter for pregnancy test, result negative: Secondary | ICD-10-CM | POA: Insufficient documentation

## 2014-01-12 DIAGNOSIS — Z792 Long term (current) use of antibiotics: Secondary | ICD-10-CM | POA: Insufficient documentation

## 2014-01-12 DIAGNOSIS — Z8659 Personal history of other mental and behavioral disorders: Secondary | ICD-10-CM | POA: Insufficient documentation

## 2014-01-12 DIAGNOSIS — Z79899 Other long term (current) drug therapy: Secondary | ICD-10-CM | POA: Insufficient documentation

## 2014-01-12 DIAGNOSIS — N390 Urinary tract infection, site not specified: Secondary | ICD-10-CM | POA: Insufficient documentation

## 2014-01-12 LAB — PREGNANCY, URINE: PREG TEST UR: NEGATIVE

## 2014-01-12 LAB — URINALYSIS, ROUTINE W REFLEX MICROSCOPIC
BILIRUBIN URINE: NEGATIVE
Glucose, UA: NEGATIVE mg/dL
Ketones, ur: NEGATIVE mg/dL
NITRITE: POSITIVE — AB
PH: 6 (ref 5.0–8.0)
Protein, ur: 30 mg/dL — AB
Urobilinogen, UA: 1 mg/dL (ref 0.0–1.0)

## 2014-01-12 LAB — URINE MICROSCOPIC-ADD ON

## 2014-01-12 MED ORDER — SULFAMETHOXAZOLE-TMP DS 800-160 MG PO TABS: 1.0000 | ORAL_TABLET | Freq: Two times a day (BID) | ORAL | Status: DC

## 2014-01-12 MED ORDER — SULFAMETHOXAZOLE-TMP DS 800-160 MG PO TABS
1.0000 | ORAL_TABLET | Freq: Once | ORAL | Status: AC
  Administered 2014-01-12: 1 via ORAL
  Filled 2014-01-12: qty 1

## 2014-01-12 NOTE — ED Provider Notes (Signed)
CSN: [id number]     Arrival date & time 01/12/14  1324 History  This chart was scribed for Donnetta Hutching, MD, by Yevette Edwards, ED Scribe. This patient was seen in room APA06/[facility]06 and the patient's care was started at 8:56 AM.   First MD Initiated Contact with Patient 01/12/14 0809     Chief Complaint  Patient presents with  . Abdominal Pain  . Dysuria    The history is provided by the patient. No language interpreter was used.    HPI Comments: Lori Huerta is a 28 y.o. female, with a h/o renal calculi and UTI, who presents to the Emergency Department complaining of non-radiating left-sided flank pain, which has been accompanied by intermittent dysuria, and which has been present for two days. She denies any vaginal bleeding or vaginal discharge. She reports she experienced MRSA with her last episode of UTI. Her LMNP was two weeks ago. The pt is sexually active; she does not always use condoms; she has a h/o bilateral salpingectomy. The pt is a non-smoker.   Past Medical History  Diagnosis Date  . Bipolar 1 disorder   . Anxiety   . HYPERTENSION 12/07/2010    only with past pregnancy  . Kidney stones    Past Surgical History  Procedure Laterality Date  . Cesarean section    . Laparoscopy  02/04/2013    Procedure: LAPAROSCOPY OPERATIVE;  Surgeon: Allie Bossier, MD;  Location: [facility];  Service: Gynecology;  Laterality: N/A;  . Bilateral salpingectomy  02/04/2013    Procedure: BILATERAL SALPINGECTOMY;  Surgeon: Allie Bossier, MD;  Location: [facility];  Service: Gynecology;  Laterality: N/A;   No family history on file. History  Substance Use Topics  . Smoking status: Never Smoker   . Smokeless tobacco: Not on file  . Alcohol Use: Yes     Comment: occasionally   No OB history provided.  Review of Systems  Genitourinary: Positive for dysuria and flank pain. Negative for vaginal bleeding and vaginal discharge.  All other systems reviewed and are negative.   Allergies  Review of  patient's allergies indicates no known allergies.  Home Medications   Current Outpatient Rx  Name  Route  Sig  Dispense  Refill  . ciprofloxacin-hydrocortisone (CIPRO HC) otic suspension   Left Ear   Place 3 drops into the left ear 2 (two) times daily.   10 mL   0   . HYDROcodone-acetaminophen (NORCO/VICODIN) 5-325 MG per tablet   Oral   Take 1 tablet by mouth every 4 (four) hours as needed.   15 tablet   0   . Phenyleph-Promethazine-Cod 5-6.25-10 MG/5ML SYRP      5ml po q6h prn cough/congestion   150 mL   0   . predniSONE (DELTASONE) 10 MG tablet      5,4,3,2,1 - take with food   15 tablet   0   . pseudoephedrine (SUDAFED 12 HOUR) 120 MG 12 hr tablet   Oral   Take 1 tablet (120 mg total) by mouth 2 (two) times daily.   20 tablet   0    Triage Vitals: BP 102/75  Pulse 86  Temp(Src) 98.1 F (36.7 C)  Resp 16  SpO2 100%  LMP 12/31/2013  Physical Exam  Nursing note and vitals reviewed. Constitutional: She is oriented to person, place, and time. She appears well-developed and well-nourished.  HENT:  Head: Normocephalic and atraumatic.  Eyes: Conjunctivae and EOM are normal. Pupils are equal, round, and  reactive to light.  Neck: Normal range of motion. Neck supple.  Cardiovascular: Normal rate, regular rhythm and normal heart sounds.   Pulmonary/Chest: Effort normal and breath sounds normal.  Abdominal: Soft. Bowel sounds are normal.  No suprapubic tenderness.   Genitourinary:  Bacteria in urine, too numerous to count.    Musculoskeletal: Normal range of motion. She exhibits tenderness.  Minimal left flank pain.   Neurological: She is alert and oriented to person, place, and time.  Skin: Skin is warm and dry.  Psychiatric: She has a normal mood and affect. Her behavior is normal.    ED Course  Procedures (including critical care time)  DIAGNOSTIC STUDIES: Oxygen Saturation is 100% on room air, normal by my interpretation.    COORDINATION OF  CARE:  9:02 AM- Discussed treatment plan with patient, which includes Bacitram, and the patient agreed to the plan.   Labs Review Labs Reviewed  URINALYSIS, ROUTINE W REFLEX MICROSCOPIC - Abnormal; Notable for the following:    Specific Gravity, Urine >1.030 (*)    Hgb urine dipstick LARGE (*)    Protein, ur 30 (*)    Nitrite POSITIVE (*)    Leukocytes, UA SMALL (*)    All other components within normal limits  URINE MICROSCOPIC-ADD ON - Abnormal; Notable for the following:    Bacteria, UA MANY (*)    All other components within normal limits  URINE CULTURE  PREGNANCY, URINE   Imaging Review No results found.  EKG Interpretation   None       MDM  No diagnosis found. History and physical consistent with minor urinary tract infection. Urinalysis confirms same. Rx Septra DS twice a day for one week.  Patient is hemodynamically stable  I personally performed the services described in this documentation, which was scribed in my presence. The recorded information has been reviewed and is accurate.    Donnetta HutchingBrian Lelania Bia, MD 01/12/14 1011

## 2014-01-12 NOTE — ED Notes (Signed)
Pt c/o left side pain and burning with urination x 2 days.  Denies any vaginal bleeding or discharge.

## 2014-01-12 NOTE — Discharge Instructions (Signed)
You have a urinary tract infection. Increase fluids. Antibiotic twice a day.

## 2014-01-14 LAB — URINE CULTURE

## 2014-01-17 ENCOUNTER — Telehealth (HOSPITAL_COMMUNITY): Payer: Self-pay | Admitting: Emergency Medicine

## 2014-01-17 NOTE — ED Notes (Signed)
Post ED Visit - Positive Culture Follow-up  Culture report reviewed by antimicrobial stewardship pharmacist: [x]  Wes Dulaney, Pharm.D., BCPS []  Celedonio MiyamotoJeremy Frens, Pharm.D., BCPS []  Georgina PillionElizabeth Martin, Pharm.D., BCPS []  TaylorMinh Pham, Vermont[place]arm.D., BCPS, AAHIVP []  Estella HuskMichelle Turner, Pharm.D., BCPS, AAHIVP  Positive urine culture Treated with Sulfa-Trimeth, organism sensitive to the same and no further patient follow-up is required at this time.  Zeb ComfortHolland, Marquite Attwood 01/17/2014, 4:29 PM

## 2014-02-26 ENCOUNTER — Encounter (HOSPITAL_COMMUNITY): Payer: Self-pay | Admitting: Emergency Medicine

## 2014-02-26 ENCOUNTER — Emergency Department (HOSPITAL_COMMUNITY)
Admission: EM | Admit: 2014-02-26 | Discharge: 2014-02-26 | Disposition: A | Discharge status: Home / Self Care | Payer: Self-pay | Attending: Emergency Medicine | Admitting: Emergency Medicine

## 2014-02-26 DIAGNOSIS — R5381 Other malaise: Secondary | ICD-10-CM | POA: Insufficient documentation

## 2014-02-26 DIAGNOSIS — R5383 Other fatigue: Secondary | ICD-10-CM

## 2014-02-26 DIAGNOSIS — Z8659 Personal history of other mental and behavioral disorders: Secondary | ICD-10-CM | POA: Insufficient documentation

## 2014-02-26 DIAGNOSIS — R112 Nausea with vomiting, unspecified: Secondary | ICD-10-CM | POA: Insufficient documentation

## 2014-02-26 DIAGNOSIS — I10 Essential (primary) hypertension: Secondary | ICD-10-CM | POA: Insufficient documentation

## 2014-02-26 DIAGNOSIS — Z87442 Personal history of urinary calculi: Secondary | ICD-10-CM | POA: Insufficient documentation

## 2014-02-26 LAB — URINALYSIS, ROUTINE W REFLEX MICROSCOPIC
BILIRUBIN URINE: NEGATIVE
Glucose, UA: NEGATIVE mg/dL
KETONES UR: NEGATIVE mg/dL
Leukocytes, UA: NEGATIVE
NITRITE: POSITIVE — AB
Protein, ur: NEGATIVE mg/dL
Specific Gravity, Urine: >1.03 — ABNORMAL HIGH (ref 1.005–1.030)
UROBILINOGEN UA: 0.2 mg/dL (ref 0.0–1.0)
pH: 6 (ref 5.0–8.0)

## 2014-02-26 LAB — CBC WITH DIFFERENTIAL/PLATELET
Basophils Absolute: 0 10*3/uL (ref 0.0–0.1)
Basophils Relative: 0 % (ref 0–1)
EOS ABS: 0 10*3/uL (ref 0.0–0.7)
Eosinophils Relative: 0 % (ref 0–5)
HCT: 42.5 % (ref 36.0–46.0)
HEMOGLOBIN: 15 g/dL (ref 12.0–15.0)
LYMPHS ABS: 0.4 10*3/uL — AB (ref 0.7–4.0)
LYMPHS PCT: 6 % — AB (ref 12–46)
MCH: 31.9 pg (ref 26.0–34.0)
MCHC: 35.3 g/dL (ref 30.0–36.0)
MCV: 90.4 fL (ref 78.0–100.0)
MONOS PCT: 5 % (ref 3–12)
Monocytes Absolute: 0.3 10*3/uL (ref 0.1–1.0)
NEUTROS PCT: 89 % — AB (ref 43–77)
Neutro Abs: 6.5 10*3/uL (ref 1.7–7.7)
PLATELETS: 299 10*3/uL (ref 150–400)
RBC: 4.7 MIL/uL (ref 3.87–5.11)
RDW: 12.2 % (ref 11.5–15.5)
WBC: 7.3 10*3/uL (ref 4.0–10.5)

## 2014-02-26 LAB — COMPREHENSIVE METABOLIC PANEL
ALK PHOS: 76 U/L (ref 39–117)
ALT: 10 U/L (ref 0–35)
AST: 12 U/L (ref 0–37)
Albumin: 4.3 g/dL (ref 3.5–5.2)
BILIRUBIN TOTAL: 0.7 mg/dL (ref 0.3–1.2)
BUN: 14 mg/dL (ref 6–23)
CHLORIDE: 102 meq/L (ref 96–112)
CO2: 26 meq/L (ref 19–32)
Calcium: 9.4 mg/dL (ref 8.4–10.5)
Creatinine, Ser: 0.72 mg/dL (ref 0.50–1.10)
GLUCOSE: 89 mg/dL (ref 70–99)
POTASSIUM: 3.7 meq/L (ref 3.7–5.3)
SODIUM: 140 meq/L (ref 137–147)
TOTAL PROTEIN: 8.7 g/dL — AB (ref 6.0–8.3)

## 2014-02-26 LAB — URINE MICROSCOPIC-ADD ON

## 2014-02-26 LAB — LIPASE, BLOOD: LIPASE: 30 U/L (ref 11–59)

## 2014-02-26 MED ORDER — ONDANSETRON 4 MG PO TBDP
4.0000 mg | ORAL_TABLET | Freq: Once | ORAL | Status: AC
  Administered 2014-02-26: 4 mg via ORAL
  Filled 2014-02-26: qty 1

## 2014-02-26 MED ORDER — ONDANSETRON HCL 4 MG/2ML IJ SOLN: 4.0000 mg | Freq: Once | INTRAMUSCULAR | Status: DC

## 2014-02-26 MED ORDER — ONDANSETRON HCL 4 MG PO TABS: 4.0000 mg | ORAL_TABLET | Freq: Three times a day (TID) | ORAL | Status: DC | PRN

## 2014-02-26 NOTE — ED Notes (Signed)
Pt states started vomiting this am when she tries to eat/drink. Denies diarrhea. C/o soreness to chest and back from vomiting. Nad. Mm wet.

## 2014-02-26 NOTE — Discharge Instructions (Signed)
Drink plenty of fluids. Use the zofran for nausea or vomiting. Return if you feel worse again.    Nausea and Vomiting Nausea is a sick feeling that often comes before throwing up (vomiting). Vomiting is a reflex where stomach contents come out of your mouth. Vomiting can cause severe loss of body fluids (dehydration). Children and elderly adults can become dehydrated quickly, especially if they also have diarrhea. Nausea and vomiting are symptoms of a condition or disease. It is important to find the cause of your symptoms. CAUSES   Direct irritation of the stomach lining. This irritation can result from increased acid production (gastroesophageal reflux disease), infection, food poisoning, taking certain medicines (such as nonsteroidal anti-inflammatory drugs), alcohol use, or tobacco use.  Signals from the brain.These signals could be caused by a headache, heat exposure, an inner ear disturbance, increased pressure in the brain from injury, infection, a tumor, or a concussion, pain, emotional stimulus, or metabolic problems.  An obstruction in the gastrointestinal tract (bowel obstruction).  Illnesses such as diabetes, hepatitis, gallbladder problems, appendicitis, kidney problems, cancer, sepsis, atypical symptoms of a heart attack, or eating disorders.  Medical treatments such as chemotherapy and radiation.  Receiving medicine that makes you sleep (general anesthetic) during surgery. DIAGNOSIS Your caregiver may ask for tests to be done if the problems do not improve after a few days. Tests may also be done if symptoms are severe or if the reason for the nausea and vomiting is not clear. Tests may include:  Urine tests.  Blood tests.  Stool tests.  Cultures (to look for evidence of infection).  X-rays or other imaging studies. Test results can help your caregiver make decisions about treatment or the need for additional tests. TREATMENT You need to stay well hydrated. Drink  frequently but in small amounts.You may wish to drink water, sports drinks, clear broth, or eat frozen ice pops or gelatin dessert to help stay hydrated.When you eat, eating slowly may help prevent nausea.There are also some antinausea medicines that may help prevent nausea. HOME CARE INSTRUCTIONS   Take all medicine as directed by your caregiver.  If you do not have an appetite, do not force yourself to eat. However, you must continue to drink fluids.  If you have an appetite, eat a normal diet unless your caregiver tells you differently.  Eat a variety of complex carbohydrates (rice, wheat, potatoes, bread), lean meats, yogurt, fruits, and vegetables.  Avoid high-fat foods because they are more difficult to digest.  Drink enough water and fluids to keep your urine clear or pale yellow.  If you are dehydrated, ask your caregiver for specific rehydration instructions. Signs of dehydration may include:  Severe thirst.  Dry lips and mouth.  Dizziness.  Dark urine.  Decreasing urine frequency and amount.  Confusion.  Rapid breathing or pulse. SEEK IMMEDIATE MEDICAL CARE IF:   You have blood or brown flecks (like coffee grounds) in your vomit.  You have black or bloody stools.  You have a severe headache or stiff neck.  You are confused.  You have severe abdominal pain.  You have chest pain or trouble breathing.  You do not urinate at least once every 8 hours.  You develop cold or clammy skin.  You continue to vomit for longer than 24 to 48 hours.  You have a fever. MAKE SURE YOU:   Understand these instructions.  Will watch your condition.  Will get help right away if you are not doing well or get worse.  Document Released: 12/17/2005 Document Revised: 03/10/2012 Document Reviewed: 05/16/2011 [facility] Patient Information 2014 [place], [place].

## 2014-02-26 NOTE — ED Provider Notes (Signed)
CSN: [id number]     Arrival date & time 02/26/14  1146 History   First MD Initiated Contact with Patient 02/26/14 1303     Chief Complaint  Patient presents with  . Emesis     (Consider location/radiation/quality/duration/timing/severity/associated sxs/prior Treatment) HPI Patient reports she felt weak all day yesterday. She reports this morning about 6 AM she started vomiting and has vomited about 10 times. She denies diarrhea or abdominal pain. She denies any fever. She states she has diffuse abdominal soreness only when she has the vomiting. Otherwise she has no pain. She states she does feel weak. She denies feeling lightheaded or dizzy. She denies having chills. She denies being around anybody else is ill. She denies eating something that could make her feel bad.  PCP Dr Ermalinda Memos [facility]  Past Medical History  Diagnosis Date  . Bipolar 1 disorder   . Anxiety   . HYPERTENSION 12/07/2010    only with past pregnancy  . Kidney stones    Past Surgical History  Procedure Laterality Date  . Cesarean section    . Laparoscopy  02/04/2013    Procedure: LAPAROSCOPY OPERATIVE;  Surgeon: Allie Bossier, MD;  Location: [facility];  Service: Gynecology;  Laterality: N/A;  . Bilateral salpingectomy  02/04/2013    Procedure: BILATERAL SALPINGECTOMY;  Surgeon: Allie Bossier, MD;  Location: [facility];  Service: Gynecology;  Laterality: N/A;   History reviewed. No pertinent family history. History  Substance Use Topics  . Smoking status: Never Smoker   . Smokeless tobacco: Not on file  . Alcohol Use: Yes     Comment: occasionally   Employed as Leisure centre manager and waitress  OB History   Grav Para Term Preterm Abortions TAB SAB Ect Mult Living                 Review of Systems  All other systems reviewed and are negative.      Allergies  Review of patient's allergies indicates no known allergies.  Home Medications  None  BP 117/79  Pulse 105  Temp(Src) 99.1 F (37.3 C) (Oral)  Resp 19  Ht 5'  6" (1.676 m)  Wt 135 lb (61.236 kg)  BMI 21.80 kg/m2  SpO2 98%  LMP 12/31/2013  Vital signs normal except tachycardia  Physical Exam  Nursing note and vitals reviewed. Constitutional: She is oriented to person, place, and time. She appears well-developed and well-nourished.  Non-toxic appearance. She does not appear ill. No distress.  HENT:  Head: Normocephalic and atraumatic.  Right Ear: External ear normal.  Left Ear: External ear normal.  Nose: Nose normal. No mucosal edema or rhinorrhea.  Mouth/Throat: Oropharynx is clear and moist and mucous membranes are normal. No dental abscesses or uvula swelling.  Eyes: Conjunctivae and EOM are normal. Pupils are equal, round, and reactive to light.  Neck: Normal range of motion and full passive range of motion without pain. Neck supple.  Cardiovascular: Normal rate, regular rhythm and normal heart sounds.  Exam reveals no gallop and no friction rub.   No murmur heard. Pulmonary/Chest: Effort normal and breath sounds normal. No respiratory distress. She has no wheezes. She has no rhonchi. She has no rales. She exhibits no tenderness and no crepitus.  Abdominal: Soft. Normal appearance and bowel sounds are normal. She exhibits no distension. There is no tenderness. There is no rebound and no guarding.  Musculoskeletal: Normal range of motion. She exhibits no edema and no tenderness.  Moves all extremities well.  Neurological: She is alert and oriented to person, place, and time. She has normal strength. No cranial nerve deficit.  Skin: Skin is warm, dry and intact. No rash noted. No erythema. No pallor.  Psychiatric: She has a normal mood and affect. Her speech is normal and behavior is normal. Her mood appears not anxious.    ED Course  Procedures (including critical care time)  Medications  ondansetron (ZOFRAN) injection 4 mg (not administered)  ondansetron (ZOFRAN-ODT) disintegrating tablet 4 mg (4 mg Oral Given 02/26/14 1218)    Patient was given Zofran and was able to drink fluids and felt well enough to go home.  Labs Review Results for orders placed during the hospital encounter of 02/26/14  CBC WITH DIFFERENTIAL      Result Value Ref Range   WBC 7.3  4.0 - 10.5 K/uL   RBC 4.70  3.87 - 5.11 MIL/uL   Hemoglobin 15.0  12.0 - 15.0 g/dL   HCT 96.0  45.4 - 09.8 %   MCV 90.4  78.0 - 100.0 fL   MCH 31.9  26.0 - 34.0 pg   MCHC 35.3  30.0 - 36.0 g/dL   RDW 11.9  14.7 - 82.9 %   Platelets 299  150 - 400 K/uL   Neutrophils Relative % 89 (*) 43 - 77 %   Neutro Abs 6.5  1.7 - 7.7 K/uL   Lymphocytes Relative 6 (*) 12 - 46 %   Lymphs Abs 0.4 (*) 0.7 - 4.0 K/uL   Monocytes Relative 5  3 - 12 %   Monocytes Absolute 0.3  0.1 - 1.0 K/uL   Eosinophils Relative 0  0 - 5 %   Eosinophils Absolute 0.0  0.0 - 0.7 K/uL   Basophils Relative 0  0 - 1 %   Basophils Absolute 0.0  0.0 - 0.1 K/uL  COMPREHENSIVE METABOLIC PANEL      Result Value Ref Range   Sodium 140  137 - 147 mEq/L   Potassium 3.7  3.7 - 5.3 mEq/L   Chloride 102  96 - 112 mEq/L   CO2 26  19 - 32 mEq/L   Glucose, Bld 89  70 - 99 mg/dL   BUN 14  6 - 23 mg/dL   Creatinine, Ser 5.62  0.50 - 1.10 mg/dL   Calcium 9.4  8.4 - 13.0 mg/dL   Total Protein 8.7 (*) 6.0 - 8.3 g/dL   Albumin 4.3  3.5 - 5.2 g/dL   AST 12  0 - 37 U/L   ALT 10  0 - 35 U/L   Alkaline Phosphatase 76  39 - 117 U/L   Total Bilirubin 0.7  0.3 - 1.2 mg/dL   GFR calc non Af Amer >90  >90 mL/min   GFR calc Af Amer >90  >90 mL/min  LIPASE, BLOOD      Result Value Ref Range   Lipase 30  11 - 59 U/L  URINALYSIS, ROUTINE W REFLEX MICROSCOPIC      Result Value Ref Range   Color, Urine YELLOW  YELLOW   APPearance CLEAR  CLEAR   Specific Gravity, Urine >1.030 (*) 1.005 - 1.030   pH 6.0  5.0 - 8.0   Glucose, UA NEGATIVE  NEGATIVE mg/dL   Hgb urine dipstick MODERATE (*) NEGATIVE   Bilirubin Urine NEGATIVE  NEGATIVE   Ketones, ur NEGATIVE  NEGATIVE mg/dL   Protein, ur NEGATIVE  NEGATIVE mg/dL    Urobilinogen, UA 0.2  0.0 - 1.0 mg/dL  Nitrite POSITIVE (*) NEGATIVE   Leukocytes, UA NEGATIVE  NEGATIVE  URINE MICROSCOPIC-ADD ON      Result Value Ref Range   Squamous Epithelial / LPF RARE  RARE   WBC, UA 0-2  <3 WBC/hpf   RBC / HPF 0-2  <3 RBC/hpf   Bacteria, UA MANY (*) RARE   Laboratory interpretation all normal except concentrated urine consistent with dehydration, positive nitrites without WBC seen on microscopic exam    Imaging Review No results found.  EKG Interpretation  None  MDM   Final diagnoses:  Nausea and vomiting   Discharge Medication List as of 02/26/2014  2:44 PM    START taking these medications   Details  ondansetron (ZOFRAN) 4 MG tablet Take 1 tablet (4 mg total) by mouth every 8 (eight) hours as needed for nausea or vomiting., Starting 02/26/2014, Until Discontinued, Print        Plan discharge   Devoria AlbeIva Pascha Fogal, MD, Franz DellFACEP      Skyla Champagne L Darrelle Barrell, MD 02/26/14 [id number]

## 2014-07-05 ENCOUNTER — Encounter (HOSPITAL_COMMUNITY): Payer: Self-pay | Admitting: Emergency Medicine

## 2014-07-05 ENCOUNTER — Emergency Department (HOSPITAL_COMMUNITY)
Admission: EM | Admit: 2014-07-05 | Discharge: 2014-07-05 | Disposition: A | Discharge status: Home / Self Care | Payer: Self-pay | Attending: Emergency Medicine | Admitting: Emergency Medicine

## 2014-07-05 DIAGNOSIS — Y9389 Activity, other specified: Secondary | ICD-10-CM | POA: Insufficient documentation

## 2014-07-05 DIAGNOSIS — Z87442 Personal history of urinary calculi: Secondary | ICD-10-CM | POA: Insufficient documentation

## 2014-07-05 DIAGNOSIS — Y9289 Other specified places as the place of occurrence of the external cause: Secondary | ICD-10-CM | POA: Insufficient documentation

## 2014-07-05 DIAGNOSIS — I10 Essential (primary) hypertension: Secondary | ICD-10-CM | POA: Insufficient documentation

## 2014-07-05 DIAGNOSIS — IMO0002 Reserved for concepts with insufficient information to code with codable children: Secondary | ICD-10-CM | POA: Insufficient documentation

## 2014-07-05 DIAGNOSIS — Z8659 Personal history of other mental and behavioral disorders: Secondary | ICD-10-CM | POA: Insufficient documentation

## 2014-07-05 DIAGNOSIS — S335XXA Sprain of ligaments of lumbar spine, initial encounter: Secondary | ICD-10-CM | POA: Insufficient documentation

## 2014-07-05 DIAGNOSIS — S39012A Strain of muscle, fascia and tendon of lower back, initial encounter: Secondary | ICD-10-CM

## 2014-07-05 MED ORDER — HYDROCODONE-ACETAMINOPHEN 5-325 MG PO TABS: 1.0000 | ORAL_TABLET | ORAL | Status: DC | PRN

## 2014-07-05 NOTE — ED Notes (Signed)
ED NP in to see patient for primary assessment

## 2014-07-05 NOTE — Discharge Instructions (Signed)
Continue your  Anti inflammatory medication. Do not take the narcotic if you are driving as it will make you sleepy. [facility]

## 2014-07-05 NOTE — ED Notes (Signed)
Onset last night of low back pain. No known injury, Increased pain with movement and lying down,.

## 2014-07-05 NOTE — ED Provider Notes (Signed)
CSN: [id number]     Arrival date & time 07/05/14  1236 History   First MD Initiated Contact with Patient 07/05/14 1305    This chart was scribed for nurse practitioner working with Donnetta HutchingBrian Sahara Fujimoto, MD, by Andrew Auaven Small, ED Scribe. This patient was seen in room APFT24/APFT24 and the patient's care was started at 1:09 PM. Chief Complaint  Patient presents with  . Back Pain   Patient is a 28 y.o. female presenting with back pain. The history is provided by the patient. No language interpreter was used.  Back Pain Location:  Generalized Quality: sharp. Radiates to:  Does not radiate Pain severity:  Moderate Onset quality:  Gradual Duration:  1 day Progression:  Waxing and waning Chronicity:  Recurrent Relieved by:  Nothing Worsened by:  Movement, twisting and lying down Associated symptoms: no abdominal pain, no abdominal swelling, no bladder incontinence, no bowel incontinence, no chest pain, no dysuria, no fever, no headaches, no leg pain, no tingling and no weakness     Lori Huerta is a 28 y.o. female who presents to the Emergency Department complaining of worsening, waxing and waning, sharp back pain that began 1 day ago. Pt reports pain is to entire back worsened with movement, twisting and lying down. She states she had to sleep sitting up last night. Pt has h/o back pain in which she was prescribed maloxicam as needed by Dr. Ermalinda MemosBradshaw about 1 year ago. Pt took 1 maloxicam 1 day ago without relief to pain. Pt denies fever, chills, otalgia, sore throat, HA, dizziness, abdominal pain, n/v/d.  Pt denies bladder incontinence. She denies loss of bowels. 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   Past Medical History  Diagnosis Date  . Bipolar 1 disorder   . Anxiety   . HYPERTENSION 12/07/2010    only with past pregnancy  . Kidney stones    Past Surgical History  Procedure Laterality Date  . Cesarean section    . Laparoscopy  02/04/2013    Procedure: LAPAROSCOPY OPERATIVE;   Surgeon: Allie BossierMyra C Dove, MD;  Location: [facility];  Service: Gynecology;  Laterality: N/A;  . Bilateral salpingectomy  02/04/2013    Procedure: BILATERAL SALPINGECTOMY;  Surgeon: Allie BossierMyra C Dove, MD;  Location: [facility];  Service: Gynecology;  Laterality: N/A;   History reviewed. No pertinent family history. History  Substance Use Topics  . Smoking status: Never Smoker   . Smokeless tobacco: Not on file  . Alcohol Use: Yes     Comment: occasionally   OB History   Grav Para Term Preterm Abortions TAB SAB Ect Mult Living                 Review of Systems  Constitutional: Negative for fever and chills.  HENT: Negative for ear pain and sore throat.   Respiratory: Negative for shortness of breath.   Cardiovascular: Negative for chest pain.  Gastrointestinal: Negative for nausea, vomiting, abdominal pain, diarrhea, constipation, blood in stool and bowel incontinence.  Genitourinary: Negative for bladder incontinence, dysuria, urgency, hematuria, decreased urine volume and difficulty urinating.  Musculoskeletal: Positive for back pain.  Neurological: Negative for tingling, weakness and headaches.   Allergies  Review of patient's allergies indicates no known allergies.  Home Medications   Prior to Admission medications   Not on File   BP 116/80  Pulse 80  Temp(Src) 97.7 F (36.5 C) (Oral)  Resp 20  Ht 5\' 6"  (1.676 m)  Wt 135 lb (61.236 kg)  BMI 21.80 kg/m2  SpO2 100%  LMP 06/19/2014 Physical Exam  Nursing note and vitals reviewed. Constitutional: She is oriented to person, place, and time. She appears well-developed and well-nourished. No distress.  HENT:  Head: Normocephalic and atraumatic.  Right Ear: Tympanic membrane normal.  Left Ear: Tympanic membrane normal.  Nose: Nose normal.  Mouth/Throat: Uvula is midline, oropharynx is clear and moist and mucous membranes are normal.  Eyes: Conjunctivae and EOM are normal. Pupils are equal, round, and reactive to light.  Neck: Normal  range of motion. Neck supple.  Cardiovascular: Normal rate and regular rhythm.   Pulmonary/Chest: Effort normal. She has no wheezes. She has no rales.  Abdominal: Soft. Bowel sounds are normal. There is no tenderness.  Musculoskeletal: Normal range of motion.       Lumbar back: She exhibits tenderness, pain and spasm. She exhibits normal pulse.  straight leg raises without pain or difficulty Good strength in plantar flexion and extension No edema to upper or lower extremities  No pain over spinal column. Lumbar tenderness with bending forward   Neurological: She is alert and oriented to person, place, and time. She has normal strength. No cranial nerve deficit or sensory deficit. Gait normal.  Reflex Scores:      Bicep reflexes are 2+ on the right side and 2+ on the left side.      Brachioradialis reflexes are 2+ on the right side and 2+ on the left side.      Patellar reflexes are 2+ on the right side and 2+ on the left side.      Achilles reflexes are 2+ on the right side and 2+ on the left side. Skin: Skin is warm and dry.  Psychiatric: She has a normal mood and affect. Her behavior is normal.    ED Course  Procedures DIAGNOSTIC STUDIES: Oxygen Saturation is 100% on RA, normal by my interpretation.    COORDINATION OF CARE: 1:24 PM-Discussed treatment plan which includes pain medication with pt at bedside and pt agreed to plan. Pt is advised to make an appointment with PCP.     MDM  28 y.o. female with low back pain that started last night without known injury. Hx of low back pain one year ago and was prescribed Mobic which she took one last night but it has not helped. Stable for discharge without neuro deficits. She will follow up with her PCP. Final diagnoses:  Lumbar strain, initial encounter    I personally performed the services described in this documentation, which was scribed in my presence. The recorded information has been reviewed and is accurate.      Hope Orlene OchM  Neese, [place]NP 07/06/14 1837  Medical screening examination/treatment/procedure(s) were performed by non-physician practitioner and as supervising physician I was immediately available for consultation/collaboration.   EKG Interpretation None       Donnetta HutchingBrian Kenneth Cuaresma, MD 07/08/14 [phone]

## 2014-10-15 ENCOUNTER — Other Ambulatory Visit: Payer: Self-pay

## 2014-10-24 ENCOUNTER — Emergency Department (HOSPITAL_COMMUNITY)
Admission: EM | Admit: 2014-10-24 | Discharge: 2014-10-24 | Disposition: A | Discharge status: Home / Self Care | Payer: Self-pay | Attending: Emergency Medicine | Admitting: Emergency Medicine

## 2014-10-24 DIAGNOSIS — Z8659 Personal history of other mental and behavioral disorders: Secondary | ICD-10-CM | POA: Insufficient documentation

## 2014-10-24 DIAGNOSIS — I10 Essential (primary) hypertension: Secondary | ICD-10-CM | POA: Insufficient documentation

## 2014-10-24 DIAGNOSIS — R51 Headache: Secondary | ICD-10-CM | POA: Insufficient documentation

## 2014-10-24 DIAGNOSIS — J0191 Acute recurrent sinusitis, unspecified: Secondary | ICD-10-CM | POA: Insufficient documentation

## 2014-10-24 DIAGNOSIS — Z87442 Personal history of urinary calculi: Secondary | ICD-10-CM | POA: Insufficient documentation

## 2014-10-24 DIAGNOSIS — J029 Acute pharyngitis, unspecified: Secondary | ICD-10-CM | POA: Insufficient documentation

## 2014-10-24 DIAGNOSIS — H6502 Acute serous otitis media, left ear: Secondary | ICD-10-CM | POA: Insufficient documentation

## 2014-10-24 MED ORDER — AZITHROMYCIN 250 MG PO TABS: 250.0000 mg | ORAL_TABLET | Freq: Every day | ORAL | Status: DC

## 2014-10-24 MED ORDER — PREDNISONE 20 MG PO TABS: ORAL_TABLET | ORAL | Status: DC

## 2014-10-24 NOTE — ED Provider Notes (Signed)
CSN: [id number]     Arrival date & time 10/24/14  0950 History   First MD Initiated Contact with Patient 10/24/14 1010     Chief Complaint  Patient presents with  . sinus congestion      (Consider location/radiation/quality/duration/timing/severity/associated sxs/prior Treatment) HPI   Lori Huerta is a 28 y.o. female who presents to the Emergency Department complaining of left-sided ear pain, facial pain, left sided headache, and nasal congestion. She states the symptoms began with earache one week ago. She states that she has frequent sinus infections and this pain feels similar to previous. She states that she has been using an over-the-counter sinus medication without relief. She reports pressure and fullness to the left ear and a throbbing sensation to the left temple. She also complains of pressure to her face and difficulty breathing from the left side of her nose. She denies dizziness, neck pain or stiffness, fever, chills, or vomiting.     Past Medical History  Diagnosis Date  . Bipolar 1 disorder   . Anxiety   . HYPERTENSION 12/07/2010    only with past pregnancy  . Kidney stones    Past Surgical History  Procedure Laterality Date  . Cesarean section    . Laparoscopy  02/04/2013    Procedure: LAPAROSCOPY OPERATIVE;  Surgeon: Allie BossierMyra C Dove, MD;  Location: [facility];  Service: Gynecology;  Laterality: N/A;  . Bilateral salpingectomy  02/04/2013    Procedure: BILATERAL SALPINGECTOMY;  Surgeon: Allie BossierMyra C Dove, MD;  Location: [facility];  Service: Gynecology;  Laterality: N/A;   No family history on file. History  Substance Use Topics  . Smoking status: Never Smoker   . Smokeless tobacco: Not on file  . Alcohol Use: Yes     Comment: occasionally   OB History   Grav Para Term Preterm Abortions TAB SAB Ect Mult Living                 Review of Systems  Constitutional: Negative for fever, chills, activity change and appetite change.  HENT: Positive for congestion, ear pain,  rhinorrhea, sinus pressure and sore throat. Negative for facial swelling, trouble swallowing and voice change.   Eyes: Negative for photophobia, pain and visual disturbance.  Respiratory: Negative for cough, chest tightness, shortness of breath, wheezing and stridor.   Cardiovascular: Negative for chest pain.  Gastrointestinal: Negative for nausea, vomiting and abdominal pain.  Musculoskeletal: Negative for neck pain and neck stiffness.  Skin: Negative.  Negative for rash.  Neurological: Positive for headaches. Negative for dizziness, syncope, speech difficulty, weakness and numbness.  Hematological: Negative for adenopathy.  Psychiatric/Behavioral: Negative for confusion.  All other systems reviewed and are negative.     Allergies  Review of patient's allergies indicates no known allergies.  Home Medications   Prior to Admission medications   Not on File   BP 113/95  Pulse 81  Temp(Src) 97.8 F (36.6 C) (Oral)  Resp 18  Ht 5\' 6"  (1.676 m)  Wt 140 lb (63.504 kg)  BMI 22.61 kg/m2  SpO2 100%  LMP 10/20/2014 Physical Exam  Nursing note and vitals reviewed. Constitutional: She is oriented to person, place, and time. She appears well-developed and well-nourished. No distress.  HENT:  Head: Normocephalic and atraumatic.  Right Ear: Tympanic membrane and ear canal normal.  Left Ear: Ear canal normal. No mastoid tenderness. Tympanic membrane is erythematous. Tympanic membrane is not perforated and not bulging. A middle ear effusion is present. No hemotympanum.  Nose: Mucosal edema  and rhinorrhea present. Left sinus exhibits maxillary sinus tenderness and frontal sinus tenderness.  Mouth/Throat: Uvula is midline and mucous membranes are normal. No trismus in the jaw. No uvula swelling. Posterior oropharyngeal erythema present. No oropharyngeal exudate, posterior oropharyngeal edema or tonsillar abscesses.  Eyes: Conjunctivae are normal.  Neck: Normal range of motion and phonation  normal. Neck supple. No Kernig's sign noted.  Cardiovascular: Normal rate, regular rhythm, normal heart sounds and intact distal pulses.   No murmur heard. Pulmonary/Chest: Effort normal and breath sounds normal. No respiratory distress. She has no wheezes. She has no rales.  Musculoskeletal: Normal range of motion. She exhibits no edema.  Lymphadenopathy:    She has no cervical adenopathy.  Neurological: She is alert and oriented to person, place, and time. She exhibits normal muscle tone. Coordination normal.  Skin: Skin is warm and dry.    ED Course  Procedures (including critical care time) Labs Review Labs Reviewed - No data to display  Imaging Review No results found.   EKG Interpretation None      MDM   Final diagnoses:  Acute serous otitis media of left ear, recurrence not specified  Acute recurrent sinusitis, unspecified location   Vital signs are stable. Patient is well-appearing and nontoxic. No meningeal signs Patient has likely early otitis media and sinusitis. She agrees to Tylenol or ibuprofen every 4-6 hours if needed for body aches or fever close follow-up with her primary care physician for recheck.  She appears stable for discharge     Anika Shore L. Nasri Boakye, PA-C 10/24/14 1047

## 2014-10-24 NOTE — ED Notes (Signed)
Pt c/o sinus pain in left side of face and left ear x 3 days;

## 2014-10-24 NOTE — ED Provider Notes (Signed)
Medical screening examination/treatment/procedure(s) were performed by non-physician practitioner and as supervising physician I was immediately available for consultation/collaboration.   Linwood DibblesJon Devynne Sturdivant, MD 10/24/14 [phone]

## 2014-10-24 NOTE — Discharge Instructions (Signed)

## 2014-11-16 IMAGING — CT CT ABD-PELV W/O CM
2 of 3 series · 9 of 46 positions shown, 11 images · non-contrast
Comparison: 06/02/2012 and prior CTs

CLINICAL DATA: 26-year-old female with left flank, abdominal and
pelvic pain with dysuria.

CT ABDOMEN AND PELVIS WITHOUT CONTRAST
TECHNIQUE: Multidetector CT imaging of the abdomen and pelvis was
performed following the standard protocol without intravenous
contrast.

[Series 4: mpr coronal 3.0mm · coronal · 0.58mm/px · 8 of 68 slices shown, 9 images]
[im 8/68  soft-tissue]
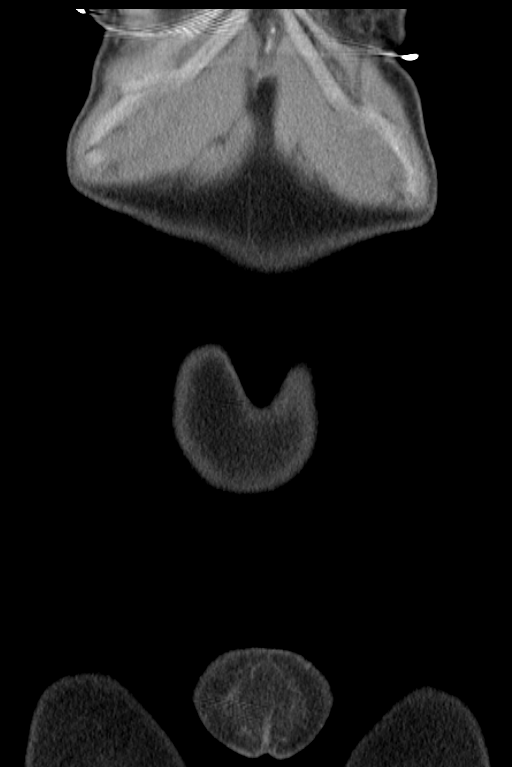
[im 8/68  bone]
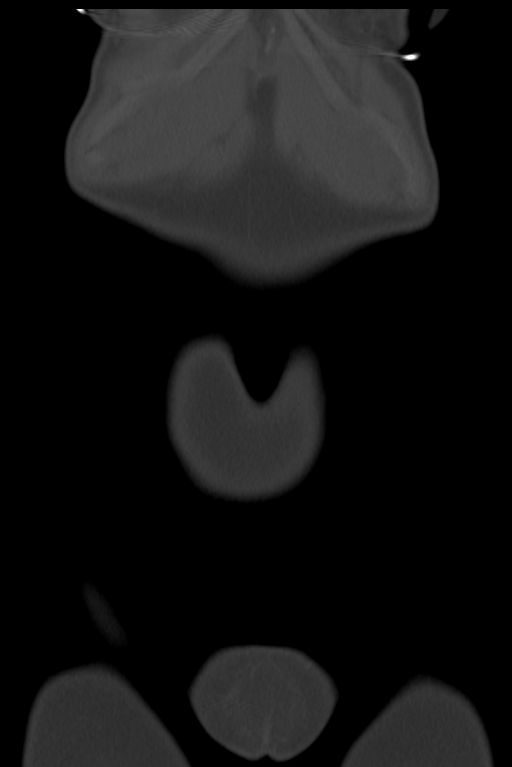
[im 15/68  soft-tissue]
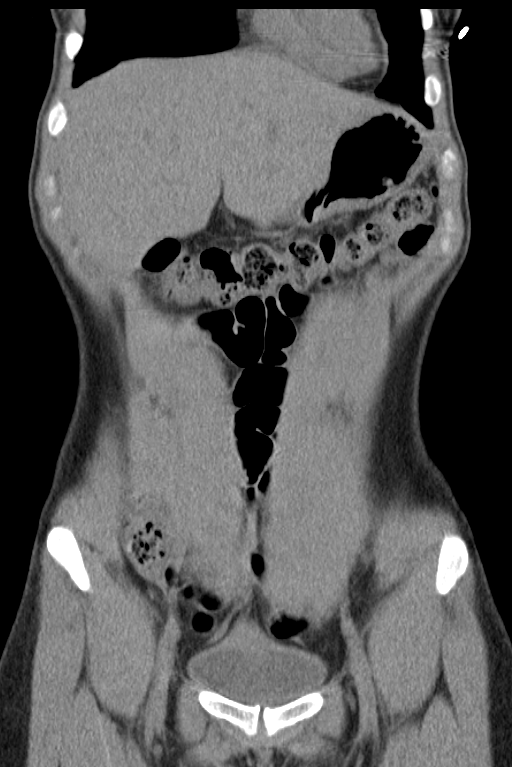
[im 23/68  soft-tissue]
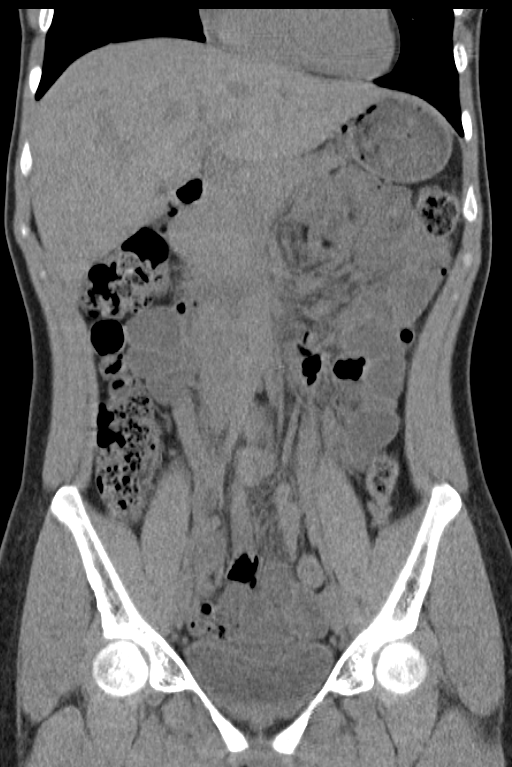
[im 30/68  soft-tissue]
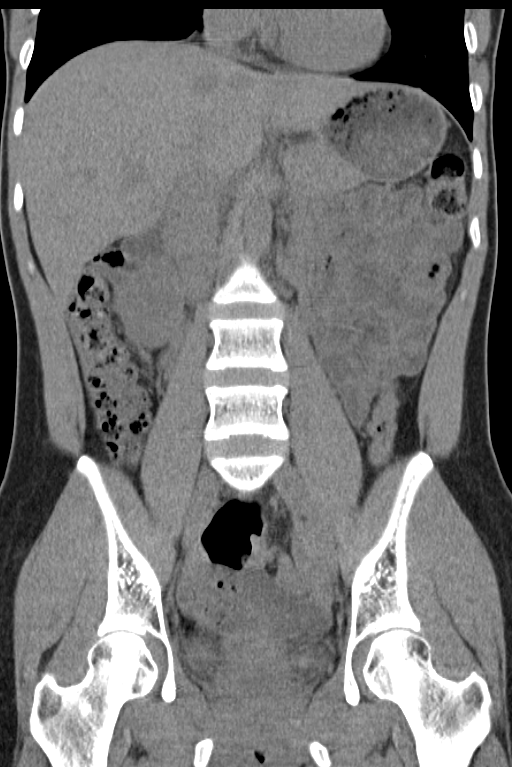
[im 38/68  soft-tissue]
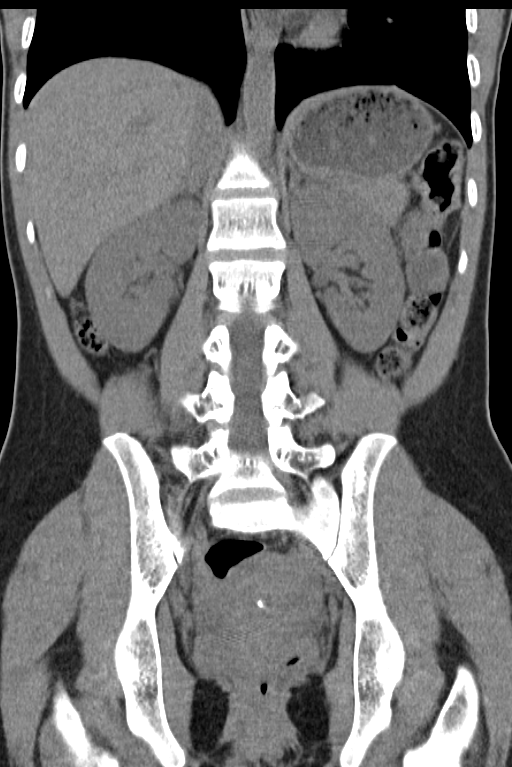
[im 45/68  soft-tissue]
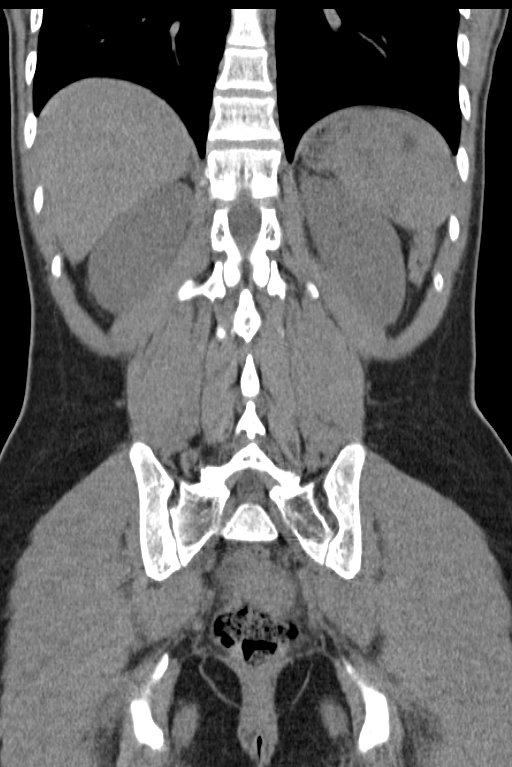
[im 53/68  soft-tissue]
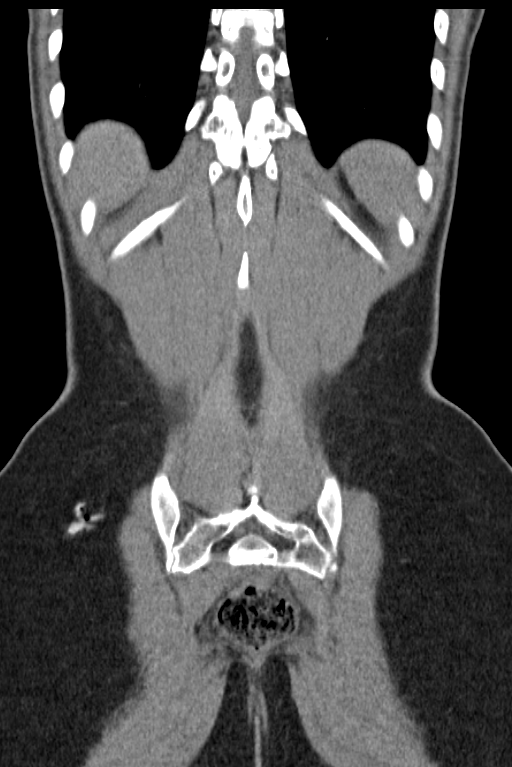
[im 60/68  soft-tissue]
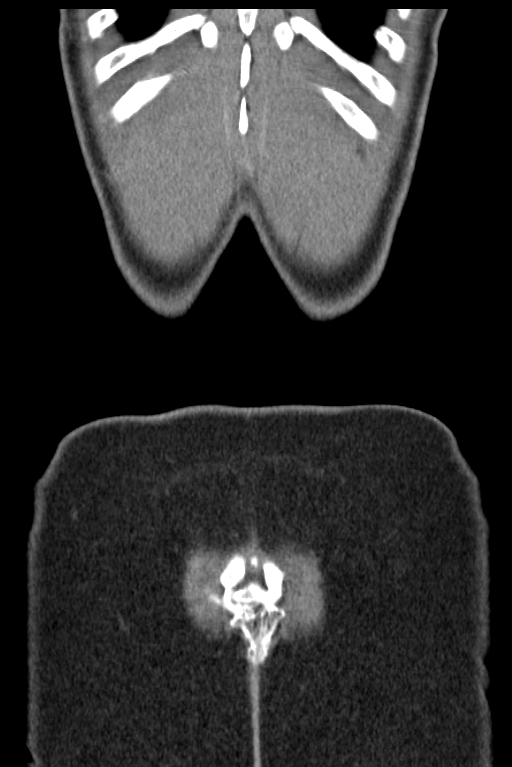

[Series 5: mpr sagittal 3.0mm · sagittal · 0.46mm/px · 1 of 100 slices shown, 2 images]
[im 34/100  soft-tissue]
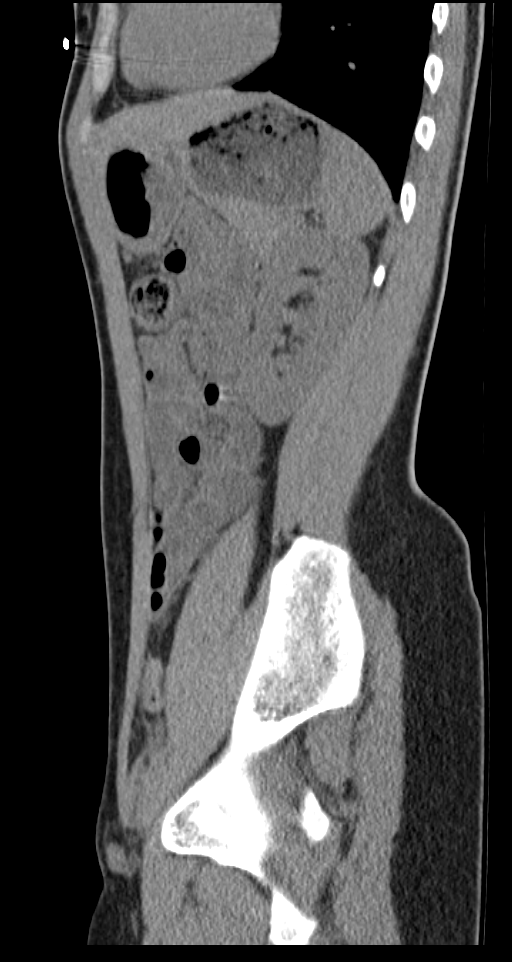
[im 34/100  bone]
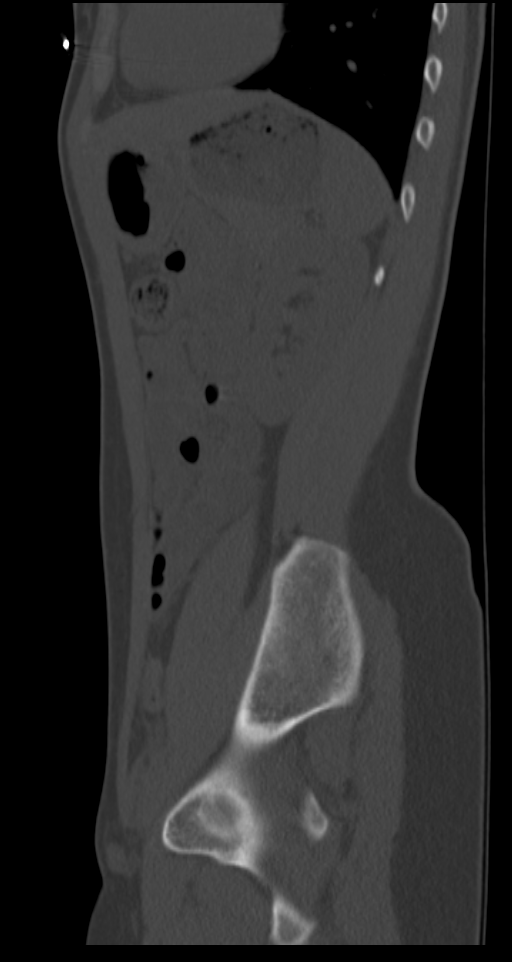

[9 of 46 positions shown; findings below may reference images not displayed]

FINDINGS: The liver, gallbladder, spleen, adrenal glands, pancreas
and kidneys are unremarkable.
Please note that parenchymal abnormalities may be missed as
intravenous contrast was not administered.
No free fluid, enlarged lymph nodes, biliary dilation or abdominal
aortic aneurysm identified.
The bowel and bladder are unremarkable.

An IUD is unchanged but appears tilted the uterus.

No acute or suspicious bony abnormalities are identified.
IMPRESSION: No evidence of acute abnormality - no evidence of urinary calculi.

Unchanged IUD but appears tilted within the uterus.  Consider
further evaluation is indicated.

## 2015-01-12 ENCOUNTER — Emergency Department (HOSPITAL_COMMUNITY)
Admission: EM | Admit: 2015-01-12 | Discharge: 2015-01-12 | Disposition: A | Discharge status: Home / Self Care | Payer: Self-pay | Attending: Emergency Medicine | Admitting: Emergency Medicine

## 2015-01-12 ENCOUNTER — Encounter (HOSPITAL_COMMUNITY): Payer: Self-pay

## 2015-01-12 DIAGNOSIS — Z8659 Personal history of other mental and behavioral disorders: Secondary | ICD-10-CM | POA: Insufficient documentation

## 2015-01-12 DIAGNOSIS — G479 Sleep disorder, unspecified: Secondary | ICD-10-CM | POA: Insufficient documentation

## 2015-01-12 DIAGNOSIS — H6592 Unspecified nonsuppurative otitis media, left ear: Secondary | ICD-10-CM | POA: Insufficient documentation

## 2015-01-12 DIAGNOSIS — Z792 Long term (current) use of antibiotics: Secondary | ICD-10-CM | POA: Insufficient documentation

## 2015-01-12 DIAGNOSIS — J029 Acute pharyngitis, unspecified: Secondary | ICD-10-CM | POA: Insufficient documentation

## 2015-01-12 DIAGNOSIS — H9202 Otalgia, left ear: Secondary | ICD-10-CM

## 2015-01-12 DIAGNOSIS — Z87442 Personal history of urinary calculi: Secondary | ICD-10-CM | POA: Insufficient documentation

## 2015-01-12 MED ORDER — AMOXICILLIN 500 MG PO CAPS: 500.0000 mg | ORAL_CAPSULE | Freq: Three times a day (TID) | ORAL | Status: DC

## 2015-01-12 NOTE — ED Provider Notes (Signed)
CSN: [id number]     Arrival date & time 01/12/15  0810 History  This chart was scribed for Joya Gaskins, MD by Tonye Royalty, ED Scribe. This patient was seen in room APA05/APA05 and the patient's care was started at 8:17 AM.    Chief Complaint  Patient presents with  . Otalgia   Patient is a 29 y.o. female presenting with ear pain. The history is provided by the patient. No language interpreter was used.  Otalgia Location:  Left Behind ear:  No abnormality Quality:  Unable to specify Severity:  Moderate Onset quality:  Gradual Duration:  2 days Timing:  Constant Progression:  Worsening Chronicity:  New Context: not direct blow   Relieved by:  Nothing Worsened by:  Nothing tried Ineffective treatments:  OTC medications Associated symptoms: sore throat   Associated symptoms: no cough, no ear discharge, no fever and no vomiting     HPI Comments: Lori Huerta is a 29 y.o. female who presents to the Emergency Department complaining of left ear pain with onset 2 days ago. She reports associated sore throat. She notes pain woke her last night. She states she initially thought it was sinus problem and used sinus medication, but now thinks it originates from her ear. She states she has not used any medication today. She denies recent antibiotic use. She denies drainage, fever, vomiting, or cough.  Past Medical History  Diagnosis Date  . Bipolar 1 disorder   . Anxiety   . HYPERTENSION 12/07/2010    only with past pregnancy  . Kidney stones    Past Surgical History  Procedure Laterality Date  . Cesarean section    . Laparoscopy  02/04/2013    Procedure: LAPAROSCOPY OPERATIVE;  Surgeon: Allie Bossier, MD;  Location: [facility];  Service: Gynecology;  Laterality: N/A;  . Bilateral salpingectomy  02/04/2013    Procedure: BILATERAL SALPINGECTOMY;  Surgeon: Allie Bossier, MD;  Location: [facility];  Service: Gynecology;  Laterality: N/A;   No family history on file. History  Substance Use Topics   . Smoking status: Never Smoker   . Smokeless tobacco: Not on file  . Alcohol Use: Yes     Comment: occasionally   OB History    No data available     Review of Systems  Constitutional: Negative for fever.  HENT: Positive for ear pain and sore throat. Negative for ear discharge.   Respiratory: Negative for cough.   Gastrointestinal: Negative for vomiting.  Psychiatric/Behavioral: Positive for sleep disturbance.  All other systems reviewed and are negative.     Allergies  Review of patient's allergies indicates no known allergies.  Home Medications   Prior to Admission medications   Medication Sig Start Date End Date Taking? Authorizing Provider  azithromycin (ZITHROMAX) 250 MG tablet Take 1 tablet (250 mg total) by mouth daily. Take first 2 tablets together, then 1 every day until finished. 10/24/14   Tammy L. Triplett, PA-C  predniSONE (DELTASONE) 20 MG tablet Take 3 tablets po qd x 2 days, then 2 tablets po qd x 2 days, then 1 tablet po qd x 2 days 10/24/14   Tammy L. Triplett, PA-C  pseudoephedrine-acetaminophen (TYLENOL SINUS) 30-500 MG TABS Take 2 tablets by mouth every 4 (four) hours as needed (sinus congestion).    Historical Provider, MD   BP 112/81 mmHg  Pulse 86  Temp(Src) 98.4 F (36.9 C) (Oral)  Resp 18  Ht  (1.676 m)  Wt 135 lb (61.236 kg)  BMI 21.80 kg/m2  SpO2 100% Physical Exam  Nursing note and vitals reviewed.   CONSTITUTIONAL: Well developed/well nourished HEAD: Normocephalic/atraumatic EYES: EOMI/PERRL ENMT: Mucous membranes moist, left TM bulging with fluid noted behind TM, left TM is intact, no erythema noted NECK: supple no meningeal signs CV: S1/S2 noted, no murmurs/rubs/gallops noted LUNGS: Lungs are clear to auscultation bilaterally, no apparent distress ABDOMEN: soft, nontender, no rebound or guarding, bowel sounds noted throughout abdomen NEURO: Pt is awake/alert/appropriate, moves all extremitiesx4.  No facial droop.    EXTREMITIES: pulses normal/equal, full ROM SKIN: warm, color normal PSYCH: no abnormalities of mood noted, alert and oriented to situation   ED Course  Procedures   DIAGNOSTIC STUDIES: Oxygen Saturation is 100% on room air, normal by my interpretation.    COORDINATION OF CARE: 8:26 AM Discussed treatment plan with patient at beside, including Sudafed, Ibuprofen, and antibiotic if symptoms do not improve in 24 hours. The patient agrees with the plan and has no further questions at this time.    MDM   Final diagnoses:  Otalgia of left ear  Otitis media with effusion, left    Nursing notes including past medical history and social history reviewed and considered in documentation   I personally performed the services described in this documentation, which was scribed in my presence. The recorded information has been reviewed and is accurate.      Joya Gaskinsonald W Makayle Krahn, MD 01/12/15 [phone]

## 2015-01-12 NOTE — ED Notes (Signed)
Pt c/o left earache x 2 days.  Reports has been taking an OTC sinus medication without relief.  Has not taken any meds today.

## 2015-02-09 ENCOUNTER — Emergency Department (HOSPITAL_COMMUNITY): Payer: Self-pay

## 2015-02-09 ENCOUNTER — Emergency Department (HOSPITAL_COMMUNITY)
Admission: EM | Admit: 2015-02-09 | Discharge: 2015-02-09 | Disposition: A | Discharge status: Home / Self Care | Payer: Self-pay | Attending: Emergency Medicine | Admitting: Emergency Medicine

## 2015-02-09 ENCOUNTER — Encounter (HOSPITAL_COMMUNITY): Payer: Self-pay | Admitting: Emergency Medicine

## 2015-02-09 DIAGNOSIS — Z8659 Personal history of other mental and behavioral disorders: Secondary | ICD-10-CM | POA: Insufficient documentation

## 2015-02-09 DIAGNOSIS — Z792 Long term (current) use of antibiotics: Secondary | ICD-10-CM | POA: Insufficient documentation

## 2015-02-09 DIAGNOSIS — H9202 Otalgia, left ear: Secondary | ICD-10-CM | POA: Insufficient documentation

## 2015-02-09 DIAGNOSIS — R51 Headache: Secondary | ICD-10-CM | POA: Insufficient documentation

## 2015-02-09 DIAGNOSIS — Z87442 Personal history of urinary calculi: Secondary | ICD-10-CM | POA: Insufficient documentation

## 2015-02-09 LAB — BASIC METABOLIC PANEL
Anion gap: 4 — ABNORMAL LOW (ref 5–15)
BUN: 14 mg/dL (ref 6–23)
CO2: 25 mmol/L (ref 19–32)
Calcium: 8.9 mg/dL (ref 8.4–10.5)
Chloride: 108 mmol/L (ref 96–112)
Creatinine, Ser: 0.71 mg/dL (ref 0.50–1.10)
GFR calc Af Amer: >90 mL/min (ref >90)
Glucose, Bld: 90 mg/dL (ref 70–99)
POTASSIUM: 3.8 mmol/L (ref 3.5–5.1)
SODIUM: 137 mmol/L (ref 135–145)

## 2015-02-09 LAB — CBC WITH DIFFERENTIAL/PLATELET
BASOS PCT: 1 % (ref 0–1)
Basophils Absolute: 0 10*3/uL (ref 0.0–0.1)
Eosinophils Absolute: 0 10*3/uL (ref 0.0–0.7)
Eosinophils Relative: 1 % (ref 0–5)
HEMATOCRIT: 38.5 % (ref 36.0–46.0)
Hemoglobin: 13.4 g/dL (ref 12.0–15.0)
LYMPHS ABS: 1.6 10*3/uL (ref 0.7–4.0)
Lymphocytes Relative: 29 % (ref 12–46)
MCH: 31.5 pg (ref 26.0–34.0)
MCHC: 34.8 g/dL (ref 30.0–36.0)
MCV: 90.6 fL (ref 78.0–100.0)
Monocytes Absolute: 0.3 10*3/uL (ref 0.1–1.0)
Monocytes Relative: 5 % (ref 3–12)
NEUTROS PCT: 64 % (ref 43–77)
Neutro Abs: 3.7 10*3/uL (ref 1.7–7.7)
PLATELETS: 292 10*3/uL (ref 150–400)
RBC: 4.25 MIL/uL (ref 3.87–5.11)
RDW: 12.7 % (ref 11.5–15.5)
WBC: 5.6 10*3/uL (ref 4.0–10.5)

## 2015-02-09 MED ORDER — AZITHROMYCIN 250 MG PO TABS: 250.0000 mg | ORAL_TABLET | Freq: Every day | ORAL | Status: DC

## 2015-02-09 MED ORDER — NAPROXEN 500 MG PO TABS: 500.0000 mg | ORAL_TABLET | Freq: Two times a day (BID) | ORAL | Status: DC

## 2015-02-09 MED ORDER — HYDROCODONE-ACETAMINOPHEN 5-325 MG PO TABS
1.0000 | ORAL_TABLET | Freq: Once | ORAL | Status: AC
  Administered 2015-02-09: 1 via ORAL
  Filled 2015-02-09: qty 1

## 2015-02-09 MED ORDER — HYDROCODONE-ACETAMINOPHEN 5-325 MG PO TABS: 1.0000 | ORAL_TABLET | Freq: Four times a day (QID) | ORAL | Status: DC | PRN

## 2015-02-09 NOTE — ED Provider Notes (Signed)
CSN: [id number]     Arrival date & time 02/09/15  [id number] History  This chart was scribed for Lori MuldersScott Roshaunda Starkey, MD by Tonye RoyaltyJoshua Chen, ED Scribe. This patient was seen in room APA03/APA03 and the patient's care was started at 8:31 AM.    Chief Complaint  Patient presents with  . Otalgia   Patient is a 29 y.o. female presenting with ear pain. The history is provided by the patient. No language interpreter was used.  Otalgia Location:  Left Behind ear: no swelling. Quality:  Sharp Severity:  Moderate Onset quality:  Gradual Duration:  1 month Timing:  Constant Progression:  Waxing and waning Chronicity:  Recurrent Context: not direct blow   Relieved by: Amoxicillin, Ibuprofen. Worsened by:  Nothing tried Ineffective treatments:  None tried Associated symptoms: headaches   Associated symptoms: no abdominal pain, no cough, no diarrhea, no fever, no neck pain, no rash, no rhinorrhea, no sore throat and no vomiting     HPI Comments: Lori Huerta is a 29 y.o. female who presents to the Emergency Department complaining of pain to her left face predominantly to her left ear with onset 1 month ago and worsening yesterday. She was evaluated here on 01/12/2015 for pain to similar area as well as teeth. She was diagnosed with sinus infection and prescribed amoxicillin. She states pain improved with Amoxicillin but never completely resolved. She rates pain today at 8/10 and describes it as sharp. She states she has been using Ibuprofen without remission. She notes that pain causes difficulty sleeping. She denies dental pain.  PCP: Kevin FentonBradshaw, Samuel, MD at [facility] She states she no longer has insurance so she cannot see her PCP.  Past Medical History  Diagnosis Date  . Bipolar 1 disorder   . Anxiety   . HYPERTENSION 12/07/2010    only with past pregnancy  . Kidney stones    Past Surgical History  Procedure Laterality Date  . Cesarean section    . Laparoscopy  02/04/2013     Procedure: LAPAROSCOPY OPERATIVE;  Surgeon: Allie BossierMyra C Dove, MD;  Location: [facility];  Service: Gynecology;  Laterality: N/A;  . Bilateral salpingectomy  02/04/2013    Procedure: BILATERAL SALPINGECTOMY;  Surgeon: Allie BossierMyra C Dove, MD;  Location: [facility];  Service: Gynecology;  Laterality: N/A;   Family History  Problem Relation Age of Onset  . Cancer Mother    History  Substance Use Topics  . Smoking status: Never Smoker   . Smokeless tobacco: Never Used  . Alcohol Use: Yes     Comment: occasionally   OB History    Gravida Para Term Preterm AB TAB SAB Ectopic Multiple Living   3 2 2  1  1   2      Review of Systems  Constitutional: Negative for fever and chills.  HENT: Positive for ear pain. Negative for dental problem, rhinorrhea and sore throat.   Eyes: Negative for visual disturbance.  Respiratory: Negative for cough and shortness of breath.   Cardiovascular: Negative for chest pain and leg swelling.  Gastrointestinal: Negative for nausea, vomiting, abdominal pain and diarrhea.  Genitourinary: Negative for dysuria.  Musculoskeletal: Negative for back pain and neck pain.  Skin: Negative for rash.  Neurological: Positive for headaches.  Hematological: Does not bruise/bleed easily.  Psychiatric/Behavioral: Negative for confusion.      Allergies  Review of patient's allergies indicates no known allergies.  Home Medications   Prior to Admission medications   Medication Sig Start Date End  Date Taking? Authorizing Provider  amoxicillin (AMOXIL) 500 MG capsule Take 1 capsule (500 mg total) by mouth 3 (three) times daily. Patient not taking: Reported on 02/09/2015 01/12/15   Joya Gaskins, MD  azithromycin (ZITHROMAX) 250 MG tablet Take 1 tablet (250 mg total) by mouth daily. Take first 2 tablets together, then 1 every day until finished. 02/09/15   Lori Mulders, MD  HYDROcodone-acetaminophen (NORCO/VICODIN) 5-325 MG per tablet Take 1-2 tablets by mouth every 6 (six) hours as needed.  02/09/15   Lori Mulders, MD  naproxen (NAPROSYN) 500 MG tablet Take 1 tablet (500 mg total) by mouth 2 (two) times daily. 02/09/15   Lori Mulders, MD   BP 115/85 mmHg  Pulse 87  Temp(Src) 98.2 F (36.8 C) (Oral)  Resp 16  Ht  (1.676 m)  Wt 135 lb (61.236 kg)  BMI 21.80 kg/m2  SpO2 100%  LMP 01/23/2015 Physical Exam  Constitutional: She is oriented to person, place, and time. She appears well-developed and well-nourished.  HENT:  Head: Normocephalic and atraumatic.  No sinus tenderness No swelling behind left ear No evidence of middle ear infection  Eyes: Conjunctivae and EOM are normal. Pupils are equal, round, and reactive to light.  Neck: Normal range of motion. Neck supple.  Cardiovascular: Normal rate, regular rhythm and normal heart sounds.   Pulmonary/Chest: Effort normal. No respiratory distress. She has no wheezes. She has no rales.  Lungs clear bilaterally  Abdominal: Soft. Bowel sounds are normal. There is no tenderness.  Musculoskeletal: Normal range of motion. She exhibits no edema (no swelling in ankles).  Lymphadenopathy:    She has no cervical adenopathy.  Neurological: She is alert and oriented to person, place, and time. No cranial nerve deficit. She exhibits normal muscle tone. Coordination normal.  Skin: Skin is warm and dry.  Psychiatric: She has a normal mood and affect.  Nursing note and vitals reviewed.   ED Course  Procedures (including critical care time)  DIAGNOSTIC STUDIES: Oxygen Saturation is 100% on room air, normal by my interpretation.    COORDINATION OF CARE: 8:39 AM Discussed treatment plan with patient at beside, labs, CTscanincluding antibiotic and follow up with ENT if symptoms do not resolve. The patient agrees with the plan and has no further questions at this time.   Labs Review Labs Reviewed  BASIC METABOLIC PANEL - Abnormal; Notable for the following:    Anion gap 4 (*)    All other components within normal limits   CBC WITH DIFFERENTIAL/PLATELET   Results for orders placed or performed during the hospital encounter of 02/09/15  Basic metabolic panel  Result Value Ref Range   Sodium 137 135 - 145 mmol/L   Potassium 3.8 3.5 - 5.1 mmol/L   Chloride 108 96 - 112 mmol/L   CO2 25 19 - 32 mmol/L   Glucose, Bld 90 70 - 99 mg/dL   BUN 14 6 - 23 mg/dL   Creatinine, Ser 1.61 0.50 - 1.10 mg/dL   Calcium 8.9 8.4 - 09.6 mg/dL   GFR calc non Af Amer >90 >90 mL/min   GFR calc Af Amer >90 >90 mL/min   Anion gap 4 (L) 5 - 15  CBC with Differential/Platelet  Result Value Ref Range   WBC 5.6 4.0 - 10.5 K/uL   RBC 4.25 3.87 - 5.11 MIL/uL   Hemoglobin 13.4 12.0 - 15.0 g/dL   HCT 04.5 40.9 - 81.1 %   MCV 90.6 78.0 - 100.0 fL   MCH  31.5 26.0 - 34.0 pg   MCHC 34.8 30.0 - 36.0 g/dL   RDW 16.1 09.6 - 04.5 %   Platelets 292 150 - 400 K/uL   Neutrophils Relative % 64 43 - 77 %   Neutro Abs 3.7 1.7 - 7.7 K/uL   Lymphocytes Relative 29 12 - 46 %   Lymphs Abs 1.6 0.7 - 4.0 K/uL   Monocytes Relative 5 3 - 12 %   Monocytes Absolute 0.3 0.1 - 1.0 K/uL   Eosinophils Relative 1 0 - 5 %   Eosinophils Absolute 0.0 0.0 - 0.7 K/uL   Basophils Relative 1 0 - 1 %   Basophils Absolute 0.0 0.0 - 0.1 K/uL     Imaging Review Ct Head Wo Contrast  02/09/2015   CLINICAL DATA:  Left ear pain for more than 1 month. No known injury. Headache.  EXAM: CT HEAD WITHOUT CONTRAST  CT MAXILLOFACIAL WITHOUT CONTRAST  TECHNIQUE: Multidetector CT imaging of the head and maxillofacial structures were performed using the standard protocol without intravenous contrast. Multiplanar CT image reconstructions of the maxillofacial structures were also generated.  COMPARISON:  None.  FINDINGS: CT HEAD FINDINGS  No acute intracranial abnormality. Specifically, no hemorrhage, hydrocephalus, mass lesion, acute infarction, or significant intracranial injury. No acute calvarial abnormality. Visualized paranasal sinuses and mastoids clear. Orbital soft tissues  unremarkable.  CT MAXILLOFACIAL FINDINGS  Orbital soft tissues are unremarkable. Paranasal sinuses and mastoid air cells are clear. No facial or orbital fracture. No soft tissue abnormality. No soft tissue or bony abnormality in the region of the left ear to explain patient's left ear pain.  IMPRESSION: No intracranial abnormality.  No soft tissue or bony abnormality within the face. Paranasal sinuses and mastoids are clear.   Electronically Signed   By: Charlett Nose M.D.   On: 02/09/2015 09:22   Ct Maxillofacial Wo Cm  02/09/2015   CLINICAL DATA:  Left ear pain for more than 1 month. No known injury. Headache.  EXAM: CT HEAD WITHOUT CONTRAST  CT MAXILLOFACIAL WITHOUT CONTRAST  TECHNIQUE: Multidetector CT imaging of the head and maxillofacial structures were performed using the standard protocol without intravenous contrast. Multiplanar CT image reconstructions of the maxillofacial structures were also generated.  COMPARISON:  None.  FINDINGS: CT HEAD FINDINGS  No acute intracranial abnormality. Specifically, no hemorrhage, hydrocephalus, mass lesion, acute infarction, or significant intracranial injury. No acute calvarial abnormality. Visualized paranasal sinuses and mastoids clear. Orbital soft tissues unremarkable.  CT MAXILLOFACIAL FINDINGS  Orbital soft tissues are unremarkable. Paranasal sinuses and mastoid air cells are clear. No facial or orbital fracture. No soft tissue abnormality. No soft tissue or bony abnormality in the region of the left ear to explain patient's left ear pain.  IMPRESSION: No intracranial abnormality.  No soft tissue or bony abnormality within the face. Paranasal sinuses and mastoids are clear.   Electronically Signed   By: Charlett Nose M.D.   On: 02/09/2015 09:22     EKG Interpretation None      MDM   Final diagnoses:  Left ear pain    Head CT negative for any evidence of sinus infection. Exam of the left ear appears normal. We'll give a trial of a different  antibiotic Zithromax. Also treat with Naprosyn and pain medicine as needed. Follow up with ear nose and throat OB important. Patient without fevers. Labs without leukocytosis. No electrolyte abnormalities.  I personally performed the services described in this documentation, which was scribed in my presence. The recorded  information has been reviewed and is accurate.    Lori Mulders, MD 02/09/15 [phone]

## 2015-02-09 NOTE — ED Notes (Signed)
Patient c/o left ear ache. Per patient seen here x1 month ago for pain and diagnosed with sinus infection, given antibiotics. Patient states medication only dulled the pain-pain returned last night. Denies any drainage or fevers. Per patient ibuprofen has helped but she has to take it continuously.

## 2015-02-09 NOTE — Discharge Instructions (Signed)
Physical exam and CT scan shows no evidence of any ear infection or sinus infection. Will give another trial of a different antibiotic. Take anti-inflammatory medicine and pain medicine as needed. He can appointment to follow up with ear nose and throat. Return for any new or worse symptoms.

## 2015-05-23 ENCOUNTER — Encounter (HOSPITAL_COMMUNITY): Payer: Self-pay | Admitting: *Deleted

## 2015-05-23 ENCOUNTER — Emergency Department (HOSPITAL_COMMUNITY)
Admission: EM | Admit: 2015-05-23 | Discharge: 2015-05-23 | Disposition: A | Discharge status: Home / Self Care | Payer: Self-pay | Attending: Emergency Medicine | Admitting: Emergency Medicine

## 2015-05-23 DIAGNOSIS — Z8659 Personal history of other mental and behavioral disorders: Secondary | ICD-10-CM | POA: Insufficient documentation

## 2015-05-23 DIAGNOSIS — Z3202 Encounter for pregnancy test, result negative: Secondary | ICD-10-CM | POA: Insufficient documentation

## 2015-05-23 DIAGNOSIS — N390 Urinary tract infection, site not specified: Secondary | ICD-10-CM | POA: Insufficient documentation

## 2015-05-23 DIAGNOSIS — Z9889 Other specified postprocedural states: Secondary | ICD-10-CM | POA: Insufficient documentation

## 2015-05-23 DIAGNOSIS — I10 Essential (primary) hypertension: Secondary | ICD-10-CM | POA: Insufficient documentation

## 2015-05-23 DIAGNOSIS — Z792 Long term (current) use of antibiotics: Secondary | ICD-10-CM | POA: Insufficient documentation

## 2015-05-23 DIAGNOSIS — M545 Low back pain, unspecified: Secondary | ICD-10-CM

## 2015-05-23 LAB — URINE MICROSCOPIC-ADD ON

## 2015-05-23 LAB — URINALYSIS, ROUTINE W REFLEX MICROSCOPIC
Bilirubin Urine: NEGATIVE
Glucose, UA: NEGATIVE mg/dL
Nitrite: POSITIVE — AB
Protein, ur: NEGATIVE mg/dL
SPECIFIC GRAVITY, URINE: 1.025 (ref 1.005–1.030)
UROBILINOGEN UA: 1 mg/dL (ref 0.0–1.0)
pH: 6.5 (ref 5.0–8.0)

## 2015-05-23 LAB — POC URINE PREG, ED: Preg Test, Ur: NEGATIVE

## 2015-05-23 MED ORDER — CEPHALEXIN 500 MG PO CAPS
500.0000 mg | ORAL_CAPSULE | Freq: Once | ORAL | Status: AC
Start: 2015-05-23 — End: 2015-05-23
  Administered 2015-05-23: 500 mg via ORAL
  Filled 2015-05-23: qty 1

## 2015-05-23 MED ORDER — CEPHALEXIN 500 MG PO CAPS: 500.0000 mg | ORAL_CAPSULE | Freq: Four times a day (QID) | ORAL | Status: DC

## 2015-05-23 MED ORDER — NAPROXEN 500 MG PO TABS: 500.0000 mg | ORAL_TABLET | Freq: Two times a day (BID) | ORAL | Status: DC

## 2015-05-23 MED ORDER — NAPROXEN 250 MG PO TABS
500.0000 mg | ORAL_TABLET | Freq: Once | ORAL | Status: AC
  Administered 2015-05-23: 500 mg via ORAL
  Filled 2015-05-23: qty 2

## 2015-05-23 NOTE — ED Notes (Addendum)
Pt c/o lower back pain that started yesterday, has hx of back problems in the past, denies any injury, pain is worse with movement, certain positions, pt state that she took ibuprofen prior to arrival in er,

## 2015-05-23 NOTE — Discharge Instructions (Signed)

## 2015-05-23 NOTE — ED Provider Notes (Signed)
CSN: [id number]     Arrival date & time 05/23/15  1425 History   First MD Initiated Contact with Patient 05/23/15 1603     Chief Complaint  Patient presents with  . Back Pain     (Consider location/radiation/quality/duration/timing/severity/associated sxs/prior Treatment) HPI   Lori Huerta is a 29 y.o. female who presents to the Emergency Department complaining of diffuse lower back pain that began yesterday.  Patient reports history of recurrent lower back pain and typically takes Mobic for her symptoms, but has not taken this medication for several months.  She states the pain began gradually and is worsened with movement and bending.  Improves with rest.  She denies urine or bowel changes, numbness or weakness of the lower extremities or abdominal pain.  She took ibuprofen with moderate relief prior to ED arrival.     Past Medical History  Diagnosis Date  . Bipolar 1 disorder   . Anxiety   . HYPERTENSION 12/07/2010    only with past pregnancy  . Kidney stones    Past Surgical History  Procedure Laterality Date  . Cesarean section    . Laparoscopy  02/04/2013    Procedure: LAPAROSCOPY OPERATIVE;  Surgeon: Allie Bossier, MD;  Location: [facility];  Service: Gynecology;  Laterality: N/A;  . Bilateral salpingectomy  02/04/2013    Procedure: BILATERAL SALPINGECTOMY;  Surgeon: Allie Bossier, MD;  Location: [facility];  Service: Gynecology;  Laterality: N/A;   Family History  Problem Relation Age of Onset  . Cancer Mother    History  Substance Use Topics  . Smoking status: Never Smoker   . Smokeless tobacco: Never Used  . Alcohol Use: Yes     Comment: occasionally   OB History    Gravida Para Term Preterm AB TAB SAB Ectopic Multiple Living   Review of Systems  Constitutional: Negative for fever.  Respiratory: Negative for shortness of breath.   Gastrointestinal: Negative for vomiting, abdominal pain and constipation.  Genitourinary: Negative for dysuria,  hematuria, flank pain, decreased urine volume and difficulty urinating.  Musculoskeletal: Positive for back pain. Negative for joint swelling.  Skin: Negative for rash.  Neurological: Negative for weakness and numbness.  All other systems reviewed and are negative.     Allergies  Review of patient's allergies indicates no known allergies.  Home Medications   Prior to Admission medications   Medication Sig Start Date End Date Taking? Authorizing Provider  amoxicillin (AMOXIL) 500 MG capsule Take 1 capsule (500 mg total) by mouth 3 (three) times daily. Patient not taking: Reported on 02/09/2015 01/12/15   Zadie Rhine, MD  azithromycin (ZITHROMAX) 250 MG tablet Take 1 tablet (250 mg total) by mouth daily. Take first 2 tablets together, then 1 every day until finished. Patient not taking: Reported on 05/23/2015 02/09/15   Vanetta Mulders, MD  HYDROcodone-acetaminophen (NORCO/VICODIN) 5-325 MG per tablet Take 1-2 tablets by mouth every 6 (six) hours as needed. Patient not taking: Reported on 05/23/2015 02/09/15   Vanetta Mulders, MD  naproxen (NAPROSYN) 500 MG tablet Take 1 tablet (500 mg total) by mouth 2 (two) times daily. Patient not taking: Reported on 05/23/2015 02/09/15   Vanetta Mulders, MD   BP 113/77 mmHg  Pulse 97  Temp(Src) 98.4 F (36.9 C) (Oral)  Resp 18  Ht  (1.676 m)  Wt 135 lb (61.236 kg)  BMI 21.80 kg/m2  SpO2 100%  LMP 05/01/2015  Physical Exam  Constitutional: She is oriented to person, place, and time. She appears well-developed and well-nourished. No distress.  HENT:  Head: Normocephalic and atraumatic.  Neck: Normal range of motion. Neck supple.  Cardiovascular: Normal rate, regular rhythm, normal heart sounds and intact distal pulses.   No murmur heard. Pulmonary/Chest: Effort normal and breath sounds normal. No respiratory distress.  Abdominal: Soft. She exhibits no distension. There is no tenderness. There is no rebound and no CVA tenderness.   Musculoskeletal: She exhibits tenderness. She exhibits no edema.       Lumbar back: She exhibits tenderness and pain. She exhibits normal range of motion, no swelling, no deformity, no laceration and normal pulse.  ttp of the bilateral lumbar spine and paraspinal muscles.    DP pulses are brisk and symmetrical.  Distal sensation intact.  Hip Flexors/Extensors are intact.  Pt has 5/5 strength against resistance of bilateral lower extremities.     Neurological: She is alert and oriented to person, place, and time. She has normal strength. No sensory deficit. She exhibits normal muscle tone. Coordination and gait normal.  Reflex Scores:      Patellar reflexes are 2+ on the right side and 2+ on the left side.      Achilles reflexes are 2+ on the right side and 2+ on the left side. Skin: Skin is warm and dry. No rash noted.  Nursing note and vitals reviewed.   ED Course  Procedures (including critical care time) Labs Review Labs Reviewed  URINALYSIS, ROUTINE W REFLEX MICROSCOPIC - Abnormal; Notable for the following:    APPearance CLOUDY (*)    Hgb urine dipstick SMALL (*)    Ketones, ur TRACE (*)    Nitrite POSITIVE (*)    Leukocytes, UA SMALL (*)    All other components within normal limits  URINE MICROSCOPIC-ADD ON - Abnormal; Notable for the following:    Bacteria, UA MANY (*)    All other components within normal limits  URINE CULTURE  POC URINE PREG, ED    Imaging Review No results found.   EKG Interpretation None       Urine culture pending  MDM   Final diagnoses:  Bilateral low back pain without sciatica  Urinary tract infection without complication    Pt is ambulatory, no focal neuro deficits on exam.  Diffuse lower back pain.  Exam is reassuring.  U/A shows UTI.   Pt agrees to symptomatic tx with naprosyn and keflex and close PMD f/u.  No concerning sx for pyelonephritis.    Previous urine cultures grew staph aureus that was pan sensitive.    Pauline Ausammy Cynitha Berte,  PA-C 05/23/15 2333  Gilda Creasehristopher J Pollina, MD 05/26/15 [phone]

## 2015-05-26 LAB — URINE CULTURE: Colony Count: >=100000

## 2015-05-27 ENCOUNTER — Telehealth (HOSPITAL_BASED_OUTPATIENT_CLINIC_OR_DEPARTMENT_OTHER): Payer: Self-pay | Admitting: Emergency Medicine

## 2015-05-27 ENCOUNTER — Emergency Department (HOSPITAL_COMMUNITY): Payer: Self-pay

## 2015-05-27 ENCOUNTER — Emergency Department (HOSPITAL_COMMUNITY)
Admission: EM | Admit: 2015-05-27 | Discharge: 2015-05-27 | Disposition: A | Discharge status: Home / Self Care | Payer: Self-pay | Attending: Emergency Medicine | Admitting: Emergency Medicine

## 2015-05-27 ENCOUNTER — Encounter (HOSPITAL_COMMUNITY): Payer: Self-pay | Admitting: *Deleted

## 2015-05-27 DIAGNOSIS — N390 Urinary tract infection, site not specified: Secondary | ICD-10-CM | POA: Insufficient documentation

## 2015-05-27 DIAGNOSIS — M549 Dorsalgia, unspecified: Secondary | ICD-10-CM

## 2015-05-27 DIAGNOSIS — Z9889 Other specified postprocedural states: Secondary | ICD-10-CM | POA: Insufficient documentation

## 2015-05-27 DIAGNOSIS — Z87442 Personal history of urinary calculi: Secondary | ICD-10-CM | POA: Insufficient documentation

## 2015-05-27 DIAGNOSIS — Z792 Long term (current) use of antibiotics: Secondary | ICD-10-CM | POA: Insufficient documentation

## 2015-05-27 DIAGNOSIS — M545 Low back pain: Secondary | ICD-10-CM | POA: Insufficient documentation

## 2015-05-27 DIAGNOSIS — Z791 Long term (current) use of non-steroidal anti-inflammatories (NSAID): Secondary | ICD-10-CM | POA: Insufficient documentation

## 2015-05-27 DIAGNOSIS — Z8659 Personal history of other mental and behavioral disorders: Secondary | ICD-10-CM | POA: Insufficient documentation

## 2015-05-27 DIAGNOSIS — I10 Essential (primary) hypertension: Secondary | ICD-10-CM | POA: Insufficient documentation

## 2015-05-27 LAB — CBC WITH DIFFERENTIAL/PLATELET
Basophils Absolute: 0 10*3/uL (ref 0.0–0.1)
Basophils Relative: 0 % (ref 0–1)
Eosinophils Absolute: 0.1 10*3/uL (ref 0.0–0.7)
Eosinophils Relative: 1 % (ref 0–5)
HCT: 34.4 % — ABNORMAL LOW (ref 36.0–46.0)
Hemoglobin: 12 g/dL (ref 12.0–15.0)
LYMPHS ABS: 1.9 10*3/uL (ref 0.7–4.0)
LYMPHS PCT: 34 % (ref 12–46)
MCH: 31.4 pg (ref 26.0–34.0)
MCHC: 34.9 g/dL (ref 30.0–36.0)
MCV: 90.1 fL (ref 78.0–100.0)
MONOS PCT: 4 % (ref 3–12)
Monocytes Absolute: 0.2 10*3/uL (ref 0.1–1.0)
Neutro Abs: 3.4 10*3/uL (ref 1.7–7.7)
Neutrophils Relative %: 61 % (ref 43–77)
Platelets: 255 10*3/uL (ref 150–400)
RBC: 3.82 MIL/uL — ABNORMAL LOW (ref 3.87–5.11)
RDW: 12.8 % (ref 11.5–15.5)
WBC: 5.5 10*3/uL (ref 4.0–10.5)

## 2015-05-27 LAB — COMPREHENSIVE METABOLIC PANEL
ALT: 9 U/L — AB (ref 14–54)
ANION GAP: 10 (ref 5–15)
AST: 15 U/L (ref 15–41)
Albumin: 4.3 g/dL (ref 3.5–5.0)
Alkaline Phosphatase: 59 U/L (ref 38–126)
BUN: 14 mg/dL (ref 6–20)
CHLORIDE: 105 mmol/L (ref 101–111)
CO2: 22 mmol/L (ref 22–32)
CREATININE: 0.63 mg/dL (ref 0.44–1.00)
Calcium: 8.8 mg/dL — ABNORMAL LOW (ref 8.9–10.3)
GFR calc Af Amer: >60 mL/min (ref >60)
Glucose, Bld: 78 mg/dL (ref 65–99)
POTASSIUM: 3.5 mmol/L (ref 3.5–5.1)
Sodium: 137 mmol/L (ref 135–145)
TOTAL PROTEIN: 7.7 g/dL (ref 6.5–8.1)
Total Bilirubin: 0.5 mg/dL (ref 0.3–1.2)

## 2015-05-27 MED ORDER — NAPROXEN 250 MG PO TABS
500.0000 mg | ORAL_TABLET | Freq: Once | ORAL | Status: AC
  Administered 2015-05-27: 500 mg via ORAL
  Filled 2015-05-27: qty 2

## 2015-05-27 NOTE — Progress Notes (Signed)
ED Antimicrobial Stewardship Positive Culture Follow Up   Lori Huerta is an 29 y.o. female who [facility] with a chief complaint of  Chief Complaint  Patient presents with  . Back Pain    Recent Results (from the past 720 hour(s))  Urine culture     Status: None   Collection Time: 05/23/15  4:14 PM  Result Value Ref Range Status   Specimen Description URINE, CLEAN CATCH  Final   Special Requests NONE  Final   Colony Count   Final    >=100,000 COLONIES/ML Performed at [facility]    Culture   Final    STAPHYLOCOCCUS AUREUS Note: RIFAMPIN AND GENTAMICIN SHOULD NOT BE USED AS SINGLE DRUGS FOR TREATMENT OF STAPH INFECTIONS. Performed at [facility]    Report Status 05/26/2015 FINAL  Final   Organism ID, Bacteria STAPHYLOCOCCUS AUREUS  Final      Susceptibility   Staphylococcus aureus - MIC*    GENTAMICIN <=0.5 SENSITIVE Sensitive     LEVOFLOXACIN 1 SENSITIVE Sensitive     NITROFURANTOIN <=16 SENSITIVE Sensitive     OXACILLIN <=0.25 SENSITIVE Sensitive     PENICILLIN 0.06 SENSITIVE Sensitive     RIFAMPIN <=0.5 SENSITIVE Sensitive     TRIMETH/SULFA <=10 SENSITIVE Sensitive     VANCOMYCIN <=0.5 SENSITIVE Sensitive     TETRACYCLINE <=1 SENSITIVE Sensitive     * STAPHYLOCOCCUS AUREUS    Staph aureus in the urine is not a common finding and may be indicative of Staph aureus infection elsewhere in the body.  Her chief complaint is back pain.  It is interesting that she had Staph aureus in her urine and back pain in January 2015 (treated as a UTI with Septra).  Case discussed with Dr. Ninetta LightsHatcher with Infectious Diseases.  He recommends some additional workup, including:  Blood cultures  MR of Back  Renal ultrasound  Flow manager will contact patient to ask her to return to the Emergency Department for additional workup.  ED Provider: Jaynie Crumbleatyana Kirichenko, PA-C   Sallee Provencalurner, Sohail Capraro S 05/27/2015, 10:44 AM Infectious Diseases  Pharmacist Phone# [phone]

## 2015-05-27 NOTE — ED Notes (Signed)
Dr Adriana Simasook in prior to RN, see edp assessment for further evaluation,

## 2015-05-27 NOTE — ED Notes (Signed)
Pt states that after arrival to er she was pain free, starting to have "aching" in lower back area, Dr Adriana Simasook notified,

## 2015-05-27 NOTE — ED Notes (Signed)
Pt seen on 05/23/15 for back pain, pt called today by Berle MullLynn Miller RN per Jaynie Crumbleatyana Kirichenko PA and instructed to return for blood cultures, MRI of the back, and a renal US.

## 2015-05-27 NOTE — Discharge Instructions (Signed)
Tests were normal. Follow-up your primary care doctor. Continue antibiotic. Increase fluids.

## 2015-05-27 NOTE — Telephone Encounter (Signed)
Per Jaynie Crumbleatyana Kirichenko PA , rsults of urine culture with >100,000 colonies Staphylococcus, treated with cephalexin , informed pt after discussion with Dr. Ninetta LightsHatcher, pt told to return to ED for 1) blood cultures 2) MRI back 3) Renal ultrasound  Patient aware of need to return to ED for reeval, states will return when she gets off from work this evening.

## 2015-05-27 NOTE — ED Provider Notes (Signed)
CSN: [id number]     Arrival date & time 05/27/15  1455 History   First MD Initiated Contact with Patient 05/27/15 1610     Chief Complaint  Patient presents with  . Abnormal Lab     (Consider location/radiation/quality/duration/timing/severity/associated sxs/prior Treatment) HPI.... Patient was evaluated on 05/23/2015 for an uncomplicated urinary tract infection. She was placed on Keflex and discharged home. Urine culture grew out staph aureus. This was discussed with the pharmacist who then discussed the clinical scenario with Dr. Ninetta LightsHatcher, infectious disease. It was recommended that patient obtain blood cultures, MRI of lower spine, renal ultrasound.  He is afebrile. No clinical evidence of pyelonephritis.  Past Medical History  Diagnosis Date  . Bipolar 1 disorder   . Anxiety   . HYPERTENSION 12/07/2010    only with past pregnancy  . Kidney stones    Past Surgical History  Procedure Laterality Date  . Cesarean section    . Laparoscopy  02/04/2013    Procedure: LAPAROSCOPY OPERATIVE;  Surgeon: Allie BossierMyra C Dove, MD;  Location: [facility];  Service: Gynecology;  Laterality: N/A;  . Bilateral salpingectomy  02/04/2013    Procedure: BILATERAL SALPINGECTOMY;  Surgeon: Allie BossierMyra C Dove, MD;  Location: [facility];  Service: Gynecology;  Laterality: N/A;   Family History  Problem Relation Age of Onset  . Cancer Mother    History  Substance Use Topics  . Smoking status: Never Smoker   . Smokeless tobacco: Never Used  . Alcohol Use: Yes     Comment: occasionally   OB History    Gravida Para Term Preterm AB TAB SAB Ectopic Multiple Living   3 2 2  1  1   2      Review of Systems  All other systems reviewed and are negative.     Allergies  Review of patient's allergies indicates no known allergies.  Home Medications   Prior to Admission medications   Medication Sig Start Date End Date Taking? Authorizing Provider  cephALEXin (KEFLEX) 500 MG capsule Take 1 capsule (500 mg total) by mouth 4  (four) times daily. For 7 days 05/23/15  Yes Tammy Triplett, PA-C  naproxen (NAPROSYN) 500 MG tablet Take 1 tablet (500 mg total) by mouth 2 (two) times daily with a meal. 05/23/15  Yes Tammy Triplett, PA-C   BP 139/93 mmHg  Pulse 101  Temp(Src) 98.8 F (37.1 C) (Oral)  Resp 14  Ht 5\' 6"  (1.676 m)  Wt 140 lb (63.504 kg)  BMI 22.61 kg/m2  SpO2 100%  LMP 05/01/2015 Physical Exam  Constitutional: She is oriented to person, place, and time. She appears well-developed and well-nourished.  HENT:  Head: Normocephalic and atraumatic.  Eyes: Conjunctivae and EOM are normal. Pupils are equal, round, and reactive to light.  Neck: Normal range of motion. Neck supple.  Cardiovascular: Normal rate and regular rhythm.   Pulmonary/Chest: Effort normal and breath sounds normal.  Abdominal: Soft. Bowel sounds are normal.  Genitourinary:  No CVAT  Musculoskeletal: Normal range of motion.  Neurological: She is alert and oriented to person, place, and time.  Skin: Skin is warm and dry.  Psychiatric: She has a normal mood and affect. Her behavior is normal.  Nursing note and vitals reviewed.   ED Course  Procedures (including critical care time) Labs Review Labs Reviewed  CBC WITH DIFFERENTIAL/PLATELET - Abnormal; Notable for the following:    RBC 3.82 (*)    HCT 34.4 (*)    All other components within normal limits  COMPREHENSIVE  METABOLIC PANEL - Abnormal; Notable for the following:    Calcium 8.8 (*)    ALT 9 (*)    All other components within normal limits  CULTURE, BLOOD (ROUTINE X 2)  CULTURE, BLOOD (ROUTINE X 2)    Imaging Review Mr Lumbar Spine Wo Contrast  05/27/2015   CLINICAL DATA:  Patient complains of low back pain for the past week without a known injury.  EXAM: MRI LUMBAR SPINE WITHOUT CONTRAST  TECHNIQUE: Multiplanar, multisequence MR imaging of the lumbar spine was performed. No intravenous contrast was administered.  COMPARISON:  None.  FINDINGS: Segmentation: Normal.   Alignment:  Normal.  Vertebrae: No worrisome osseous lesion.  Conus medullaris: Normal in size, signal, and location.  Paraspinal tissues: No evidence for hydronephrosis or paravertebral mass.  Disc levels:  No disc protrusion or spinal stenosis. No neural impingement at any level. No pars defects.  Unremarkable appearing SI joints.  IMPRESSION: Normal lumbar spine MRI.   Electronically Signed   By: Davonna Belling M.D.   On: 05/27/2015 19:58   US Renal  05/27/2015   CLINICAL DATA:  Elevated renal function tests  EXAM: RENAL / URINARY TRACT ULTRASOUND COMPLETE  COMPARISON:  None.  FINDINGS: Right Kidney:  Length: 10.6 cm. Echogenicity within normal limits. No mass or hydronephrosis visualized.  Left Kidney:  Length: 10.9 cm. Echogenicity within normal limits. No mass or hydronephrosis visualized.  Bladder:  Appears normal for degree of bladder distention.  IMPRESSION: No acute abnormality noted.   Electronically Signed   By: Alcide Clever M.D.   On: 05/27/2015 17:25     EKG Interpretation None      MDM   Final diagnoses:  Back pain  UTI (lower urinary tract infection)    Patient is nontoxic-appearing. White count normal. Ultrasound of kidneys negative. MRI of lumbar spine negative. Continue Keflex. Results of test were discussed with patient    Donnetta Hutching, MD 05/27/15 2023

## 2015-06-01 LAB — CULTURE, BLOOD (ROUTINE X 2)
Culture: NO GROWTH
Culture: NO GROWTH

## 2015-12-14 ENCOUNTER — Emergency Department (HOSPITAL_COMMUNITY)
Admission: EM | Admit: 2015-12-14 | Discharge: 2015-12-14 | Disposition: A | Discharge status: Home / Self Care | Payer: Self-pay | Attending: Emergency Medicine | Admitting: Emergency Medicine

## 2015-12-14 ENCOUNTER — Encounter (HOSPITAL_COMMUNITY): Payer: Self-pay | Admitting: *Deleted

## 2015-12-14 DIAGNOSIS — J029 Acute pharyngitis, unspecified: Secondary | ICD-10-CM | POA: Insufficient documentation

## 2015-12-14 DIAGNOSIS — Z8659 Personal history of other mental and behavioral disorders: Secondary | ICD-10-CM | POA: Insufficient documentation

## 2015-12-14 DIAGNOSIS — I10 Essential (primary) hypertension: Secondary | ICD-10-CM | POA: Insufficient documentation

## 2015-12-14 DIAGNOSIS — Z87442 Personal history of urinary calculi: Secondary | ICD-10-CM | POA: Insufficient documentation

## 2015-12-14 MED ORDER — AMOXICILLIN 500 MG PO CAPS: 500.0000 mg | ORAL_CAPSULE | Freq: Three times a day (TID) | ORAL | Status: DC

## 2015-12-14 NOTE — Discharge Instructions (Signed)

## 2015-12-14 NOTE — ED Notes (Signed)
Pt. C/o sore throat and congestion x 2 days. Denies fevers. Denies n/v/d.

## 2015-12-14 NOTE — ED Provider Notes (Signed)
CSN: [id number]     Arrival date & time 12/14/15  0957 History   First MD Initiated Contact with Patient 12/14/15 1023     Chief Complaint  Patient presents with  . Sore Throat     (Consider location/radiation/quality/duration/timing/severity/associated sxs/prior Treatment) Patient is a 29 y.o. female presenting with pharyngitis. The history is provided by the patient. No language interpreter was used.  Sore Throat This is a new problem. The current episode started yesterday. The problem occurs constantly. The problem has been gradually worsening. Associated symptoms include a sore throat. Nothing aggravates the symptoms. She has tried nothing for the symptoms. The treatment provided moderate relief.    Past Medical History  Diagnosis Date  . Bipolar 1 disorder (HCC)   . Anxiety   . HYPERTENSION 12/07/2010    only with past pregnancy  . Kidney stones    Past Surgical History  Procedure Laterality Date  . Cesarean section    . Laparoscopy  02/04/2013    Procedure: LAPAROSCOPY OPERATIVE;  Surgeon: Allie BossierMyra C Dove, MD;  Location: [facility];  Service: Gynecology;  Laterality: N/A;  . Bilateral salpingectomy  02/04/2013    Procedure: BILATERAL SALPINGECTOMY;  Surgeon: Allie BossierMyra C Dove, MD;  Location: [facility];  Service: Gynecology;  Laterality: N/A;   Family History  Problem Relation Age of Onset  . Cancer Mother    Social History  Substance Use Topics  . Smoking status: Never Smoker   . Smokeless tobacco: Never Used  . Alcohol Use: Yes     Comment: occasionally   OB History    Gravida Para Term Preterm AB TAB SAB Ectopic Multiple Living   3 2 2  1  1   2      Review of Systems  HENT: Positive for sore throat.   All other systems reviewed and are negative.     Allergies  Review of patient's allergies indicates no known allergies.  Home Medications   Prior to Admission medications   Not on File   BP 117/81 mmHg  Pulse 83  Temp(Src) 97.8 F (36.6 C) (Oral)  Resp 16  Ht 5\' 6"   (1.676 m)  Wt 63.504 kg  BMI 22.61 kg/m2  SpO2 100%  LMP 12/07/2015 Physical Exam  Constitutional: She is oriented to person, place, and time. She appears well-developed and well-nourished.  HENT:  Head: Normocephalic and atraumatic.  Swollen tonsils, no exudate  Eyes: Conjunctivae and EOM are normal. Pupils are equal, round, and reactive to light.  Neck: Normal range of motion.  Cardiovascular: Normal rate and normal heart sounds.   Pulmonary/Chest: Effort normal.  Abdominal: Soft. She exhibits no distension.  Musculoskeletal: Normal range of motion.  Neurological: She is alert and oriented to person, place, and time.  Psychiatric: She has a normal mood and affect.  Nursing note and vitals reviewed.   ED Course  Procedures (including critical care time) Labs Review Labs Reviewed - No data to display  Imaging Review No results found. I have personally reviewed and evaluated these images and lab results as part of my medical decision-making.   EKG Interpretation None      MDM   Final diagnoses:  Pharyngitis    amoxicillian An After Visit Summary was printed and given to the patient.    Lonia SkinnerLeslie K CactusSofia, PA-C 12/14/15 1448  Bethann BerkshireJoseph Zammit, MD 12/15/15 [phone]

## 2016-01-26 ENCOUNTER — Encounter (HOSPITAL_COMMUNITY): Payer: Self-pay | Admitting: *Deleted

## 2016-01-26 ENCOUNTER — Emergency Department (HOSPITAL_COMMUNITY)
Admission: EM | Admit: 2016-01-26 | Discharge: 2016-01-26 | Disposition: A | Discharge status: Home / Self Care | Payer: Self-pay | Attending: Emergency Medicine | Admitting: Emergency Medicine

## 2016-01-26 DIAGNOSIS — Z87442 Personal history of urinary calculi: Secondary | ICD-10-CM | POA: Insufficient documentation

## 2016-01-26 DIAGNOSIS — J029 Acute pharyngitis, unspecified: Secondary | ICD-10-CM | POA: Insufficient documentation

## 2016-01-26 DIAGNOSIS — Z8659 Personal history of other mental and behavioral disorders: Secondary | ICD-10-CM | POA: Insufficient documentation

## 2016-01-26 LAB — RAPID STREP SCREEN (MED CTR MEBANE ONLY): STREPTOCOCCUS, GROUP A SCREEN (DIRECT): NEGATIVE

## 2016-01-26 MED ORDER — MAGIC MOUTHWASH W/LIDOCAINE: 5.0000 mL | Freq: Three times a day (TID) | ORAL | Status: DC | PRN

## 2016-01-26 NOTE — Discharge Instructions (Signed)

## 2016-01-26 NOTE — ED Notes (Signed)
Pt comes in with sore throat starting 2 days ago. Pt denies emesis or diarrhea.

## 2016-01-27 NOTE — ED Provider Notes (Signed)
CSN: [id number]     Arrival date & time 01/26/16  1440 History   First MD Initiated Contact with Patient 01/26/16 1511     Chief Complaint  Patient presents with  . Sore Throat     (Consider location/radiation/quality/duration/timing/severity/associated sxs/prior Treatment) HPI   Lori Huerta is a 30 y.o. female who presents to the Emergency Department complaining of sore throat for 2 days.  She describes a pain to the back of her throat that's worse with swallowing.  She denies associated symptoms.  Specifically, she denies fever, abd pain, rash, cough,  And neck pain.  She has tried OTC throat lozenges.     Past Medical History  Diagnosis Date  . Bipolar 1 disorder (HCC)   . Anxiety   . HYPERTENSION 12/07/2010    only with past pregnancy  . Kidney stones    Past Surgical History  Procedure Laterality Date  . Cesarean section    . Laparoscopy  02/04/2013    Procedure: LAPAROSCOPY OPERATIVE;  Surgeon: Allie Bossier, MD;  Location: [facility];  Service: Gynecology;  Laterality: N/A;  . Bilateral salpingectomy  02/04/2013    Procedure: BILATERAL SALPINGECTOMY;  Surgeon: Allie Bossier, MD;  Location: [facility];  Service: Gynecology;  Laterality: N/A;   Family History  Problem Relation Age of Onset  . Cancer Mother    Social History  Substance Use Topics  . Smoking status: Never Smoker   . Smokeless tobacco: Never Used  . Alcohol Use: Yes     Comment: occasionally   OB History    Gravida Para Term Preterm AB TAB SAB Ectopic Multiple Living   Review of Systems  Constitutional: Negative for fever, chills, activity change and appetite change.  HENT: Positive for sore throat. Negative for congestion, ear pain, facial swelling, trouble swallowing and voice change.   Eyes: Negative for pain and visual disturbance.  Respiratory: Negative for cough and shortness of breath.   Gastrointestinal: Negative for nausea, vomiting and abdominal pain.  Musculoskeletal: Negative  for arthralgias, neck pain and neck stiffness.  Skin: Negative for color change and rash.  Neurological: Negative for dizziness, facial asymmetry, speech difficulty, numbness and headaches.  Hematological: Negative for adenopathy.  All other systems reviewed and are negative.     Allergies  Review of patient's allergies indicates no known allergies.  Home Medications   Prior to Admission medications   Medication Sig Start Date End Date Taking? Authorizing Provider  amoxicillin (AMOXIL) 500 MG capsule Take 1 capsule (500 mg total) by mouth 3 (three) times daily. Patient not taking: Reported on 01/26/2016 12/14/15   Elson Areas, PA-C  magic mouthwash w/lidocaine SOLN Take 5 mLs by mouth 3 (three) times daily as needed for mouth pain. Swish and spit, do not swallow 01/26/16   Marcina Kinnison, PA-C   BP 127/91 mmHg  Pulse 86  Temp(Src) 98.6 F (37 C) (Oral)  Resp 16  Ht  (1.676 m)  Wt 61.236 kg  BMI 21.80 kg/m2  SpO2 100%  LMP 01/12/2016 Physical Exam  Constitutional: She is oriented to person, place, and time. She appears well-developed and well-nourished. No distress.  HENT:  Head: Normocephalic and atraumatic.  Right Ear: Tympanic membrane and ear canal normal.  Left Ear: Tympanic membrane and ear canal normal.  Mouth/Throat: Uvula is midline and mucous membranes are normal. No trismus in the jaw. No uvula swelling. Posterior oropharyngeal erythema  present. No oropharyngeal exudate, posterior oropharyngeal edema or tonsillar abscesses.  Neck: Normal range of motion. Neck supple.  Cardiovascular: Normal rate and regular rhythm.   No murmur heard. Pulmonary/Chest: Effort normal and breath sounds normal. No respiratory distress.  Abdominal: Soft. She exhibits no distension. There is no splenomegaly. There is no tenderness.  Musculoskeletal: Normal range of motion.  Lymphadenopathy:    She has no cervical adenopathy.  Neurological: She is alert and oriented to person,  place, and time. She exhibits normal muscle tone. Coordination normal.  Skin: Skin is warm and dry.  Nursing note and vitals reviewed.   ED Course  Procedures (including critical care time) Labs Review Labs Reviewed  RAPID STREP SCREEN (NOT AT [facility])  CULTURE, GROUP A STREP [facility])    Imaging Review No results found. I have personally reviewed and evaluated these images and lab results as part of my medical decision-making.   EKG Interpretation None      MDM   Final diagnoses:  Pharyngitis   Strep screen neg.  Airway patent.  Vitals stable and no concerning sx's for peritonsillar abscess.  Sx's likely viral.  Rx for magic mouthwash and she agrees to tylenol or ibuprofen if needed.  She appears stable for d/c     Pauline Aus, PA-C 01/27/16 2158  Linwood Dibbles, MD 01/28/16 [phone]

## 2016-01-29 LAB — CULTURE, GROUP A STREP (THRC)

## 2016-03-16 ENCOUNTER — Emergency Department (HOSPITAL_COMMUNITY)
Admission: EM | Admit: 2016-03-16 | Discharge: 2016-03-16 | Disposition: A | Discharge status: Home / Self Care | Payer: Self-pay | Attending: Emergency Medicine | Admitting: Emergency Medicine

## 2016-03-16 ENCOUNTER — Encounter (HOSPITAL_COMMUNITY): Payer: Self-pay | Admitting: Emergency Medicine

## 2016-03-16 DIAGNOSIS — R05 Cough: Secondary | ICD-10-CM

## 2016-03-16 DIAGNOSIS — I10 Essential (primary) hypertension: Secondary | ICD-10-CM | POA: Insufficient documentation

## 2016-03-16 DIAGNOSIS — J069 Acute upper respiratory infection, unspecified: Secondary | ICD-10-CM | POA: Insufficient documentation

## 2016-03-16 DIAGNOSIS — R059 Cough, unspecified: Secondary | ICD-10-CM

## 2016-03-16 DIAGNOSIS — F319 Bipolar disorder, unspecified: Secondary | ICD-10-CM | POA: Insufficient documentation

## 2016-03-16 MED ORDER — HYDROCODONE-HOMATROPINE 5-1.5 MG/5ML PO SYRP: 5.0000 mL | ORAL_SOLUTION | Freq: Four times a day (QID) | ORAL | Status: DC | PRN

## 2016-03-16 MED ORDER — AZITHROMYCIN 250 MG PO TABS: ORAL_TABLET | ORAL | Status: DC

## 2016-03-16 NOTE — ED Notes (Signed)
Pt reports cough x2 weeks non-productive, has been taking robitussin with relief.

## 2016-03-16 NOTE — Discharge Instructions (Signed)

## 2016-03-17 NOTE — ED Provider Notes (Signed)
CSN: [id number]     Arrival date & time 03/16/16  1404 History   First MD Initiated Contact with Patient 03/16/16 1415     Chief Complaint  Patient presents with  . Cough     (Consider location/radiation/quality/duration/timing/severity/associated sxs/prior Treatment) HPI   Lori Huerta is a 30 y.o. female who presents to the Emergency Department complaining of persistent cough for 2 weeks.  Cough has been mostly non-productive but occasionally produces colored sputum.  Cough is worse at night.  She has taken Robutussin with improvement initially but now not helping as much.  Cough has interfered with sleeping.  She denies shortness of breath, chest pain, abd pain, vomiting, diarrhea.     Past Medical History  Diagnosis Date  . Bipolar 1 disorder (HCC)   . Anxiety   . HYPERTENSION 12/07/2010    only with past pregnancy  . Kidney stones    Past Surgical History  Procedure Laterality Date  . Cesarean section    . Laparoscopy  02/04/2013    Procedure: LAPAROSCOPY OPERATIVE;  Surgeon: Allie BossierMyra C Dove, MD;  Location: [facility];  Service: Gynecology;  Laterality: N/A;  . Bilateral salpingectomy  02/04/2013    Procedure: BILATERAL SALPINGECTOMY;  Surgeon: Allie BossierMyra C Dove, MD;  Location: [facility];  Service: Gynecology;  Laterality: N/A;   Family History  Problem Relation Age of Onset  . Cancer Mother    Social History  Substance Use Topics  . Smoking status: Never Smoker   . Smokeless tobacco: Never Used  . Alcohol Use: Yes     Comment: occasionally   OB History    Gravida Para Term Preterm AB TAB SAB Ectopic Multiple Living   3 2 2  1  1   2      Review of Systems  Constitutional: Negative for fever, chills, activity change and appetite change.  HENT: Positive for congestion and rhinorrhea. Negative for facial swelling, sore throat and trouble swallowing.   Eyes: Negative for visual disturbance.  Respiratory: Positive for cough. Negative for shortness of breath, wheezing and stridor.    Cardiovascular: Negative for chest pain.  Gastrointestinal: Negative for nausea, vomiting and abdominal pain.  Musculoskeletal: Negative for neck pain and neck stiffness.  Skin: Negative.  Negative for rash.  Neurological: Negative for dizziness, weakness, numbness and headaches.  Hematological: Negative for adenopathy.  Psychiatric/Behavioral: Negative for confusion.  All other systems reviewed and are negative.     Allergies  Review of patient's allergies indicates no known allergies.  Home Medications   Prior to Admission medications   Medication Sig Start Date End Date Taking? Authorizing Provider  amoxicillin (AMOXIL) 500 MG capsule Take 1 capsule (500 mg total) by mouth 3 (three) times daily. Patient not taking: Reported on 01/26/2016 12/14/15   Elson AreasLeslie K Sofia, PA-C  azithromycin (ZITHROMAX) 250 MG tablet Take first 2 tablets together, then 1 every day until finished. 03/16/16   Kennady Zimmerle, PA-C  HYDROcodone-homatropine (HYCODAN) 5-1.5 MG/5ML syrup Take 5 mLs by mouth every 6 (six) hours as needed for cough. 03/16/16   Evelyn Moch, PA-C  magic mouthwash w/lidocaine SOLN Take 5 mLs by mouth 3 (three) times daily as needed for mouth pain. Swish and spit, do not swallow 01/26/16   Merrik Puebla, PA-C   BP 125/86 mmHg  Pulse 77  Temp(Src) 98.3 F (36.8 C) (Oral)  Resp 18  Ht 5\' 6"  (1.676 m)  Wt 61.236 kg  BMI 21.80 kg/m2  SpO2 100%  LMP 03/11/2016 (Approximate) Physical Exam  Constitutional: She is oriented to person, place, and time. She appears well-developed and well-nourished. No distress.  HENT:  Head: Normocephalic and atraumatic.  Right Ear: Tympanic membrane and ear canal normal.  Left Ear: Tympanic membrane and ear canal normal.  Mouth/Throat: Uvula is midline, oropharynx is clear and moist and mucous membranes are normal. No oropharyngeal exudate.  Eyes: EOM are normal. Pupils are equal, round, and reactive to light.  Neck: Normal range of motion, full  passive range of motion without pain and phonation normal. Neck supple.  Cardiovascular: Normal rate, regular rhythm, normal heart sounds and intact distal pulses.   No murmur heard. Pulmonary/Chest: Effort normal. No stridor. No respiratory distress. She has no wheezes. She has no rales. She exhibits no tenderness.  Coarse lungs sounds bilaterally.    Abdominal: Soft. She exhibits no distension. There is no tenderness.  Musculoskeletal: She exhibits no edema.  Lymphadenopathy:    She has no cervical adenopathy.  Neurological: She is alert and oriented to person, place, and time. She exhibits normal muscle tone. Coordination normal.  Skin: Skin is warm and dry.  Nursing note and vitals reviewed.   ED Course  Procedures (including critical care time) Labs Review Labs Reviewed - No data to display  Imaging Review No results found. I have personally reviewed and evaluated these images and lab results as part of my medical decision-making.   EKG Interpretation None      MDM   Final diagnoses:  Cough  URI (upper respiratory infection)    Pt is well appearing.  Non-toxic. Vitals stable.  She agrees to tx plan.  Appears stable for d/c and agrees to tx plan.         Pauline Aus, PA-C 03/17/16 2112  Samuel Jester, DO 03/21/16 1745

## 2016-07-21 ENCOUNTER — Encounter (HOSPITAL_COMMUNITY): Payer: Self-pay | Admitting: Emergency Medicine

## 2016-07-21 ENCOUNTER — Emergency Department (HOSPITAL_COMMUNITY)
Admission: EM | Admit: 2016-07-21 | Discharge: 2016-07-21 | Disposition: A | Discharge status: Home / Self Care | Payer: Self-pay | Attending: Emergency Medicine | Admitting: Emergency Medicine

## 2016-07-21 DIAGNOSIS — J069 Acute upper respiratory infection, unspecified: Secondary | ICD-10-CM | POA: Insufficient documentation

## 2016-07-21 DIAGNOSIS — Z79899 Other long term (current) drug therapy: Secondary | ICD-10-CM | POA: Insufficient documentation

## 2016-07-21 DIAGNOSIS — F319 Bipolar disorder, unspecified: Secondary | ICD-10-CM | POA: Insufficient documentation

## 2016-07-21 DIAGNOSIS — I10 Essential (primary) hypertension: Secondary | ICD-10-CM | POA: Insufficient documentation

## 2016-07-21 MED ORDER — PROMETHAZINE-CODEINE 6.25-10 MG/5ML PO SYRP: 5.0000 mL | ORAL_SOLUTION | ORAL | Status: DC | PRN

## 2016-07-21 NOTE — ED Notes (Signed)
Pt made aware to return if symptoms worsen or if any life threatening symptoms occur.   

## 2016-07-21 NOTE — Discharge Instructions (Signed)
Upper Respiratory Infection, Adult Most upper respiratory infections (URIs) are a viral infection of the air passages leading to the lungs. A URI affects the nose, throat, and upper air passages. The most common type of URI is nasopharyngitis and is typically referred to as "the common cold." URIs run their course and usually go away on their own. Most of the time, a URI does not require medical attention, but sometimes a bacterial infection in the upper airways can follow a viral infection. This is called a secondary infection. Sinus and middle ear infections are common types of secondary upper respiratory infections. Bacterial pneumonia can also complicate a URI. A URI can worsen asthma and chronic obstructive pulmonary disease (COPD). Sometimes, these complications can require emergency medical care and may be life threatening.  CAUSES Almost all URIs are caused by viruses. A virus is a type of germ and can spread from one person to another.  RISKS FACTORS You may be at risk for a URI if:   You smoke.   You have chronic heart or lung disease.  You have a weakened defense (immune) system.   You are very young or very old.   You have nasal allergies or asthma.  You work in crowded or poorly ventilated areas.  You work in health care facilities or schools. SIGNS AND SYMPTOMS  Symptoms typically develop 2-3 days after you come in contact with a cold virus. Most viral URIs last 7-10 days. However, viral URIs from the influenza virus (flu virus) can last 14-18 days and are typically more severe. Symptoms may include:   Runny or stuffy (congested) nose.   Sneezing.   Cough.   Sore throat.   Headache.   Fatigue.   Fever.   Loss of appetite.   Pain in your forehead, behind your eyes, and over your cheekbones (sinus pain).  Muscle aches.  DIAGNOSIS  Your health care provider may diagnose a URI by:  Physical exam.  Tests to check that your symptoms are not due to  another condition such as:  Strep throat.  Sinusitis.  Pneumonia.  Asthma. TREATMENT  A URI goes away on its own with time. It cannot be cured with medicines, but medicines may be prescribed or recommended to relieve symptoms. Medicines may help:  Reduce your fever.  Reduce your cough.  Relieve nasal congestion. HOME CARE INSTRUCTIONS   Take medicines only as directed by your health care provider.   Gargle warm saltwater or take cough drops to comfort your throat as directed by your health care provider.  Use a warm mist humidifier or inhale steam from a shower to increase air moisture. This may make it easier to breathe.  Drink enough fluid to keep your urine clear or pale yellow.   Eat soups and other clear broths and maintain good nutrition.   Rest as needed.   Return to work when your temperature has returned to normal or as your health care provider advises. You may need to stay home longer to avoid infecting others. You can also use a face mask and careful hand washing to prevent spread of the virus.  Increase the usage of your inhaler if you have asthma.   Do not use any tobacco products, including cigarettes, chewing tobacco, or electronic cigarettes. If you need help quitting, ask your health care provider. PREVENTION  The best way to protect yourself from getting a cold is to practice good hygiene.   Avoid oral or hand contact with people with cold  symptoms.   Wash your hands often if contact occurs.  There is no clear evidence that vitamin C, vitamin E, echinacea, or exercise reduces the chance of developing a cold. However, it is always recommended to get plenty of rest, exercise, and practice good nutrition.  SEEK MEDICAL CARE IF:   You are getting worse rather than better.   Your symptoms are not controlled by medicine.   You have chills.  You have worsening shortness of breath.  You have brown or red mucus.  You have yellow or brown nasal  discharge.  You have pain in your face, especially when you bend forward.  You have a fever.  You have swollen neck glands.  You have pain while swallowing.  You have white areas in the back of your throat. SEEK IMMEDIATE MEDICAL CARE IF:   You have severe or persistent:  Headache.  Ear pain.  Sinus pain.  Chest pain.  You have chronic lung disease and any of the following:  Wheezing.  Prolonged cough.  Coughing up blood.  A change in your usual mucus.  You have a stiff neck.  You have changes in your:  Vision.  Hearing.  Thinking.  Mood. MAKE SURE YOU:   Understand these instructions.  Will watch your condition.  Will get help right away if you are not doing well or get worse.   This information is not intended to replace advice given to you by your health care provider. Make sure you discuss any questions you have with your health care provider.   Document Released: 06/12/2001 Document Revised: 05/03/2015 Document Reviewed: 03/24/2014 Elsevier Interactive Patient Education 2016 [employer]   You may take the cough syrup prescribed for pain relief and cough relief.  This will make you drowsy - do not drive within 4 hours of taking this medication.

## 2016-07-21 NOTE — ED Provider Notes (Signed)
CSN: [id number]     Arrival date & time 07/21/16  1522 History   First MD Initiated Contact with Patient 07/21/16 1607     Chief Complaint  Patient presents with  . Nasal Congestion     (Consider location/radiation/quality/duration/timing/severity/associated sxs/prior Treatment) The history is provided by the patient.   Lori Huerta is a 30 y.o. female presenting with uri type symptoms over the past 4 days which includes nasal congestion with clear rhinorrhea, sore throat, low grade fever and nonproductive cough.  Symptoms do not include shortness of breath, chest pain,  Nausea, vomiting or diarrhea.  The patient has taken zytrec and an otc cold and congestion formula prior to arrival with no significant improvement in symptoms.      Past Medical History  Diagnosis Date  . Bipolar 1 disorder (HCC)   . Anxiety   . HYPERTENSION 12/07/2010    only with past pregnancy  . Kidney stones    Past Surgical History  Procedure Laterality Date  . Cesarean section    . Laparoscopy  02/04/2013    Procedure: LAPAROSCOPY OPERATIVE;  Surgeon: Allie Bossier, MD;  Location: [facility];  Service: Gynecology;  Laterality: N/A;  . Bilateral salpingectomy  02/04/2013    Procedure: BILATERAL SALPINGECTOMY;  Surgeon: Allie Bossier, MD;  Location: [facility];  Service: Gynecology;  Laterality: N/A;   Family History  Problem Relation Age of Onset  . Cancer Mother    Social History  Substance Use Topics  . Smoking status: Never Smoker   . Smokeless tobacco: Never Used  . Alcohol Use: Yes     Comment: occasionally   OB History    Gravida Para Term Preterm AB TAB SAB Ectopic Multiple Living   3 2 2  1  1   2      Review of Systems  Constitutional: Positive for chills.  HENT: Positive for congestion, rhinorrhea and sore throat. Negative for ear pain, sinus pressure, trouble swallowing and voice change.   Eyes: Negative for discharge.  Respiratory: Positive for cough. Negative for shortness of breath, wheezing  and stridor.   Cardiovascular: Negative for chest pain.  Gastrointestinal: Negative for abdominal pain.  Genitourinary: Negative.       Allergies  Review of patient's allergies indicates no known allergies.  Home Medications   Prior to Admission medications   Medication Sig Start Date End Date Taking? Authorizing Provider  amoxicillin (AMOXIL) 500 MG capsule Take 1 capsule (500 mg total) by mouth 3 (three) times daily. Patient not taking: Reported on 01/26/2016 12/14/15   Elson Areas, PA-C  azithromycin (ZITHROMAX) 250 MG tablet Take first 2 tablets together, then 1 every day until finished. 03/16/16   Tammy Triplett, PA-C  HYDROcodone-homatropine (HYCODAN) 5-1.5 MG/5ML syrup Take 5 mLs by mouth every 6 (six) hours as needed for cough. 03/16/16   Tammy Triplett, PA-C  magic mouthwash w/lidocaine SOLN Take 5 mLs by mouth 3 (three) times daily as needed for mouth pain. Swish and spit, do not swallow 01/26/16   Tammy Triplett, PA-C  promethazine-codeine (PHENERGAN WITH CODEINE) 6.25-10 MG/5ML syrup Take 5 mLs by mouth every 4 (four) hours as needed for cough. 07/21/16   Burgess Amor, PA-C   BP 114/80 mmHg  Pulse 92  Temp(Src) 98 F (36.7 C) (Oral)  Resp 16  Ht 5\' 6"  (1.676 m)  Wt 61.236 kg  BMI 21.80 kg/m2  SpO2 100%  LMP 06/30/2016 Physical Exam  Constitutional: She is oriented to person, place, and time.  She appears well-developed and well-nourished.  HENT:  Head: Normocephalic and atraumatic.  Right Ear: Tympanic membrane and ear canal normal.  Left Ear: Tympanic membrane and ear canal normal.  Nose: Mucosal edema and rhinorrhea present.  Mouth/Throat: Uvula is midline, oropharynx is clear and moist and mucous membranes are normal. No oropharyngeal exudate, posterior oropharyngeal edema, posterior oropharyngeal erythema or tonsillar abscesses.  Eyes: Conjunctivae are normal.  Cardiovascular: Normal rate and normal heart sounds.   Pulmonary/Chest: Effort normal. No respiratory  distress. She has decreased breath sounds. She has no wheezes. She has no rhonchi. She has no rales.  Abdominal: Soft. There is no tenderness.  Musculoskeletal: Normal range of motion.  Neurological: She is alert and oriented to person, place, and time.  Skin: Skin is warm and dry. No rash noted.  Psychiatric: She has a normal mood and affect.    ED Course  Procedures (including critical care time) Labs Review Labs Reviewed - No data to display  Imaging Review No results found. I have personally reviewed and evaluated these images and lab results as part of my medical decision-making.   EKG Interpretation None      MDM   Final diagnoses:  Acute URI    Pt with viral uri.  Advised rest, increased fluids, warm salt gargles. Phenergan with codeine for sleep and cough relief. Prn f/u for any worsened or persistent sx.     Burgess Amor, PA-C 07/21/16 1720  Margarita Grizzle, MD 07/21/16 [phone]

## 2016-07-21 NOTE — ED Notes (Signed)
Nasal congestion for last 4 days.  C/o cough and burning to throat.  Denies pain, discomfort to throat.

## 2016-10-31 ENCOUNTER — Encounter (HOSPITAL_COMMUNITY): Payer: Self-pay

## 2016-10-31 ENCOUNTER — Emergency Department (HOSPITAL_COMMUNITY)
Admission: EM | Admit: 2016-10-31 | Discharge: 2016-10-31 | Disposition: A | Discharge status: Home / Self Care | Payer: Self-pay | Attending: Emergency Medicine | Admitting: Emergency Medicine

## 2016-10-31 ENCOUNTER — Emergency Department (HOSPITAL_COMMUNITY): Payer: Self-pay

## 2016-10-31 DIAGNOSIS — Z79899 Other long term (current) drug therapy: Secondary | ICD-10-CM | POA: Insufficient documentation

## 2016-10-31 DIAGNOSIS — R0789 Other chest pain: Secondary | ICD-10-CM | POA: Insufficient documentation

## 2016-10-31 DIAGNOSIS — R079 Chest pain, unspecified: Secondary | ICD-10-CM

## 2016-10-31 DIAGNOSIS — I10 Essential (primary) hypertension: Secondary | ICD-10-CM | POA: Insufficient documentation

## 2016-10-31 LAB — CBC
HEMATOCRIT: 37.5 % (ref 36.0–46.0)
Hemoglobin: 13 g/dL (ref 12.0–15.0)
MCH: 31.7 pg (ref 26.0–34.0)
MCHC: 34.7 g/dL (ref 30.0–36.0)
MCV: 91.5 fL (ref 78.0–100.0)
PLATELETS: 279 10*3/uL (ref 150–400)
RBC: 4.1 MIL/uL (ref 3.87–5.11)
RDW: 12.4 % (ref 11.5–15.5)
WBC: 8 10*3/uL (ref 4.0–10.5)

## 2016-10-31 LAB — BASIC METABOLIC PANEL
Anion gap: 7 (ref 5–15)
BUN: 12 mg/dL (ref 6–20)
CALCIUM: 9.2 mg/dL (ref 8.9–10.3)
CO2: 24 mmol/L (ref 22–32)
CREATININE: 0.56 mg/dL (ref 0.44–1.00)
Chloride: 105 mmol/L (ref 101–111)
GFR calc Af Amer: >60 mL/min (ref >60)
GLUCOSE: 116 mg/dL — AB (ref 65–99)
Potassium: 3.5 mmol/L (ref 3.5–5.1)
Sodium: 136 mmol/L (ref 135–145)

## 2016-10-31 LAB — TROPONIN I

## 2016-10-31 NOTE — Discharge Instructions (Signed)
Screening tests were normal. Suspect this is pain in the muscles and ribs in your chest and back. Tylenol or ibuprofen for pain.

## 2016-10-31 NOTE — ED Notes (Signed)
Pt upset and stated we did not do anything for her. I explained the testing we did and that her chest pain was not cardiac at this time. Pt upset because she did not have a definite diagnosis and I informed her that she needed to find a pcp or go to the health dept.

## 2016-10-31 NOTE — ED Provider Notes (Signed)
[facility] Provider Note   CSN: [id number] Arrival date & time: 10/31/16  1607     History   Chief Complaint Chief Complaint  Patient presents with  . Chest Pain    HPI Lori Huerta is a 30 y.o. female.  Anterior central chest pain with radiation to the back earlier today worse with deep breath, palpation, movement. She is normally healthy. She takes her medications. No smoking. No history of coronary disease. Severity is mild. Nothing makes symptoms better or worse.      Past Medical History:  Diagnosis Date  . Anxiety   . Bipolar 1 disorder (HCC)   . HYPERTENSION 12/07/2010   only with past pregnancy  . Kidney stones     Patient Active Problem List   Diagnosis Date Noted  . Admission for sterilization 02/04/2013  . UTI (lower urinary tract infection) 01/06/2013  . Rash 01/06/2013  . Back pain 12/01/2012  . Contraception 12/01/2012  . Thyroid nodule 11/04/2012  . Abdominal pain 09/29/2012  . Bipolar II disorder (HCC) 01/15/2011  . 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 ANEMIA, IRON DEFICIENCY, UNSPEC. 02/27/2007  . ANXIETY 02/27/2007    Past Surgical History:  Procedure Laterality Date  . BILATERAL SALPINGECTOMY  02/04/2013   Procedure: BILATERAL SALPINGECTOMY;  Surgeon: Allie BossierMyra C Dove, MD;  Location: [facility];  Service: Gynecology;  Laterality: N/A;  . CESAREAN SECTION    . LAPAROSCOPY  02/04/2013   Procedure: LAPAROSCOPY OPERATIVE;  Surgeon: Allie BossierMyra C Dove, MD;  Location: [facility];  Service: Gynecology;  Laterality: N/A;    OB History    Gravida Para Term Preterm AB Living   3 2 2   1 2    SAB TAB Ectopic Multiple Live Births   1               Home Medications    Prior to Admission medications   Medication Sig Start Date End Date Taking? Authorizing Provider  amoxicillin (AMOXIL) 500 MG capsule Take 1 capsule (500 mg total) by mouth 3 (three) times daily. Patient not taking: Reported on 01/26/2016 12/14/15   Elson AreasLeslie K Sofia,  PA-C  azithromycin (ZITHROMAX) 250 MG tablet Take first 2 tablets together, then 1 every day until finished. 03/16/16   Tammy Triplett, PA-C  HYDROcodone-homatropine (HYCODAN) 5-1.5 MG/5ML syrup Take 5 mLs by mouth every 6 (six) hours as needed for cough. 03/16/16   Tammy Triplett, PA-C  magic mouthwash w/lidocaine SOLN Take 5 mLs by mouth 3 (three) times daily as needed for mouth pain. Swish and spit, do not swallow 01/26/16   Tammy Triplett, PA-C  promethazine-codeine (PHENERGAN WITH CODEINE) 6.25-10 MG/5ML syrup Take 5 mLs by mouth every 4 (four) hours as needed for cough. 07/21/16   Burgess AmorJulie Idol, PA-C    Family History Family History  Problem Relation Age of Onset  . Cancer Mother     Social History Social History  Substance Use Topics  . Smoking status: Never Smoker  . Smokeless tobacco: Never Used  . Alcohol use Yes     Comment: occasionally     Allergies   Review of patient's allergies indicates no known allergies.   Review of Systems Review of Systems  All other systems reviewed and are negative.    Physical Exam Updated Vital Signs BP 111/73   Pulse 99   Temp 98.1 F (36.7 C) (Oral)   Resp 16   Ht 5\' 6"  (1.676 m)   Wt 135 lb (61.2  kg)   LMP 10/09/2016   SpO2 97%   BMI 21.79 kg/m   Physical Exam  Constitutional: She is oriented to person, place, and time. She appears well-developed and well-nourished.  HENT:  Head: Normocephalic and atraumatic.  Eyes: Conjunctivae are normal.  Neck: Neck supple.  Cardiovascular: Normal rate and regular rhythm.   Pulmonary/Chest:  Tender to palpation anterior chest wall  Abdominal: Soft. Bowel sounds are normal.  Musculoskeletal: Normal range of motion.  Neurological: She is alert and oriented to person, place, and time.  Skin: Skin is warm and dry.  Psychiatric: She has a normal mood and affect. Her behavior is normal.  Nursing note and vitals reviewed.    ED Treatments / Results  Labs (all labs ordered are listed,  but only abnormal results are displayed) Labs Reviewed  BASIC METABOLIC PANEL - Abnormal; Notable for the following:       Result Value   Glucose, Bld 116 (*)    All other components within normal limits  CBC  TROPONIN I    EKG  EKG Interpretation None       Date: 10/31/2016  Rate: 87  Rhythm: normal sinus rhythm  QRS Axis: normal  Intervals: normal  ST/T Wave abnormalities: normal  Conduction Disutrbances: none  Narrative Interpretation: unremarkable    Radiology Dg Chest 2 View  Result Date: 10/31/2016 CLINICAL DATA:  Chest pain. EXAM: CHEST  2 VIEW COMPARISON:  11/24/2013. FINDINGS: Mediastinum and hilar structures normal. Lungs are clear. No pleural effusion or pneumothorax. No acute bony abnormality . IMPRESSION: No acute abnormality. Electronically Signed   By: Maisie Fushomas  Register   On: 10/31/2016 16:51    Procedures Procedures (including critical care time)  Medications Ordered in ED Medications - No data to display   Initial Impression / Assessment and Plan / ED Course  I have reviewed the triage vital signs and the nursing notes.  Pertinent labs & imaging results that were available during my care of the patient were reviewed by me and considered in my medical decision making (see chart for details).  Clinical Course    Patient is low risk for ACS or PE. Screening tests are normal.  Final Clinical Impressions(s) / ED Diagnoses   Final diagnoses:  Chest pain, unspecified type    New Prescriptions New Prescriptions   No medications on file     Donnetta HutchingBrian Terrisha Lopata, MD 10/31/16 1907

## 2016-10-31 NOTE — ED Triage Notes (Signed)
Reports of chest pain that started this morning while at work. Reports of doing light work. Denies cough or shortness of breath.

## 2017-05-14 ENCOUNTER — Emergency Department (HOSPITAL_COMMUNITY)
Admission: EM | Admit: 2017-05-14 | Discharge: 2017-05-14 | Disposition: A | Discharge status: Home / Self Care | Payer: 59 | Attending: Emergency Medicine | Admitting: Emergency Medicine

## 2017-05-14 ENCOUNTER — Encounter (HOSPITAL_COMMUNITY): Payer: Self-pay | Admitting: *Deleted

## 2017-05-14 DIAGNOSIS — R112 Nausea with vomiting, unspecified: Secondary | ICD-10-CM | POA: Insufficient documentation

## 2017-05-14 DIAGNOSIS — M545 Low back pain, unspecified: Secondary | ICD-10-CM

## 2017-05-14 DIAGNOSIS — I10 Essential (primary) hypertension: Secondary | ICD-10-CM | POA: Diagnosis not present

## 2017-05-14 DIAGNOSIS — R1031 Right lower quadrant pain: Secondary | ICD-10-CM | POA: Insufficient documentation

## 2017-05-14 LAB — POC URINE PREG, ED: PREG TEST UR: NEGATIVE

## 2017-05-14 LAB — URINALYSIS, MICROSCOPIC (REFLEX): WBC, UA: NONE SEEN WBC/hpf (ref 0–5)

## 2017-05-14 LAB — URINALYSIS, ROUTINE W REFLEX MICROSCOPIC
Bilirubin Urine: NEGATIVE
GLUCOSE, UA: 250 mg/dL — AB
KETONES UR: NEGATIVE mg/dL
Nitrite: POSITIVE — AB
PROTEIN: 30 mg/dL — AB
Specific Gravity, Urine: 1.01 (ref 1.005–1.030)
pH: 6.5 (ref 5.0–8.0)

## 2017-05-14 MED ORDER — SULFAMETHOXAZOLE-TRIMETHOPRIM 800-160 MG PO TABS: 1.0000 | ORAL_TABLET | Freq: Two times a day (BID) | ORAL | 0 refills | Status: AC

## 2017-05-14 MED ORDER — SULFAMETHOXAZOLE-TRIMETHOPRIM 800-160 MG PO TABS
1.0000 | ORAL_TABLET | Freq: Once | ORAL | Status: AC
  Administered 2017-05-14: 1 via ORAL
  Filled 2017-05-14: qty 1

## 2017-05-14 MED ORDER — ONDANSETRON 4 MG PO TBDP: 4.0000 mg | ORAL_TABLET | Freq: Three times a day (TID) | ORAL | 0 refills | Status: AC | PRN

## 2017-05-14 MED ORDER — ONDANSETRON 4 MG PO TBDP
4.0000 mg | ORAL_TABLET | Freq: Once | ORAL | Status: AC
Start: 2017-05-14 — End: 2017-05-14
  Administered 2017-05-14: 4 mg via ORAL
  Filled 2017-05-14: qty 1

## 2017-05-14 NOTE — Discharge Instructions (Signed)

## 2017-05-14 NOTE — ED Triage Notes (Addendum)
Pt is here for flank pain, mostly in her right flank but also somewhat in her generalized lower back.  Pt states that she has pain right before voiding. No fever with this. Pt states that she has hx of a kidney stone.  Pt is calm in triage

## 2017-05-14 NOTE — ED Provider Notes (Signed)
[facility] Provider Note   CSN: [id number] Arrival date & time: 05/14/17  1302     History   Chief Complaint Chief Complaint  Patient presents with  . Flank Pain    HPI Lori Huerta is a 31 y.o. female.  HPI  The pt is a 31 y/o female Has had a weeks of lower back pain especially on the R lower back.  She has had constant pain in that area with some radiation to the RLQ when she urinates - it gets better after she urinates - she has been nauseated today and vomiting X 1 and had used AZO without relief.  She has no fevers.  She has had frequent UTI's in the past and has only one episode of kidney stones in the past.  No burning, frequency or dysuria or urgency.  Urine has chagned colors to dark  Orange after starting AZO.    Past Medical History:  Diagnosis Date  . Anxiety   . Bipolar 1 disorder (HCC)   . HYPERTENSION 12/07/2010   only with past pregnancy  . Kidney stones     Patient Active Problem List   Diagnosis Date Noted  . Admission for sterilization 02/04/2013  . UTI (lower urinary tract infection) 01/06/2013  . Rash 01/06/2013  . Back pain 12/01/2012  . Contraception 12/01/2012  . Thyroid nodule 11/04/2012  . Abdominal pain 09/29/2012  . Bipolar II disorder (HCC) 01/15/2011  . 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 ANEMIA, IRON DEFICIENCY, UNSPEC. 02/27/2007  . ANXIETY 02/27/2007    Past Surgical History:  Procedure Laterality Date  . BILATERAL SALPINGECTOMY  02/04/2013   Procedure: BILATERAL SALPINGECTOMY;  Surgeon: Allie Bossier, MD;  Location: [facility];  Service: Gynecology;  Laterality: N/A;  . CESAREAN SECTION    . LAPAROSCOPY  02/04/2013   Procedure: LAPAROSCOPY OPERATIVE;  Surgeon: Allie Bossier, MD;  Location: [facility];  Service: Gynecology;  Laterality: N/A;    OB History    Gravida Para Term Preterm AB Living   3 2 2   1 2    SAB TAB Ectopic Multiple Live Births   1               Home Medications    Prior to  Admission medications   Medication Sig Start Date End Date Taking? Authorizing Provider  ondansetron (ZOFRAN ODT) 4 MG disintegrating tablet Take 1 tablet (4 mg total) by mouth every 8 (eight) hours as needed for nausea. 05/14/17   Eber Hong, MD  sulfamethoxazole-trimethoprim (BACTRIM DS,SEPTRA DS) 800-160 MG tablet Take 1 tablet by mouth 2 (two) times daily. 05/14/17 05/21/17  Eber Hong, MD    Family History Family History  Problem Relation Age of Onset  . Cancer Mother     Social History Social History  Substance Use Topics  . Smoking status: Never Smoker  . Smokeless tobacco: Never Used  . Alcohol use Yes     Comment: occasionally     Allergies   Patient has no known allergies.   Review of Systems Review of Systems  Constitutional: Negative for chills and fever.  HENT: Negative for sore throat.   Eyes: Negative for visual disturbance.  Respiratory: Negative for cough and shortness of breath.   Cardiovascular: Negative for chest pain.  Gastrointestinal: Positive for nausea and vomiting. Negative for abdominal pain and diarrhea.  Genitourinary: Positive for flank pain. Negative for dysuria and frequency.  Musculoskeletal: Positive for back pain. Negative for  neck pain.  Skin: Negative for rash.  Neurological: Negative for weakness, numbness and headaches.  Hematological: Negative for adenopathy.  Psychiatric/Behavioral: Negative for behavioral problems.     Physical Exam Updated Vital Signs BP 123/74 (BP Location: Right Arm)   Pulse 82   Temp 98.4 F (36.9 C) (Oral)   Resp 16   LMP 04/30/2017 (Approximate)   SpO2 100%   Physical Exam  Constitutional: She appears well-developed and well-nourished. No distress.  HENT:  Head: Normocephalic and atraumatic.  Mouth/Throat: Oropharynx is clear and moist. No oropharyngeal exudate.  Eyes: Conjunctivae and EOM are normal. Pupils are equal, round, and reactive to light. Right eye exhibits no discharge. Left eye  exhibits no discharge. No scleral icterus.  Neck: Normal range of motion. Neck supple. No JVD present. No thyromegaly present.  Cardiovascular: Normal rate, regular rhythm, normal heart sounds and intact distal pulses.  Exam reveals no gallop and no friction rub.   No murmur heard. Pulmonary/Chest: Effort normal and breath sounds normal. No respiratory distress. She has no wheezes. She has no rales.  Abdominal: Soft. Bowel sounds are normal. She exhibits no distension and no mass. There is no tenderness.  Musculoskeletal: Normal range of motion. She exhibits no edema or tenderness.  No CVA ttp or back pain  Lymphadenopathy:    She has no cervical adenopathy.  Neurological: She is alert. Coordination normal.  Skin: Skin is warm and dry. No rash noted. No erythema.  Psychiatric: She has a normal mood and affect. Her behavior is normal.  Nursing note and vitals reviewed.    ED Treatments / Results  Labs (all labs ordered are listed, but only abnormal results are displayed) Labs Reviewed  URINALYSIS, ROUTINE W REFLEX MICROSCOPIC - Abnormal; Notable for the following:       Result Value   Color, Urine ORANGE (*)    APPearance HAZY (*)    Glucose, UA 250 (*)    Hgb urine dipstick TRACE (*)    Protein, ur 30 (*)    Nitrite POSITIVE (*)    Leukocytes, UA TRACE (*)    All other components within normal limits  URINALYSIS, MICROSCOPIC (REFLEX) - Abnormal; Notable for the following:    Bacteria, UA MANY (*)    Squamous Epithelial / LPF 0-5 (*)    All other components within normal limits  URINE CULTURE  POC URINE PREG, ED    EKG  EKG Interpretation None       Radiology No results found.  Procedures Procedures (including critical care time)  Medications Ordered in ED Medications  sulfamethoxazole-trimethoprim (BACTRIM DS,SEPTRA DS) 800-160 MG per tablet 1 tablet (not administered)  ondansetron (ZOFRAN-ODT) disintegrating tablet 4 mg (not administered)     Initial  Impression / Assessment and Plan / ED Course  I have reviewed the triage vital signs and the nursing notes.  Pertinent labs & imaging results that were available during my care of the patient were reviewed by me and considered in my medical decision making (see chart for details).     The patient is extremely well-appearing without any signs of colicky pain, she does not appear to be uncomfortable, she has no reproducible tenderness to palpation over the abdomen or the flank and my bedside ultrasound reveals no signs of hydronephrosis. The urinalysis gives a mixed picture with many bacteria but no white blood cells seen on microscopic. She is nitrite positive with a history of frequent urinary tract infections. She denies vaginal discharge or STD risk. She  will be discharged home with Bactrim to follow-up with urinary culture.  Final Clinical Impressions(s) / ED Diagnoses   Final diagnoses:  Right-sided low back pain without sciatica, unspecified chronicity    New Prescriptions New Prescriptions   ONDANSETRON (ZOFRAN ODT) 4 MG DISINTEGRATING TABLET    Take 1 tablet (4 mg total) by mouth every 8 (eight) hours as needed for nausea.   SULFAMETHOXAZOLE-TRIMETHOPRIM (BACTRIM DS,SEPTRA DS) 800-160 MG TABLET    Take 1 tablet by mouth 2 (two) times daily.     Eber Hong, MD 05/14/17 [phone]

## 2017-05-17 ENCOUNTER — Telehealth: Payer: Self-pay | Admitting: *Deleted

## 2017-05-17 LAB — URINE CULTURE

## 2017-05-17 NOTE — Progress Notes (Signed)
ED Antimicrobial Stewardship Positive Culture Follow Up   Lori HesselbachCarla L Huerta is an 31 y.o. female [facility] with a chief complaint of  Chief Complaint  Patient presents with  . Flank Pain    Recent Results (from the past 720 hour(s))  Urine culture     Status: Abnormal   Collection Time: 05/14/17  1:53 PM  Result Value Ref Range Status   Specimen Description URINE, CLEAN CATCH  Final   Special Requests NONE  Final   Culture >=100,000 COLONIES/mL ESCHERICHIA COLI (A)  Final   Report Status 05/16/2017 FINAL  Final   Organism ID, Bacteria ESCHERICHIA COLI (A)  Final      Susceptibility   Escherichia coli - MIC*    AMPICILLIN >=32 RESISTANT Resistant     CEFAZOLIN <=4 SENSITIVE Sensitive     CEFTRIAXONE <=1 SENSITIVE Sensitive     CIPROFLOXACIN <=0.25 SENSITIVE Sensitive     GENTAMICIN <=1 SENSITIVE Sensitive     IMIPENEM <=0.25 SENSITIVE Sensitive     NITROFURANTOIN <=16 SENSITIVE Sensitive     TRIMETH/SULFA >=320 RESISTANT Resistant     AMPICILLIN/SULBACTAM 16 INTERMEDIATE Intermediate     PIP/TAZO <=4 SENSITIVE Sensitive     Extended ESBL NEGATIVE Sensitive     * >=100,000 COLONIES/mL ESCHERICHIA COLI   [x]  Treated with Bactrim DS PO BID, organism resistant to prescribed antimicrobial. The patient presented with N/V, right flank pain with generalized back pain, and pain before voiding.   Plan: Call patient to discontinue Bactrim DS. If patient is still having urinary symptoms (dysuria, frequency, urgency, flank pain), initiate Keflex 500 mg PO BID x 7 day.   ED Provider: Rhea BleacherJosh Geiple, PA-C  Kayleen MemosSonya L Lima Chillemi 05/17/2017, 9:13 AM Student Pharmacist

## 2017-05-17 NOTE — Telephone Encounter (Signed)
Post ED Visit - Positive Culture Follow-up  Culture report reviewed by antimicrobial stewardship pharmacist:  []  Lori Huerta, Pharm.D. []  Celedonio MiyamotoJeremy Huerta, Pharm.D., BCPS AQ-ID []  Garvin FilaMike Huerta, Pharm.D., BCPS []  Georgina PillionElizabeth Huerta, Pharm.D., BCPS []  DeckerMinh Huerta, [address] BoulevardPharm.D., BCPS, AAHIVP []  Estella HuskMichelle Huerta, Pharm.D., BCPS, AAHIVP []  Lysle Pearlachel Rumbarger, PharmD, BCPS []  Casilda Carlsaylor Stone, PharmD, BCPS []  Pollyann SamplesAndy Huerta, PharmD, BCPS  Lori Geiple, PA-C  Positive urine culture Treated with Sulfamethoxazole-trimethoprim.  Contacted patient who states symptoms are resolved and no further patient follow-up is required at this time.  Virl AxeRobertson, [facility] 05/17/2017, 9:32 AM

## 2017-09-12 ENCOUNTER — Emergency Department (HOSPITAL_COMMUNITY): Payer: 59

## 2017-09-12 ENCOUNTER — Encounter (HOSPITAL_COMMUNITY): Payer: Self-pay | Admitting: *Deleted

## 2017-09-12 ENCOUNTER — Emergency Department (HOSPITAL_COMMUNITY)
Admission: EM | Admit: 2017-09-12 | Discharge: 2017-09-12 | Disposition: A | Discharge status: Home / Self Care | Payer: 59 | Attending: Emergency Medicine | Admitting: Emergency Medicine

## 2017-09-12 DIAGNOSIS — I10 Essential (primary) hypertension: Secondary | ICD-10-CM | POA: Insufficient documentation

## 2017-09-12 DIAGNOSIS — M545 Low back pain: Secondary | ICD-10-CM | POA: Diagnosis present

## 2017-09-12 DIAGNOSIS — M546 Pain in thoracic spine: Secondary | ICD-10-CM | POA: Diagnosis not present

## 2017-09-12 MED ORDER — NAPROXEN 500 MG PO TABS: 500.0000 mg | ORAL_TABLET | Freq: Two times a day (BID) | ORAL | 0 refills | Status: DC

## 2017-09-12 MED ORDER — CYCLOBENZAPRINE HCL 10 MG PO TABS: 10.0000 mg | ORAL_TABLET | Freq: Three times a day (TID) | ORAL | 0 refills | Status: DC | PRN

## 2017-09-12 NOTE — Discharge Instructions (Signed)
Naproxen as prescribed.  Flexeril as prescribed as needed for pain not relieved with naproxen.  Follow-up with your primary Dr. If symptoms are not improving in the next week, and return to the ER if symptoms significantly worsen or change in the meantime.

## 2017-09-12 NOTE — ED Triage Notes (Signed)
Pt c/o upper back pain to left thoracic area; pt describes it as a knot and states the pain woke her up tonight and has gotten progressively worse over the last 2 weeks; pt denies any injury

## 2017-09-12 NOTE — ED Provider Notes (Signed)
[facility] DEPT Provider Note   CSN: [id number] Arrival date & time: 09/12/17  0434     History   Chief Complaint Chief Complaint  Patient presents with  . Back Pain    HPI Lori Huerta is a 31 y.o. female.  Patient is a 31 year old female with history of bipolar disorder presenting with complaints of pain in her left upper back. This is been ongoing for the past 2 weeks. It occurs episodically. She denies any specific injury or trauma. She denies any difficulty breathing, chest pain, cough, or fever. It is worse when she moves in certain directions.   The history is provided by the patient.  Back Pain   This is a new problem. Episode onset: 2 weeks ago. Episode frequency: intermittently. The problem has been gradually worsening. The pain is associated with no known injury. The pain is present in the thoracic spine. The quality of the pain is described as stabbing. The pain does not radiate. Pertinent negatives include no fever, no bowel incontinence and no bladder incontinence. She has tried NSAIDs for the symptoms. The treatment provided no relief.    Past Medical History:  Diagnosis Date  . Anxiety   . Bipolar 1 disorder (HCC)   . HYPERTENSION 12/07/2010   only with past pregnancy  . Kidney stones     Patient Active Problem List   Diagnosis Date Noted  . Admission for sterilization 02/04/2013  . UTI (lower urinary tract infection) 01/06/2013  . Rash 01/06/2013  . Back pain 12/01/2012  . Contraception 12/01/2012  . Thyroid nodule 11/04/2012  . Abdominal pain 09/29/2012  . Bipolar II disorder (HCC) 01/15/2011  . 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 ANEMIA, IRON DEFICIENCY, UNSPEC. 02/27/2007  . ANXIETY 02/27/2007    Past Surgical History:  Procedure Laterality Date  . BILATERAL SALPINGECTOMY  02/04/2013   Procedure: BILATERAL SALPINGECTOMY;  Surgeon: Allie Bossier, MD;  Location: [facility];  Service: Gynecology;  Laterality: N/A;  .  CESAREAN SECTION    . LAPAROSCOPY  02/04/2013   Procedure: LAPAROSCOPY OPERATIVE;  Surgeon: Allie Bossier, MD;  Location: [facility];  Service: Gynecology;  Laterality: N/A;    OB History    Gravida Para Term Preterm AB Living   SAB TAB Ectopic Multiple Live Births   1               Home Medications    Prior to Admission medications   Medication Sig Start Date End Date Taking? Authorizing Provider  ondansetron (ZOFRAN ODT) 4 MG disintegrating tablet Take 1 tablet (4 mg total) by mouth every 8 (eight) hours as needed for nausea. 05/14/17   Eber Hong, MD    Family History Family History  Problem Relation Age of Onset  . Cancer Mother     Social History Social History  Substance Use Topics  . Smoking status: Never Smoker  . Smokeless tobacco: Never Used  . Alcohol use Yes     Comment: occasionally     Allergies   Patient has no known allergies.   Review of Systems Review of Systems  Constitutional: Negative for fever.  Gastrointestinal: Negative for bowel incontinence.  Genitourinary: Negative for bladder incontinence.  Musculoskeletal: Positive for back pain.  All other systems reviewed and are negative.    Physical Exam Updated Vital Signs BP 114/73 (BP Location: Right Arm)   Pulse 90   Temp 98.6 F (37  C) (Oral)   Resp 18   Ht 5\' 6"  (1.676 m)   Wt 63.5 kg (140 lb)   LMP 08/29/2017   SpO2 100%   BMI 22.60 kg/m   Physical Exam  Constitutional: She is oriented to person, place, and time. She appears well-developed and well-nourished. No distress.  HENT:  Head: Normocephalic and atraumatic.  Neck: Normal range of motion. Neck supple.  Cardiovascular: Normal rate and regular rhythm.  Exam reveals no gallop and no friction rub.   No murmur heard. Pulmonary/Chest: Effort normal and breath sounds normal. No respiratory distress. She has no wheezes. She exhibits no tenderness.  The patient reports the discomfort is in the left mid thoracic area  posteriorly. It is not reproducible with palpation.  Abdominal: Soft. Bowel sounds are normal. She exhibits no distension. There is no tenderness.  Musculoskeletal: Normal range of motion.  Neurological: She is alert and oriented to person, place, and time.  Skin: Skin is warm and dry. She is not diaphoretic.  Nursing note and vitals reviewed.    ED Treatments / Results  Labs (all labs ordered are listed, but only abnormal results are displayed) Labs Reviewed - No data to display  EKG  EKG Interpretation None       Radiology No results found.  Procedures Procedures (including critical care time)  Medications Ordered in ED Medications - No data to display   Initial Impression / Assessment and Plan / ED Course  I have reviewed the triage vital signs and the nursing notes.  Pertinent labs & imaging results that were available during my care of the patient were reviewed by me and considered in my medical decision making (see chart for details).  Patient with thoracic back pain that appears musculoskeletal in nature. Her x-rays reveal no cardiopulmonary abnormality or T-spine abnormality. I see no indication for further workup at this time. She will be started on an anti-inflammatory, a muscle relaxer, and is to follow-up with primary Dr. If not improving.  Final Clinical Impressions(s) / ED Diagnoses   Final diagnoses:  None    New Prescriptions New Prescriptions   No medications on file     Geoffery Lyonselo, Jalaysha Skilton, MD 09/12/17 [phone]

## 2017-10-02 ENCOUNTER — Encounter (HOSPITAL_COMMUNITY): Payer: Self-pay

## 2017-10-02 ENCOUNTER — Emergency Department (HOSPITAL_COMMUNITY)
Admission: EM | Admit: 2017-10-02 | Discharge: 2017-10-02 | Discharge status: Against Medical Advice | Payer: 59 | Attending: Emergency Medicine | Admitting: Emergency Medicine

## 2017-10-02 DIAGNOSIS — H9209 Otalgia, unspecified ear: Secondary | ICD-10-CM | POA: Insufficient documentation

## 2017-10-02 DIAGNOSIS — Z5321 Procedure and treatment not carried out due to patient leaving prior to being seen by health care provider: Secondary | ICD-10-CM | POA: Insufficient documentation

## 2017-10-02 DIAGNOSIS — H9202 Otalgia, left ear: Secondary | ICD-10-CM

## 2017-10-02 NOTE — ED Provider Notes (Cosign Needed)
[facility] Provider Note   CSN: [id number] Arrival date & time: 10/02/17  0804     History   Chief Complaint Chief Complaint  Patient presents with  . Otalgia    HPI Lori Huerta is a 31 y.o. female.  HPI   31 year old female with history of bipolar, anxiety presenting complaining of left ear pain. Patient states last night while she was resting she developed acute onset of sharp pain in her left ear that radiates to her left jaw and down her left chest and down to her left arm. Pain has been waxing and waning but intense. Pain is moderate in severity. Pain is not associated with lightheadedness, dizziness, diaphoresis, nausea or exertional chest pain. No running nose sneezing coughing or sore throat hearing changes or drainage coming from her ear. She has tried ibuprofen, heat, eyes, sinus medication with minimal relief. She denies any strong family history of cardiac disease, no history of premature cardiac death. She is an occasional drinker, nonsmoker. She denies any dental pain.  Past Medical History:  Diagnosis Date  . Anxiety   . Bipolar 1 disorder (HCC)   . HYPERTENSION 12/07/2010   only with past pregnancy  . Kidney stones     Patient Active Problem List   Diagnosis Date Noted  . Admission for sterilization 02/04/2013  . UTI (lower urinary tract infection) 01/06/2013  . Rash 01/06/2013  . Back pain 12/01/2012  . Contraception 12/01/2012  . Thyroid nodule 11/04/2012  . Abdominal pain 09/29/2012  . Bipolar II disorder (HCC) 01/15/2011  . 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 ANEMIA, IRON DEFICIENCY, UNSPEC. 02/27/2007  . ANXIETY 02/27/2007    Past Surgical History:  Procedure Laterality Date  . BILATERAL SALPINGECTOMY  02/04/2013   Procedure: BILATERAL SALPINGECTOMY;  Surgeon: Allie Bossier, MD;  Location: [facility];  Service: Gynecology;  Laterality: N/A;  . CESAREAN SECTION    . LAPAROSCOPY  02/04/2013   Procedure: LAPAROSCOPY  OPERATIVE;  Surgeon: Allie Bossier, MD;  Location: [facility];  Service: Gynecology;  Laterality: N/A;    OB History    Gravida Para Term Preterm AB Living   SAB TAB Ectopic Multiple Live Births   1               Home Medications    Prior to Admission medications   Medication Sig Start Date End Date Taking? Authorizing Provider  cyclobenzaprine (FLEXERIL) 10 MG tablet Take 1 tablet (10 mg total) by mouth 3 (three) times daily as needed for muscle spasms. 09/12/17   Geoffery Lyons, MD  naproxen (NAPROSYN) 500 MG tablet Take 1 tablet (500 mg total) by mouth 2 (two) times daily with a meal. 09/12/17   Geoffery Lyons, MD  ondansetron (ZOFRAN ODT) 4 MG disintegrating tablet Take 1 tablet (4 mg total) by mouth every 8 (eight) hours as needed for nausea. 05/14/17   Eber Hong, MD    Family History Family History  Problem Relation Age of Onset  . Cancer Mother     Social History Social History  Substance Use Topics  . Smoking status: Never Smoker  . Smokeless tobacco: Never Used  . Alcohol use Yes     Comment: occasionally     Allergies   Patient has no known allergies.   Review of Systems Review of Systems  All other systems reviewed and are negative.    Physical Exam Updated Vital Signs  BP 122/84 (BP Location: Right Arm)   Pulse 88   Temp 98.3 F (36.8 C) (Oral)   Resp 16   Ht  (1.676 m)   Wt 63.5 kg (140 lb)   LMP 09/14/2017   SpO2 100%   BMI 22.60 kg/m   Physical Exam  Constitutional: She is oriented to person, place, and time. She appears well-developed and well-nourished. No distress.  HENT:  Head: Atraumatic.  Nose: Nose normal.  Mouth/Throat: No oropharyngeal exudate.  Ears: Normal TMs bilaterally, no pain with manipulation of the ear lobes Nose: Normal nares Throat: Uvula is midline no tonsillar enlargement or exudates, no dental pain.  Eyes: Conjunctivae are normal.  Neck: Normal range of motion. Neck supple.  Tenderness to left  side of neck on palpation without any palpable cords, bruits, or erythema or warmth  Cardiovascular: Normal rate and regular rhythm.   Pulmonary/Chest: Effort normal and breath sounds normal.  Abdominal: Soft. Bowel sounds are normal. She exhibits no distension. There is no tenderness.  Lymphadenopathy:    She has no cervical adenopathy.  Neurological: She is alert and oriented to person, place, and time.  Skin: No rash noted.  Psychiatric: She has a normal mood and affect.  Nursing note and vitals reviewed.    ED Treatments / Results  Labs (all labs ordered are listed, but only abnormal results are displayed) Labs Reviewed - No data to display  EKG  EKG Interpretation None       Radiology No results found.  Procedures Procedures (including critical care time)  Medications Ordered in ED Medications - No data to display   Initial Impression / Assessment and Plan / ED Course  I have reviewed the triage vital signs and the nursing notes.  Pertinent labs & imaging results that were available during my care of the patient were reviewed by me and considered in my medical decision making (see chart for details).     BP 122/84 (BP Location: Right Arm)   Pulse 88   Temp 98.3 F (36.8 C) (Oral)   Resp 16   Ht  (1.676 m)   Wt 63.5 kg (140 lb)   LMP 09/14/2017   SpO2 100%   BMI 22.60 kg/m    Final Clinical Impressions(s) / ED Diagnoses   Final diagnoses:  Left ear pain    New Prescriptions New Prescriptions   No medications on file   9:05 AM Patient here with complaints of left ear pain that radiates to her left side of jaw and neck chest and abdomen arm. She does not have any significant risk factor for ACS. Ear exam is without evidence of infection, no carotid bruit, no overlying skin changes to her neck. I have low suspicion for vertebral artery dissection or other vascular etiology I encouraged patient to continue with anti-inflammatory  medication  9:20 AM Pt have eloped.   Fayrene Helper, PA-C 10/02/17 [phone]

## 2017-10-02 NOTE — ED Triage Notes (Signed)
Pt reports sharp left ear, jaw, pain that radiates into left chest. Pain in left molars  Pain began last night. Pt states she felt stuffy and took alza seltzer cold and sinus. Pain is continuous but is intermittently very sharp

## 2017-10-02 NOTE — ED Notes (Signed)
Pt not in room. Seen with her pocket book leaving

## 2018-01-12 ENCOUNTER — Emergency Department (HOSPITAL_COMMUNITY)
Admission: EM | Admit: 2018-01-12 | Discharge: 2018-01-12 | Disposition: A | Discharge status: Home / Self Care | Payer: Medicaid Other | Attending: Emergency Medicine | Admitting: Emergency Medicine

## 2018-01-12 ENCOUNTER — Other Ambulatory Visit: Payer: Self-pay

## 2018-01-12 ENCOUNTER — Encounter (HOSPITAL_COMMUNITY): Payer: Self-pay | Admitting: Emergency Medicine

## 2018-01-12 DIAGNOSIS — Z79899 Other long term (current) drug therapy: Secondary | ICD-10-CM | POA: Diagnosis not present

## 2018-01-12 DIAGNOSIS — I10 Essential (primary) hypertension: Secondary | ICD-10-CM | POA: Diagnosis not present

## 2018-01-12 DIAGNOSIS — M7918 Myalgia, other site: Secondary | ICD-10-CM | POA: Diagnosis present

## 2018-01-12 DIAGNOSIS — J111 Influenza due to unidentified influenza virus with other respiratory manifestations: Secondary | ICD-10-CM | POA: Diagnosis not present

## 2018-01-12 LAB — RAPID STREP SCREEN (MED CTR MEBANE ONLY): Streptococcus, Group A Screen (Direct): NEGATIVE

## 2018-01-12 MED ORDER — OSELTAMIVIR PHOSPHATE 75 MG PO CAPS
75.0000 mg | ORAL_CAPSULE | Freq: Once | ORAL | Status: AC
  Administered 2018-01-12: 75 mg via ORAL
  Filled 2018-01-12: qty 1

## 2018-01-12 MED ORDER — BENZONATATE 100 MG PO CAPS: 100.0000 mg | ORAL_CAPSULE | Freq: Three times a day (TID) | ORAL | 0 refills | Status: DC

## 2018-01-12 MED ORDER — KETOROLAC TROMETHAMINE 30 MG/ML IJ SOLN
30.0000 mg | Freq: Once | INTRAMUSCULAR | Status: AC
  Administered 2018-01-12: 30 mg via INTRAVENOUS
  Filled 2018-01-12: qty 1

## 2018-01-12 MED ORDER — OSELTAMIVIR PHOSPHATE 75 MG PO CAPS: 75.0000 mg | ORAL_CAPSULE | Freq: Two times a day (BID) | ORAL | 0 refills | Status: AC

## 2018-01-12 MED ORDER — ONDANSETRON HCL 4 MG PO TABS: 4.0000 mg | ORAL_TABLET | Freq: Three times a day (TID) | ORAL | 0 refills | Status: AC | PRN

## 2018-01-12 MED ORDER — SODIUM CHLORIDE 0.9 % IV BOLUS (SEPSIS)
1000.0000 mL | Freq: Once | INTRAVENOUS | Status: AC
  Administered 2018-01-12: 1000 mL via INTRAVENOUS

## 2018-01-12 MED ORDER — ONDANSETRON HCL 4 MG/2ML IJ SOLN
4.0000 mg | Freq: Once | INTRAMUSCULAR | Status: AC
  Administered 2018-01-12: 4 mg via INTRAVENOUS
  Filled 2018-01-12: qty 2

## 2018-01-12 NOTE — ED Triage Notes (Signed)
Patient c/o flu like symptoms that started last nighty-body aches, sore throat, dry cough, and chills. Unsure of any fevers. Per patient taking Nightquil with no relief.

## 2018-01-12 NOTE — ED Provider Notes (Signed)
[facility] EMERGENCY DEPARTMENT Provider Note   CSN: [id number] Arrival date & time: 01/12/18  0844     History   Chief Complaint Chief Complaint  Patient presents with  . Generalized Body Aches    HPI Lori Huerta is a 32 y.o. female who presents to the emergency department today for flulike symptoms.  Patient states that her father recently had the flu earlier in the week and yesterday morning she started having similar symptoms including subjective fever, chills, body aches, sore throat, a dry-nonproductive cough. The patient took Nyquil for her symptoms last night but states that all this did was put her to sleep. She has had decreased oral intake since onset of her symptoms, with only a few small sips of water throughout the day yesterday and this morning. The patient denies any recent travel, chest pain, sob, doe, hemoptysis, HA, neck stiffness, sinus pressure, abdominal pain, N/V/D, constipation, or urinary symptoms. She did not get the flu shot this year.   HPI  Past Medical History:  Diagnosis Date  . Anxiety   . Bipolar 1 disorder (HCC)   . HYPERTENSION 12/07/2010   only with past pregnancy  . Kidney stones     Patient Active Problem List   Diagnosis Date Noted  . Admission for sterilization 02/04/2013  . UTI (lower urinary tract infection) 01/06/2013  . Rash 01/06/2013  . Back pain 12/01/2012  . Contraception 12/01/2012  . Thyroid nodule 11/04/2012  . Abdominal pain 09/29/2012  . Bipolar II disorder (HCC) 01/15/2011  . 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 ANEMIA, IRON DEFICIENCY, UNSPEC. 02/27/2007  . ANXIETY 02/27/2007    Past Surgical History:  Procedure Laterality Date  . BILATERAL SALPINGECTOMY  02/04/2013   Procedure: BILATERAL SALPINGECTOMY;  Surgeon: Allie Bossier, MD;  Location: [facility];  Service: Gynecology;  Laterality: N/A;  . CESAREAN SECTION    . LAPAROSCOPY  02/04/2013   Procedure: LAPAROSCOPY OPERATIVE;  Surgeon: Allie Bossier, MD;  Location: [facility];  Service: Gynecology;  Laterality: N/A;    OB History    Gravida Para Term Preterm AB Living   3 2 2   1 2    SAB TAB Ectopic Multiple Live Births   1               Home Medications    Prior to Admission medications   Medication Sig Start Date End Date Taking? Authorizing Provider  cyclobenzaprine (FLEXERIL) 10 MG tablet Take 1 tablet (10 mg total) by mouth 3 (three) times daily as needed for muscle spasms. 09/12/17   Geoffery Lyons, MD  naproxen (NAPROSYN) 500 MG tablet Take 1 tablet (500 mg total) by mouth 2 (two) times daily with a meal. 09/12/17   Geoffery Lyons, MD  ondansetron (ZOFRAN ODT) 4 MG disintegrating tablet Take 1 tablet (4 mg total) by mouth every 8 (eight) hours as needed for nausea. 05/14/17   Eber Hong, MD    Family History Family History  Problem Relation Age of Onset  . Cancer Mother     Social History Social History   Tobacco Use  . Smoking status: Never Smoker  . Smokeless tobacco: Never Used  Substance Use Topics  . Alcohol use: Yes    Comment: occasionally  . Drug use: No     Allergies   Patient has no known allergies.   Review of Systems Review of Systems  All other systems reviewed and are negative.    Physical  Exam Updated Vital Signs BP 114/79   Pulse 99   Temp 98.9 F (37.2 C) (Oral)   Resp 18   Ht 5\' 6"  (1.676 m)   Wt 68 kg (150 lb)   LMP 12/24/2017   SpO2 100%   BMI 24.21 kg/m   Physical Exam  Constitutional: She appears well-developed and well-nourished.  HENT:  Head: Normocephalic and atraumatic.  Right Ear: Tympanic membrane, external ear and ear canal normal.  Left Ear: Tympanic membrane, external ear and ear canal normal.  Nose: Mucosal edema present. No rhinorrhea. Right sinus exhibits no maxillary sinus tenderness and no frontal sinus tenderness. Left sinus exhibits no maxillary sinus tenderness and no frontal sinus tenderness.  Mouth/Throat: Uvula is midline, oropharynx is clear  and moist and mucous membranes are normal. No tonsillar exudate.  The patient has normal phonation and is in control of secretions. No stridor.  Midline uvula without edema. Soft palate rises symmetrically.  No tonsillar erythema or exudates. No PTA. Tongue protrusion is normal. No trismus. No creptius on neck palpation and patient has good dentition. No gingival erythema or fluctuance noted. Mucus membranes moist.   Eyes: Pupils are equal, round, and reactive to light. Right eye exhibits no discharge. Left eye exhibits no discharge. No scleral icterus.  Neck: Trachea normal and normal range of motion. Neck supple. No JVD present. No spinous process tenderness present. Carotid bruit is not present. No neck rigidity. Normal range of motion present.  No nuchal rigidity or meningismus  Cardiovascular: Normal rate, regular rhythm and intact distal pulses.  No murmur heard. Pulses:      Radial pulses are 2+ on the right side, and 2+ on the left side.       Dorsalis pedis pulses are 2+ on the right side, and 2+ on the left side.       Posterior tibial pulses are 2+ on the right side, and 2+ on the left side.  No lower extremity swelling or edema. Calves symmetric in size bilaterally.  Pulmonary/Chest: Effort normal and breath sounds normal. No accessory muscle usage. No respiratory distress. She exhibits no tenderness.  No increased work of breathing. No accessory muscle use. Patient is sitting upright, speaking in full sentences without difficulty   Abdominal: Soft. Bowel sounds are normal. She exhibits no distension. There is no tenderness. There is no rigidity, no rebound, no guarding and no CVA tenderness.  Musculoskeletal: She exhibits no edema.  Lymphadenopathy:    She has no cervical adenopathy.  Neurological: She is alert.  Speech clear. Follows commands. No facial droop. PERRLA. EOM grossly intact. CN III-XII grossly intact. Grossly moves all extremities 4 without ataxia. Able and appropriate  strength for age to upper and lower extremities bilaterally  Skin: Skin is warm, dry and intact. Capillary refill takes less than 2 seconds. No petechiae, no purpura and no rash noted. She is not diaphoretic.  Psychiatric: She has a normal mood and affect.  Nursing note and vitals reviewed.    ED Treatments / Results  Labs (all labs ordered are listed, but only abnormal results are displayed) Labs Reviewed  RAPID STREP SCREEN (NOT AT [facility])  CULTURE, GROUP A STREP [facility])    EKG  EKG Interpretation None       Radiology No results found.  Procedures Procedures (including critical care time)  Medications Ordered in ED Medications  sodium chloride 0.9 % bolus 1,000 mL (1,000 mLs Intravenous New Bag/Given 01/12/18 0945)  ketorolac (TORADOL) 30 MG/ML injection 30 mg (  30 mg Intravenous Given 01/12/18 0945)  ondansetron (ZOFRAN) injection 4 mg (4 mg Intravenous Given 01/12/18 0945)  oseltamivir (TAMIFLU) capsule 75 mg (75 mg Oral Given 01/12/18 1025)     Initial Impression / Assessment and Plan / ED Course  I have reviewed the triage vital signs and the nursing notes.  Pertinent labs & imaging results that were available during my care of the patient were reviewed by me and considered in my medical decision making (see chart for details).     32 y.o. female presenting with symptoms consistent with influenza after close contact with family member with flu. She did not receive flu vaccine this year. Her vital signs show the patient is tachycardic and she reports decreased oral intake at home. Suspect this is related to dehydration. After fluid bolus, patient tachycardia improved/resolved and she is tolerating PO fluids.  Lungs are clear. Due to patient's presentation and physical exam a chest x-ray was not ordered bc likely diagnosis of flu.  Discussed the cost versus benefit of Tamiflu treatment with the patient.  Patient would like to be treated with Tamiflu. First dose given in the  department. Patient will be discharged with instructions to orally hydrate, rest, and use over-the-counter medications such as anti-inflammatories ibuprofen and Aleve for muscle aches and Tylenol for fever.  Patient will also be given a cough suppressant. She is to follow up with her PCP this week. I discussed strict return precautions with the patient. She is in agreement with the plan and appears safe for discharge.   Final Clinical Impressions(s) / ED Diagnoses   Final diagnoses:  Influenza    ED Discharge Orders        Ordered    oseltamivir (TAMIFLU) 75 MG capsule  Every 12 hours     01/12/18 1028    benzonatate (TESSALON) 100 MG capsule  Every 8 hours     01/12/18 1028    ondansetron (ZOFRAN) 4 MG tablet  Every 8 hours PRN     01/12/18 1028       Princella PellegriniMaczis, Jacelynn Hayton M, PA-C 01/12/18 1029    Bethann BerkshireZammit, Joseph, MD 01/12/18 1107

## 2018-01-12 NOTE — ED Notes (Signed)
Pt tolerating fluids at this time.  

## 2018-01-12 NOTE — Discharge Instructions (Addendum)
Please read and follow all provided instructions.  You were seen here today for your symptoms. Your diagnosis today is Influenza (Flu).   Take medications as prescribed. Followup with your doctor in regards to your hospital visit. If you do not have a doctor use the resource guide listed below to help he find one. You may return to the emergency department if symptoms worsen, become progressive, or become more concerning. Read below to learn more about your diagnosis & reasons to return. Use Tylenol to treat your fever. Use anti-inflammatory medications such as ibuprofen and Aleve for body aches. Use Tessalon as directed for Cough. Take Tamiflu as directed. This medication will help decrease the duration of your symptoms. It can cause Nasuea and Vomiting.   Influenza, Adult  Influenza (flu) starts suddenly, usually with a fever. It causes chills, a dry-hacking cough, headache, body aches, and sore throat. Influenza spreads easily from one person to another. Risk of transmission is lowered by getting the flu shot each year.  HOME CARE  Only take medicines as told by your doctor.  Rest.  Drink enough fluids to keep your pee (urine) clear or pale yellow.  Wash your hands often. Do this after you blow your nose, after you go to the bathroom, and before you touch food.  GET HELP RIGHT AWAY IF (return to the ED) You have shortness of breath while resting.  You have pain or pressure in the chest or belly (abdomen).  You suddenly feel dizzy.  You feel confused.  You have a hard time breathing.  Your skin or nails turn bluish in color.  You get a bad neck pain or stiffness.  You get a bad headache, face pain, or earache.  You throw up (vomit) a lot and often.  You have a fever > 101 that persists  Additional Information:  Your vital signs today were: BP 130/86 (BP Location: Left Arm)    Pulse (!) 115    Temp 98.9 F (37.2 C) (Oral)    Resp 20    Ht 5\' 6"  (1.676 m)    Wt 68 kg (150 lb)    LMP  12/24/2017    SpO2 100%    BMI 24.21 kg/m  If your blood pressure (BP) was elevated above 135/85 this visit, please have this repeated by your doctor within one month. ---------------

## 2018-01-15 LAB — CULTURE, GROUP A STREP (THRC)

## 2018-04-02 ENCOUNTER — Emergency Department (HOSPITAL_COMMUNITY)
Admission: EM | Admit: 2018-04-02 | Discharge: 2018-04-02 | Disposition: A | Discharge status: Home / Self Care | Payer: Medicaid Other | Attending: Emergency Medicine | Admitting: Emergency Medicine

## 2018-04-02 ENCOUNTER — Encounter (HOSPITAL_COMMUNITY): Payer: Self-pay

## 2018-04-02 DIAGNOSIS — Z79899 Other long term (current) drug therapy: Secondary | ICD-10-CM | POA: Insufficient documentation

## 2018-04-02 DIAGNOSIS — J069 Acute upper respiratory infection, unspecified: Secondary | ICD-10-CM | POA: Diagnosis not present

## 2018-04-02 DIAGNOSIS — I10 Essential (primary) hypertension: Secondary | ICD-10-CM | POA: Insufficient documentation

## 2018-04-02 DIAGNOSIS — R0981 Nasal congestion: Secondary | ICD-10-CM | POA: Diagnosis present

## 2018-04-02 MED ORDER — FLUTICASONE PROPIONATE 50 MCG/ACT NA SUSP: 1.0000 | Freq: Every day | NASAL | 0 refills | Status: AC

## 2018-04-02 MED ORDER — BENZONATATE 100 MG PO CAPS: 100.0000 mg | ORAL_CAPSULE | Freq: Three times a day (TID) | ORAL | 0 refills | Status: AC

## 2018-04-02 NOTE — ED Triage Notes (Signed)
Pt reports sneezing, congestion, fever, and body aches since yesterday.

## 2018-04-02 NOTE — ED Provider Notes (Signed)
[facility] EMERGENCY DEPARTMENT Provider Note   CSN: [id number] Arrival date & time: 04/02/18  0754     History   Chief Complaint Chief Complaint  Patient presents with  . URI    HPI Lori HesselbachCarla L Zubiate is a 32 y.o. female presenting for evaluation of nasal congestion, sneezing, and sore throat.  Patient states her symptoms began yesterday.  It started with sneezing and sore throat.  She thought this was due to allergies, and took Zyrtec.  Her symptoms continue to progress, she developed nasal congestion and a fever last night.  T-max 101.  She took NyQuil, which mildly improved her symptoms.  Temperature decreased to 100 today at home.  She has not had any medicine today.  She has associated bilateral eye irritation.  She denies ear pain, headache, sinus pressure, cough, chest pain, shortness of breath, nausea, vomiting, abdominal pain.  No history of asthma or COPD.  She does not smoke cigarettes.  She has no other medical problems, does not take medications daily.  No sick contacts.  HPI  Past Medical History:  Diagnosis Date  . Anxiety   . Bipolar 1 disorder (HCC)   . HYPERTENSION 12/07/2010   only with past pregnancy  . Kidney stones     Patient Active Problem List   Diagnosis Date Noted  . Admission for sterilization 02/04/2013  . UTI (lower urinary tract infection) 01/06/2013  . Rash 01/06/2013  . Back pain 12/01/2012  . Contraception 12/01/2012  . Thyroid nodule 11/04/2012  . Abdominal pain 09/29/2012  . Bipolar II disorder (HCC) 01/15/2011  . 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 ANEMIA, IRON DEFICIENCY, UNSPEC. 02/27/2007  . ANXIETY 02/27/2007    Past Surgical History:  Procedure Laterality Date  . BILATERAL SALPINGECTOMY  02/04/2013   Procedure: BILATERAL SALPINGECTOMY;  Surgeon: Allie BossierMyra C Dove, MD;  Location: [facility];  Service: Gynecology;  Laterality: N/A;  . CESAREAN SECTION    . LAPAROSCOPY  02/04/2013   Procedure: LAPAROSCOPY OPERATIVE;   Surgeon: Allie BossierMyra C Dove, MD;  Location: [facility];  Service: Gynecology;  Laterality: N/A;     OB History    Gravida  3   Para  2   Term  2   Preterm      AB  1   Living  2     SAB  1   TAB      Ectopic      Multiple      Live Births               Home Medications    Prior to Admission medications   Medication Sig Start Date End Date Taking? Authorizing Provider  benzonatate (TESSALON) 100 MG capsule Take 1 capsule (100 mg total) by mouth every 8 (eight) hours. 04/02/18   Ranelle Auker, PA-C  cyclobenzaprine (FLEXERIL) 10 MG tablet Take 1 tablet (10 mg total) by mouth 3 (three) times daily as needed for muscle spasms. 09/12/17   Geoffery Lyonselo, Douglas, MD  fluticasone (FLONASE) 50 MCG/ACT nasal spray Place 1 spray into both nostrils daily. 04/02/18   Sariyah Corcino, PA-C  naproxen (NAPROSYN) 500 MG tablet Take 1 tablet (500 mg total) by mouth 2 (two) times daily with a meal. 09/12/17   Geoffery Lyonselo, Douglas, MD  ondansetron (ZOFRAN ODT) 4 MG disintegrating tablet Take 1 tablet (4 mg total) by mouth every 8 (eight) hours as needed for nausea. 05/14/17   Eber HongMiller, Brian, MD  ondansetron (ZOFRAN) 4 MG tablet  Take 1 tablet (4 mg total) by mouth every 8 (eight) hours as needed for nausea or vomiting. 01/12/18   Maczis, Elmer Sow, PA-C  oseltamivir (TAMIFLU) 75 MG capsule Take 1 capsule (75 mg total) by mouth every 12 (twelve) hours. 01/12/18   Maczis, Elmer Sow, PA-C    Family History Family History  Problem Relation Age of Onset  . Cancer Mother     Social History Social History   Tobacco Use  . Smoking status: Never Smoker  . Smokeless tobacco: Never Used  Substance Use Topics  . Alcohol use: Yes    Comment: occasionally  . Drug use: No     Allergies   Patient has no known allergies.   Review of Systems Review of Systems  Constitutional: Positive for fever.  HENT: Positive for congestion and sore throat.   Respiratory: Negative for cough, chest tightness and shortness of  breath.   Cardiovascular: Negative for chest pain.     Physical Exam Updated Vital Signs BP 124/84 (BP Location: Left Arm)   Pulse 81   Temp 99.2 F (37.3 C) (Oral)   Resp 16   Ht 5\' 6"  (1.676 m)   Wt 68 kg (150 lb)   LMP 03/28/2018 (Exact Date)   SpO2 100%   BMI 24.21 kg/m   Physical Exam  Constitutional: She is oriented to person, place, and time. She appears well-developed and well-nourished. No distress.  HENT:  Head: Normocephalic and atraumatic.  Right Ear: Tympanic membrane, external ear and ear canal normal.  Left Ear: Tympanic membrane, external ear and ear canal normal.  Nose: Mucosal edema present. Right sinus exhibits no maxillary sinus tenderness and no frontal sinus tenderness. Left sinus exhibits no maxillary sinus tenderness and no frontal sinus tenderness.  Mouth/Throat: Uvula is midline, oropharynx is clear and moist and mucous membranes are normal. No tonsillar exudate.  Nasal mucosal edema.  OP clear without tonsillar swelling or exudate.  Uvula midline with equal palate rise.  TMs nonerythematous and not bulging bilaterally.  No tenderness palpation of the sinuses.  Eyes: Pupils are equal, round, and reactive to light. Conjunctivae and EOM are normal.  Neck: Normal range of motion.  Cardiovascular: Normal rate, regular rhythm and intact distal pulses.  Pulmonary/Chest: Effort normal and breath sounds normal. She has no decreased breath sounds. She has no wheezes. She has no rhonchi. She has no rales.  Pt speaking in full sentences without difficulty.  Clear lung sounds in all fields.  Abdominal: Soft. She exhibits no distension. There is no tenderness.  Musculoskeletal: Normal range of motion.  Lymphadenopathy:    She has no cervical adenopathy.  Neurological: She is alert and oriented to person, place, and time.  Skin: Skin is warm.  Psychiatric: She has a normal mood and affect.  Nursing note and vitals reviewed.    ED Treatments / Results   Labs (all labs ordered are listed, but only abnormal results are displayed) Labs Reviewed - No data to display  EKG None  Radiology No results found.  Procedures Procedures (including critical care time)  Medications Ordered in ED Medications - No data to display   Initial Impression / Assessment and Plan / ED Course  I have reviewed the triage vital signs and the nursing notes.  Pertinent labs & imaging results that were available during my care of the patient were reviewed by me and considered in my medical decision making (see chart for details).     Patient presenting with 1 day  h/o URI symptoms.  Physical exam reassuring, patient is afebrile and appears nontoxic.  Pulmonary exam reassuring.  Doubt pneumonia, strep, other bacterial infection, or peritonsillar abscess.  Likely viral URI.  Will treat symptomatically.  Patient to follow-up as needed.  At this time, patient appears safe for discharge.  Return precautions given.  Patient states she understands and agrees to plan.   Final Clinical Impressions(s) / ED Diagnoses   Final diagnoses:  Upper respiratory tract infection, unspecified type    ED Discharge Orders        Ordered    benzonatate (TESSALON) 100 MG capsule  Every 8 hours     04/02/18 0820    fluticasone (FLONASE) 50 MCG/ACT nasal spray  Daily     04/02/18 0820       Alveria Apley, PA-C 04/02/18 0981    Loren Racer, MD 04/02/18 1259

## 2018-04-02 NOTE — Discharge Instructions (Signed)
You likely have a viral illness.  This should be treated symptomatically. Use Tylenol or ibuprofen as needed for fevers or body aches. Use Flonase daily for nasal congestion and cough. Use cough perles as needed for coughing. Make sure you stay well-hydrated with water. Wash your hands frequently to prevent spread of infection. Return to the emergency room if you develop chest pain, difficulty breathing, or any new or worsening symptoms.

## 2018-07-21 ENCOUNTER — Encounter (HOSPITAL_COMMUNITY): Payer: Self-pay | Admitting: Emergency Medicine

## 2018-07-21 ENCOUNTER — Emergency Department (HOSPITAL_COMMUNITY)
Admission: EM | Admit: 2018-07-21 | Discharge: 2018-07-21 | Disposition: A | Discharge status: Home / Self Care | Payer: Medicaid Other | Attending: Emergency Medicine | Admitting: Emergency Medicine

## 2018-07-21 ENCOUNTER — Other Ambulatory Visit: Payer: Self-pay

## 2018-07-21 DIAGNOSIS — M546 Pain in thoracic spine: Secondary | ICD-10-CM | POA: Insufficient documentation

## 2018-07-21 DIAGNOSIS — Z79899 Other long term (current) drug therapy: Secondary | ICD-10-CM | POA: Insufficient documentation

## 2018-07-21 DIAGNOSIS — I10 Essential (primary) hypertension: Secondary | ICD-10-CM | POA: Insufficient documentation

## 2018-07-21 HISTORY — DX: Dorsalgia, unspecified: M54.9

## 2018-07-21 HISTORY — DX: Other chronic pain: G89.29

## 2018-07-21 MED ORDER — KETOROLAC TROMETHAMINE 60 MG/2ML IM SOLN
30.0000 mg | Freq: Once | INTRAMUSCULAR | Status: AC
  Administered 2018-07-21: 30 mg via INTRAMUSCULAR
  Filled 2018-07-21: qty 2

## 2018-07-21 MED ORDER — NAPROXEN 375 MG PO TABS: 375.0000 mg | ORAL_TABLET | Freq: Two times a day (BID) | ORAL | 0 refills | Status: AC

## 2018-07-21 NOTE — ED Provider Notes (Signed)
[facility] EMERGENCY DEPARTMENT Provider Note   CSN: [id number] Arrival date & time: 07/21/18  0815     History   Chief Complaint Chief Complaint  Patient presents with  . Back Pain    HPI Lori Huerta is a 32 y.o. female.  HPI  32 year old female presents with back pain.  Started about 2 or 3 days ago.  It is right thoracic.  She carries a diagnosis of chronic back pain but states she does not have daily back pain.  There was no injury.  The patient states that there is no fever, cough, shortness of breath, chest pain, abdominal pain or weakness/numbness in her extremities.  There is no incontinence.  No urinary symptoms or chance she is pregnant.  She states that she has tried 400 mg ibuprofen intermittently but no significant relief.  She tried a leftover muscle relaxer and she does not know the name of from last year that made her sleepy but when she awoke she still had back pain.  Past Medical History:  Diagnosis Date  . Anxiety   . Bipolar 1 disorder (HCC)   . Chronic back pain   . HYPERTENSION 12/07/2010   only with past pregnancy  . Kidney stones     Patient Active Problem List   Diagnosis Date Noted  . Admission for sterilization 02/04/2013  . UTI (lower urinary tract infection) 01/06/2013  . Rash 01/06/2013  . Back pain 12/01/2012  . Contraception 12/01/2012  . Thyroid nodule 11/04/2012  . Abdominal pain 09/29/2012  . Bipolar II disorder (HCC) 01/15/2011  . 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 ANEMIA, IRON DEFICIENCY, UNSPEC. 02/27/2007  . ANXIETY 02/27/2007    Past Surgical History:  Procedure Laterality Date  . BILATERAL SALPINGECTOMY  02/04/2013   Procedure: BILATERAL SALPINGECTOMY;  Surgeon: Allie Bossier, MD;  Location: [facility];  Service: Gynecology;  Laterality: N/A;  . CESAREAN SECTION    . LAPAROSCOPY  02/04/2013   Procedure: LAPAROSCOPY OPERATIVE;  Surgeon: Allie Bossier, MD;  Location: [facility];  Service: Gynecology;   Laterality: N/A;     OB History    Gravida  3   Para  2   Term  2   Preterm      AB  1   Living  2     SAB  1   TAB      Ectopic      Multiple      Live Births               Home Medications    Prior to Admission medications   Medication Sig Start Date End Date Taking? Authorizing Provider  benzonatate (TESSALON) 100 MG capsule Take 1 capsule (100 mg total) by mouth every 8 (eight) hours. 04/02/18   Caccavale, Sophia, PA-C  cyclobenzaprine (FLEXERIL) 10 MG tablet Take 1 tablet (10 mg total) by mouth 3 (three) times daily as needed for muscle spasms. 09/12/17   Geoffery Lyons, MD  fluticasone (FLONASE) 50 MCG/ACT nasal spray Place 1 spray into both nostrils daily. 04/02/18   Caccavale, Sophia, PA-C  naproxen (NAPROSYN) 375 MG tablet Take 1 tablet (375 mg total) by mouth 2 (two) times daily with a meal. 07/21/18   Pricilla Loveless, MD  ondansetron (ZOFRAN ODT) 4 MG disintegrating tablet Take 1 tablet (4 mg total) by mouth every 8 (eight) hours as needed for nausea. 05/14/17   Eber Hong, MD  ondansetron (ZOFRAN) 4 MG tablet Take  1 tablet (4 mg total) by mouth every 8 (eight) hours as needed for nausea or vomiting. 01/12/18   Maczis, Elmer Sow, PA-C  oseltamivir (TAMIFLU) 75 MG capsule Take 1 capsule (75 mg total) by mouth every 12 (twelve) hours. 01/12/18   Maczis, Elmer Sow, PA-C    Family History Family History  Problem Relation Age of Onset  . Cancer Mother     Social History Social History   Tobacco Use  . Smoking status: Never Smoker  . Smokeless tobacco: Never Used  Substance Use Topics  . Alcohol use: Yes    Comment: occasionally  . Drug use: No     Allergies   Patient has no known allergies.   Review of Systems Review of Systems  Constitutional: Negative for fever.  Respiratory: Negative for cough and shortness of breath.   Cardiovascular: Negative for chest pain.  Gastrointestinal: Negative for abdominal pain and vomiting.  Genitourinary:  Negative for dysuria and hematuria.  Musculoskeletal: Positive for back pain.  Neurological: Negative for weakness and numbness.  All other systems reviewed and are negative.    Physical Exam Updated Vital Signs BP 111/62 (BP Location: Left Arm)   Pulse (!) 102   Temp 98.8 F (37.1 C) (Oral)   Resp 16   Ht 5\' 6"  (1.676 m)   Wt 68 kg (150 lb)   LMP 07/11/2018 (Approximate)   SpO2 100%   BMI 24.21 kg/m   Physical Exam  Constitutional: She is oriented to person, place, and time. She appears well-developed and well-nourished. No distress.  HENT:  Head: Normocephalic and atraumatic.  Right Ear: External ear normal.  Left Ear: External ear normal.  Nose: Nose normal.  Eyes: Right eye exhibits no discharge. Left eye exhibits no discharge.  Cardiovascular: Normal rate, regular rhythm and normal heart sounds.  Pulmonary/Chest: Effort normal and breath sounds normal.  Abdominal: Soft. There is no tenderness.  Musculoskeletal:       Cervical back: She exhibits no tenderness.       Thoracic back: She exhibits tenderness. She exhibits no bony tenderness.       Lumbar back: She exhibits no tenderness and no bony tenderness.       Back:  Neurological: She is alert and oriented to person, place, and time.  5/5 strength in all 4 extremities  Skin: Skin is warm and dry. She is not diaphoretic.  Nursing note and vitals reviewed.    ED Treatments / Results  Labs (all labs ordered are listed, but only abnormal results are displayed) Labs Reviewed - No data to display  EKG None  Radiology No results found.  Procedures Procedures (including critical care time)  Medications Ordered in ED Medications  ketorolac (TORADOL) injection 30 mg (has no administration in time range)     Initial Impression / Assessment and Plan / ED Course  I have reviewed the triage vital signs and the nursing notes.  Pertinent labs & imaging results that were available during my care of the patient  were reviewed by me and considered in my medical decision making (see chart for details).     Patient's pain is most likely muscular.  She has clear lungs, no cough or chest pain.  I highly doubt pneumothorax, PE, pneumonia, or other acute intrathoracic pathology.  Her pain is well above CVA and I highly doubt intra-abdominal emergency such as renal colic, pyelonephritis, etc.  I think this is likely muscular and should improve on its own.  I will prescribe  her naproxen instead and advised her to use heat, Tylenol, and back exercises.  We discussed return precautions but I have very low suspicion for a spinal emergency.  Final Clinical Impressions(s) / ED Diagnoses   Final diagnoses:  Acute right-sided thoracic back pain    ED Discharge Orders        Ordered    naproxen (NAPROSYN) 375 MG tablet  2 times daily with meals     07/21/18 0930       Pricilla LovelessGoldston, Erhard Senske, MD 07/21/18 [phone]

## 2018-07-21 NOTE — ED Triage Notes (Signed)
Pt c/o upper back pain x 2-3 days. States pain worsens with movement and lying down. Denies recent injury. Reports relief with muscle relaxer. Hx of chronic back pain.

## 2018-07-21 NOTE — Discharge Instructions (Signed)
If you develop worsening back pain, fever, vomiting, abdominal pain, chest pain, trouble breathing, numbness or weakness in your arms or legs, incontinence or any other new/concerning symptoms and return to the ER for evaluation.  Do not take other nonsteroidal anti-inflammatories (NSAIDS) while on the naproxen.  This includes ibuprofen, Advil, Motrin, Aleve.  You may take Tylenol and apply heat to the sore area.

## 2018-09-18 ENCOUNTER — Other Ambulatory Visit: Payer: Self-pay

## 2018-09-18 ENCOUNTER — Emergency Department (HOSPITAL_COMMUNITY)
Admission: EM | Admit: 2018-09-18 | Discharge: 2018-09-18 | Disposition: A | Discharge status: Home / Self Care | Payer: Medicaid Other | Attending: Emergency Medicine | Admitting: Emergency Medicine

## 2018-09-18 ENCOUNTER — Encounter (HOSPITAL_COMMUNITY): Payer: Self-pay | Admitting: Emergency Medicine

## 2018-09-18 DIAGNOSIS — Z79899 Other long term (current) drug therapy: Secondary | ICD-10-CM | POA: Diagnosis not present

## 2018-09-18 DIAGNOSIS — H53149 Visual discomfort, unspecified: Secondary | ICD-10-CM | POA: Insufficient documentation

## 2018-09-18 DIAGNOSIS — G4489 Other headache syndrome: Secondary | ICD-10-CM | POA: Diagnosis not present

## 2018-09-18 DIAGNOSIS — R51 Headache: Secondary | ICD-10-CM | POA: Diagnosis present

## 2018-09-18 HISTORY — DX: Cluster headache syndrome, unspecified, not intractable: G44.009

## 2018-09-18 MED ORDER — DIPHENHYDRAMINE HCL 50 MG/ML IJ SOLN
12.5000 mg | Freq: Once | INTRAMUSCULAR | Status: AC
Start: 2018-09-18 — End: 2018-09-18
  Administered 2018-09-18: 12.5 mg via INTRAVENOUS
  Filled 2018-09-18: qty 1

## 2018-09-18 MED ORDER — PROCHLORPERAZINE EDISYLATE 10 MG/2ML IJ SOLN
10.0000 mg | Freq: Once | INTRAMUSCULAR | Status: AC
Start: 2018-09-18 — End: 2018-09-18
  Administered 2018-09-18: 10 mg via INTRAVENOUS
  Filled 2018-09-18: qty 2

## 2018-09-18 MED ORDER — KETOROLAC TROMETHAMINE 30 MG/ML IJ SOLN
15.0000 mg | Freq: Once | INTRAMUSCULAR | Status: AC
  Administered 2018-09-18: 15 mg via INTRAVENOUS
  Filled 2018-09-18: qty 1

## 2018-09-18 NOTE — Discharge Instructions (Addendum)
Sure that you are getting plenty of rest and drinking a lot of fluids.  Follow-up with your doctor if not better in 3 or 4 days or having other problems or concerns.

## 2018-09-18 NOTE — ED Notes (Signed)
Patient left before receiving AVS

## 2018-09-18 NOTE — ED Triage Notes (Signed)
Pt reports migraine for three days. Nausea yesterday but denies today. Photosensitivity.

## 2018-09-18 NOTE — ED Provider Notes (Signed)
[facility] EMERGENCY DEPARTMENT Provider Note   CSN: [id number] Arrival date & time: 09/18/18  1106     History   Chief Complaint Chief Complaint  Patient presents with  . Migraine    HPI Lori Huerta is a 32 y.o. female.  HPI  She reports onset of headache 3 days ago, it is persistent and is associated with photophobia.  She denies fever, chills, diaphoresis, shortness of breath, cough, sore throat, ear pain or sinus congestion.  She had a headache similar to this several years ago.  Headache is not gone away with using acetaminophen and ibuprofen.  Yesterday while at work she had to lay on the floor in a darkened room all day because of the discomfort.  She denies stress in her life or trauma which may have contributed to a headache like this.  She does not have chronic headache problems.  Headache is left sided unilateral.  She denies neck or back pain.  She has not had any trouble walking and drove here today, for evaluation.  There are no other known modifying factors.   Past Medical History:  Diagnosis Date  . Anxiety   . Bipolar 1 disorder (HCC)   . Chronic back pain   . Headaches, cluster   . HYPERTENSION 12/07/2010   only with past pregnancy  . Kidney stones     Patient Active Problem List   Diagnosis Date Noted  . Admission for sterilization 02/04/2013  . UTI (lower urinary tract infection) 01/06/2013  . Rash 01/06/2013  . Back pain 12/01/2012  . Contraception 12/01/2012  . Thyroid nodule 11/04/2012  . Abdominal pain 09/29/2012  . Bipolar II disorder (HCC) 01/15/2011  . 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 ANEMIA, IRON DEFICIENCY, UNSPEC. 02/27/2007  . ANXIETY 02/27/2007    Past Surgical History:  Procedure Laterality Date  . BILATERAL SALPINGECTOMY  02/04/2013   Procedure: BILATERAL SALPINGECTOMY;  Surgeon: Allie Bossier, MD;  Location: [facility];  Service: Gynecology;  Laterality: N/A;  . CESAREAN SECTION    . LAPAROSCOPY   02/04/2013   Procedure: LAPAROSCOPY OPERATIVE;  Surgeon: Allie Bossier, MD;  Location: [facility];  Service: Gynecology;  Laterality: N/A;     OB History    Gravida  3   Para  2   Term  2   Preterm      AB  1   Living  2     SAB  1   TAB      Ectopic      Multiple      Live Births               Home Medications    Prior to Admission medications   Medication Sig Start Date End Date Taking? Authorizing Provider  benzonatate (TESSALON) 100 MG capsule Take 1 capsule (100 mg total) by mouth every 8 (eight) hours. 04/02/18   Caccavale, Sophia, PA-C  cyclobenzaprine (FLEXERIL) 10 MG tablet Take 1 tablet (10 mg total) by mouth 3 (three) times daily as needed for muscle spasms. 09/12/17   Geoffery Lyons, MD  fluticasone (FLONASE) 50 MCG/ACT nasal spray Place 1 spray into both nostrils daily. 04/02/18   Caccavale, Sophia, PA-C  naproxen (NAPROSYN) 375 MG tablet Take 1 tablet (375 mg total) by mouth 2 (two) times daily with a meal. 07/21/18   Pricilla Loveless, MD  ondansetron (ZOFRAN ODT) 4 MG disintegrating tablet Take 1 tablet (4 mg total) by mouth every 8 (  eight) hours as needed for nausea. 05/14/17   Eber Hong, MD  ondansetron (ZOFRAN) 4 MG tablet Take 1 tablet (4 mg total) by mouth every 8 (eight) hours as needed for nausea or vomiting. 01/12/18   Maczis, Elmer Sow, PA-C  oseltamivir (TAMIFLU) 75 MG capsule Take 1 capsule (75 mg total) by mouth every 12 (twelve) hours. 01/12/18   Maczis, Elmer Sow, PA-C    Family History Family History  Problem Relation Age of Onset  . Cancer Mother     Social History Social History   Tobacco Use  . Smoking status: Never Smoker  . Smokeless tobacco: Never Used  Substance Use Topics  . Alcohol use: Yes    Comment: occasionally  . Drug use: No     Allergies   Patient has no known allergies.   Review of Systems Review of Systems  All other systems reviewed and are negative.    Physical Exam Updated Vital Signs BP 108/88   Pulse  66   Temp 98.4 F (36.9 C) (Oral)   Resp 12   Ht 5\' 6"  (1.676 m)   Wt 68 kg   LMP 09/17/2018   SpO2 100%   BMI 24.21 kg/m   Physical Exam  Constitutional: She is oriented to person, place, and time. She appears well-developed and well-nourished.  HENT:  Head: Normocephalic and atraumatic.  Eyes: Pupils are equal, round, and reactive to light. Conjunctivae and EOM are normal.  Neck: Normal range of motion and phonation normal. Neck supple.  Cardiovascular: Normal rate and regular rhythm.  Pulmonary/Chest: Effort normal and breath sounds normal. She exhibits no tenderness.  Abdominal: Soft. She exhibits no distension. There is no tenderness. There is no guarding.  Musculoskeletal: Normal range of motion.  Neurological: She is alert and oriented to person, place, and time. She exhibits normal muscle tone.  No dysarthria, aphasia or nystagmus.  No pronator drift.  No ataxia.  Skin: Skin is warm and dry.  Psychiatric: She has a normal mood and affect. Her behavior is normal. Judgment and thought content normal.  Nursing note and vitals reviewed.    ED Treatments / Results  Labs (all labs ordered are listed, but only abnormal results are displayed) Labs Reviewed - No data to display  EKG None  Radiology No results found.  Procedures Procedures (including critical care time)  Medications Ordered in ED Medications - No data to display   Initial Impression / Assessment and Plan / ED Course  I have reviewed the triage vital signs and the nursing notes.  Pertinent labs & imaging results that were available during my care of the patient were reviewed by me and considered in my medical decision making (see chart for details).      Patient Vitals for the past 24 hrs:  BP Temp Temp src Pulse Resp SpO2 Height Weight  09/18/18 1300 126/89 - - 78 16 100 % - -  09/18/18 1115 108/88 - - 66 12 100 % - -  09/18/18 1112 - - - - - - 5\' 6"  (1.676 m) 68 kg  09/18/18 1109 130/90 98.4  F (36.9 C) Oral 77 16 100 % - -    3:55 PM Reevaluation with update and discussion. After initial assessment and treatment, an updated evaluation reveals she is comfortable now and states her headache is improved.  She left prior to receiving discharge instructions Mancel Bale   Medical Decision Making: Nonspecific cephalalgia.  Doubt intracranial bleeding, meningitis or cervical radiculopathy as a  cause.  CRITICAL CARE-no Performed by: Mancel BaleElliott Zed Wanninger  Nursing Notes Reviewed/ Care Coordinated Applicable Imaging Reviewed Interpretation of Laboratory Data incorporated into ED treatment  The patient appears reasonably screened and/or stabilized for discharge and I doubt any other medical condition or other [facility] requiring further screening, evaluation, or treatment in the ED at this time prior to discharge.  Plan: Home Medications-OTC analgesia PRN; Home Treatments-gradually advance activity; return here if the recommended treatment, does not improve the symptoms; Recommended follow up-PCP, PRN   Final Clinical Impressions(s) / ED Diagnoses   Final diagnoses:  None    ED Discharge Orders    None       Mancel BaleWentz, Jackalynn Art, MD 09/18/18 2213

## 2019-01-11 ENCOUNTER — Other Ambulatory Visit: Payer: Self-pay

## 2019-01-11 ENCOUNTER — Emergency Department (HOSPITAL_COMMUNITY)
Admission: EM | Admit: 2019-01-11 | Discharge: 2019-01-11 | Disposition: A | Discharge status: Home / Self Care | Payer: No Typology Code available for payment source | Attending: Emergency Medicine | Admitting: Emergency Medicine

## 2019-01-11 ENCOUNTER — Encounter (HOSPITAL_COMMUNITY): Payer: Self-pay

## 2019-01-11 DIAGNOSIS — Y9389 Activity, other specified: Secondary | ICD-10-CM | POA: Diagnosis not present

## 2019-01-11 DIAGNOSIS — T148XXA Other injury of unspecified body region, initial encounter: Secondary | ICD-10-CM

## 2019-01-11 DIAGNOSIS — S3992XA Unspecified injury of lower back, initial encounter: Secondary | ICD-10-CM | POA: Diagnosis present

## 2019-01-11 DIAGNOSIS — F419 Anxiety disorder, unspecified: Secondary | ICD-10-CM | POA: Diagnosis not present

## 2019-01-11 DIAGNOSIS — S39012A Strain of muscle, fascia and tendon of lower back, initial encounter: Secondary | ICD-10-CM | POA: Diagnosis not present

## 2019-01-11 DIAGNOSIS — Y998 Other external cause status: Secondary | ICD-10-CM | POA: Insufficient documentation

## 2019-01-11 DIAGNOSIS — Y92410 Unspecified street and highway as the place of occurrence of the external cause: Secondary | ICD-10-CM | POA: Insufficient documentation

## 2019-01-11 DIAGNOSIS — F319 Bipolar disorder, unspecified: Secondary | ICD-10-CM | POA: Diagnosis not present

## 2019-01-11 DIAGNOSIS — I10 Essential (primary) hypertension: Secondary | ICD-10-CM | POA: Diagnosis not present

## 2019-01-11 MED ORDER — CYCLOBENZAPRINE HCL 5 MG PO TABS: 5.0000 mg | ORAL_TABLET | Freq: Three times a day (TID) | ORAL | 0 refills | Status: AC | PRN
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
## 2019-06-11 ENCOUNTER — Emergency Department (HOSPITAL_COMMUNITY): Payer: No Typology Code available for payment source

## 2019-06-11 ENCOUNTER — Emergency Department (HOSPITAL_COMMUNITY)
Admission: EM | Admit: 2019-06-11 | Discharge: 2019-06-12 | Disposition: A | Discharge status: Home / Self Care | Payer: No Typology Code available for payment source | Attending: Emergency Medicine | Admitting: Emergency Medicine

## 2019-06-11 ENCOUNTER — Other Ambulatory Visit: Payer: Self-pay

## 2019-06-11 ENCOUNTER — Encounter (HOSPITAL_COMMUNITY): Payer: Self-pay

## 2019-06-11 DIAGNOSIS — Y939 Activity, unspecified: Secondary | ICD-10-CM | POA: Insufficient documentation

## 2019-06-11 DIAGNOSIS — Y999 Unspecified external cause status: Secondary | ICD-10-CM | POA: Insufficient documentation

## 2019-06-11 DIAGNOSIS — S01112A Laceration without foreign body of left eyelid and periocular area, initial encounter: Secondary | ICD-10-CM | POA: Insufficient documentation

## 2019-06-11 DIAGNOSIS — Z3202 Encounter for pregnancy test, result negative: Secondary | ICD-10-CM | POA: Insufficient documentation

## 2019-06-11 DIAGNOSIS — Y929 Unspecified place or not applicable: Secondary | ICD-10-CM | POA: Insufficient documentation

## 2019-06-11 DIAGNOSIS — S0083XA Contusion of other part of head, initial encounter: Secondary | ICD-10-CM | POA: Insufficient documentation

## 2019-06-11 DIAGNOSIS — S0181XA Laceration without foreign body of other part of head, initial encounter: Secondary | ICD-10-CM

## 2019-06-11 LAB — POC URINE PREG, ED: Preg Test, Ur: NEGATIVE

## 2019-06-11 MED ORDER — HYDROCODONE-ACETAMINOPHEN 5-325 MG PO TABS
ORAL_TABLET | ORAL | Status: AC
  Filled 2019-06-11: qty 2

## 2019-06-11 MED ORDER — HYDROCODONE-ACETAMINOPHEN 5-325 MG PO TABS
2.0000 | ORAL_TABLET | Freq: Once | ORAL | Status: AC
  Administered 2019-06-11: 2 via ORAL
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
## 2019-06-12 MED ORDER — BACITRACIN ZINC 500 UNIT/GM EX OINT
TOPICAL_OINTMENT | CUTANEOUS | Status: AC
  Filled 2019-06-12: qty 2.7

## 2019-06-12 MED ORDER — BACITRACIN ZINC 500 UNIT/GM EX OINT
TOPICAL_OINTMENT | CUTANEOUS | Status: AC
  Filled 2019-06-12: qty 0.9

## 2019-06-12 NOTE — Discharge Instructions (Addendum)
Local wound care with bacitracin and dressing changes twice daily.  Ibuprofen 600 mg as needed every 6 hours for pain.  Follow-up with your primary doctor if you experience any new and/or concerning symptoms.

## 2019-06-12 NOTE — ED Notes (Signed)
Bacitracin given to pt for wound care.

## 2019-06-12 NOTE — ED Notes (Signed)
Patient transported to CT 

## 2020-06-13 ENCOUNTER — Other Ambulatory Visit: Payer: Self-pay

## 2020-06-13 ENCOUNTER — Emergency Department (HOSPITAL_COMMUNITY)
Admission: EM | Admit: 2020-06-13 | Discharge: 2020-06-13 | Disposition: A | Discharge status: Home / Self Care | Payer: Self-pay | Attending: Emergency Medicine | Admitting: Emergency Medicine

## 2020-06-13 ENCOUNTER — Encounter (HOSPITAL_COMMUNITY): Payer: Self-pay

## 2020-06-13 DIAGNOSIS — J069 Acute upper respiratory infection, unspecified: Secondary | ICD-10-CM | POA: Insufficient documentation

## 2020-06-13 DIAGNOSIS — F319 Bipolar disorder, unspecified: Secondary | ICD-10-CM | POA: Insufficient documentation

## 2020-06-13 LAB — GROUP A STREP BY PCR: Group A Strep by PCR: NOT DETECTED

## 2020-06-13 NOTE — Discharge Instructions (Addendum)
You were seen today for an upper respiratory infection. Use Flonase as directed for your congestion and Mucinex/Robitussin for your cough. Continue to stay well-hydrated. Gargle warm salt water and spit it out for sore throat. Take benadryl or other antihistamine to decrease secretions and for watery itchy eyes. Continued to alternate between Tylenol and ibuprofen for pain. Followup with your primary care doctor in 5-7 days or referral for urgent care for recheck of ongoing symptoms however return to emergency department for emergent changing or worsening of symptoms- example: shortness of breath, chest pain, difficulty swallowing, muffled voice.  

## 2020-06-13 NOTE — ED Triage Notes (Signed)
Pt states she started with a sore throat and fever yesterday that has progressively gotten worse and now has a cough; pt has non-productive cough, pt denies any sob and states she has vomited x one; pt states her highest temp at home has been 100; pt has been taking theraflu

## 2020-06-13 NOTE — ED Provider Notes (Signed)
[facility] EMERGENCY DEPARTMENT Provider Note   CSN: [id number] Arrival date & time: 06/13/20  1929     History Chief Complaint  Patient presents with  . Sore Throat    Lori Huerta is a 34 y.o. female with pertinent past medical history of anxiety, bipolar 1, chronic back pain, headaches cluster, hypertension that presents emergency department today for URI-like symptoms.  States that she had a sore throat and 100 degree fever that started yesterday, has progressively gotten worse and now has a cough.  Patient states the cough is nonproductive.  Patient states that she has a lot of congestion.  Has been taking TheraFlu which has been helping.  Denies any sick contacts.  Patient has gotten Covid vaccines, Pfzizer last dose in April.  Denies any shortness of breath, chest pain, dysphagia, odynophagia, drooling, trismus, muffled voice, headache . Did vomit once today at work, no hematemesis or coffee ground emesis. Denies any nausea currently, no abdominal pain, normal stools. No dysuria or hematuria.   HPI     Past Medical History:  Diagnosis Date  . Anxiety   . Bipolar 1 disorder (HCC)   . Chronic back pain   . Headaches, cluster   . HYPERTENSION 12/07/2010   only with past pregnancy  . Kidney stones     Patient Active Problem List   Diagnosis Date Noted  . Admission for sterilization 02/04/2013  . UTI (lower urinary tract infection) 01/06/2013  . Rash 01/06/2013  . Back pain 12/01/2012  . Contraception 12/01/2012  . Thyroid nodule 11/04/2012  . Abdominal pain 09/29/2012  . Bipolar II disorder (HCC) 01/15/2011  . 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 ANEMIA, IRON DEFICIENCY, UNSPEC. 02/27/2007  . ANXIETY 02/27/2007    Past Surgical History:  Procedure Laterality Date  . BILATERAL SALPINGECTOMY  02/04/2013   Procedure: BILATERAL SALPINGECTOMY;  Surgeon: Allie Bossier, MD;  Location: [facility];  Service: Gynecology;  Laterality: N/A;  . CESAREAN  SECTION    . LAPAROSCOPY  02/04/2013   Procedure: LAPAROSCOPY OPERATIVE;  Surgeon: Allie Bossier, MD;  Location: [facility];  Service: Gynecology;  Laterality: N/A;     OB History    Gravida  3   Para  2   Term  2   Preterm      AB  1   Living  2     SAB  1   TAB      Ectopic      Multiple      Live Births              Family History  Problem Relation Age of Onset  . Cancer Mother     Social History   Tobacco Use  . Smoking status: Never Smoker  . Smokeless tobacco: Never Used  Vaping Use  . Vaping Use: Never used  Substance Use Topics  . Alcohol use: Yes    Comment: occasionally  . Drug use: No    Home Medications Prior to Admission medications   Medication Sig Start Date End Date Taking? Authorizing Provider  benzonatate (TESSALON) 100 MG capsule Take 1 capsule (100 mg total) by mouth every 8 (eight) hours. Patient not taking: Reported on 09/18/2018 04/02/18   Caccavale, Sophia, PA-C  cyclobenzaprine (FLEXERIL) 5 MG tablet Take 1 tablet (5 mg total) by mouth 3 (three) times daily as needed for muscle spasms. 01/11/19   Burgess Amor, PA-C  fluticasone (FLONASE) 50 MCG/ACT nasal spray Place  1 spray into both nostrils daily. Patient not taking: Reported on 09/18/2018 04/02/18   Caccavale, Sophia, PA-C  naproxen (NAPROSYN) 375 MG tablet Take 1 tablet (375 mg total) by mouth 2 (two) times daily with a meal. Patient not taking: Reported on 09/18/2018 07/21/18   Pricilla Loveless, MD  ondansetron (ZOFRAN ODT) 4 MG disintegrating tablet Take 1 tablet (4 mg total) by mouth every 8 (eight) hours as needed for nausea. Patient not taking: Reported on 09/18/2018 05/14/17   Eber Hong, MD  ondansetron (ZOFRAN) 4 MG tablet Take 1 tablet (4 mg total) by mouth every 8 (eight) hours as needed for nausea or vomiting. Patient not taking: Reported on 09/18/2018 01/12/18   Maczis, Elmer Sow, PA-C  oseltamivir (TAMIFLU) 75 MG capsule Take 1 capsule (75 mg total) by mouth every 12 (twelve)  hours. Patient not taking: Reported on 09/18/2018 01/12/18   Jacinto Halim, PA-C    Allergies    Patient has no known allergies.  Review of Systems   Review of Systems  Constitutional: Negative for diaphoresis, fatigue and fever.  HENT: Positive for congestion and sore throat. Negative for dental problem, drooling, ear discharge, ear pain, facial swelling, hearing loss, mouth sores, nosebleeds, postnasal drip, rhinorrhea, sinus pressure, sinus pain, sneezing, tinnitus, trouble swallowing and voice change.   Eyes: Negative for visual disturbance.  Respiratory: Negative for shortness of breath.   Cardiovascular: Negative for chest pain.  Gastrointestinal: Negative for nausea and vomiting.  Musculoskeletal: Negative for back pain and myalgias.  Skin: Negative for color change, pallor, rash and wound.  Neurological: Negative for syncope, weakness, light-headedness, numbness and headaches.  Psychiatric/Behavioral: Negative for behavioral problems and confusion.    Physical Exam Updated Vital Signs BP 126/77 (BP Location: Left Arm)   Pulse 85   Temp 98.5 F (36.9 C) (Oral)   Resp 18   Ht 5\' 6"  (1.676 m)   Wt 80.9 kg   SpO2 100%   BMI 28.78 kg/m   Physical Exam Constitutional:      General: She is not in acute distress.    Appearance: Normal appearance. She is not ill-appearing, toxic-appearing or diaphoretic.     Comments: Patient without acute respiratory stress.  Patient is sitting comfortably in bed, no tripoding, use of accessory muscles.  Patient is speaking to me in full sentences.  Handling secretions well.  HENT:     Head: Normocephalic and atraumatic.     Jaw: There is normal jaw occlusion. No trismus, swelling or malocclusion.     Nose: Congestion present. No rhinorrhea.     Right Sinus: No maxillary sinus tenderness or frontal sinus tenderness.     Left Sinus: No maxillary sinus tenderness or frontal sinus tenderness.     Mouth/Throat:     Mouth: Mucous membranes  are moist. No oral lesions.     Dentition: Normal dentition.     Tongue: No lesions.     Palate: No mass and lesions.     Pharynx: Oropharynx is clear. Uvula midline. No pharyngeal swelling, oropharyngeal exudate, posterior oropharyngeal erythema or uvula swelling.     Tonsils: No tonsillar exudate or tonsillar abscesses. 1+ on the right. 1+ on the left.     Comments: Patient without tonsillar enlargement or exudate.  No signs of peritonsillar abscess, palate without any tenderness or masses palpated.  No swelling under the tongue, uvula is midline without any inflammation. Eyes:     General: No visual field deficit.       Right  eye: No discharge.        Left eye: No discharge.     Extraocular Movements: Extraocular movements intact.     Conjunctiva/sclera: Conjunctivae normal.     Pupils: Pupils are equal, round, and reactive to light.  Cardiovascular:     Rate and Rhythm: Normal rate and regular rhythm.     Pulses: Normal pulses.     Heart sounds: Normal heart sounds. No murmur heard.  No friction rub. No gallop.   Pulmonary:     Effort: Pulmonary effort is normal. No respiratory distress.     Breath sounds: Normal breath sounds. No stridor. No wheezing, rhonchi or rales.  Chest:     Chest wall: No tenderness.  Abdominal:     General: Abdomen is flat. Bowel sounds are normal. There is no distension.     Palpations: Abdomen is soft.     Tenderness: There is no abdominal tenderness. There is no right CVA tenderness or left CVA tenderness.  Musculoskeletal:        General: No swelling or tenderness. Normal range of motion.     Cervical back: Normal range of motion. No rigidity or tenderness.     Right lower leg: No edema.     Left lower leg: No edema.  Lymphadenopathy:     Cervical: No cervical adenopathy.  Skin:    General: Skin is warm and dry.     Capillary Refill: Capillary refill takes less than 2 seconds.     Findings: No erythema or rash.  Neurological:     General: No  focal deficit present.     Mental Status: She is alert and oriented to person, place, and time.     Cranial Nerves: Cranial nerves are intact. No cranial nerve deficit or facial asymmetry.     Motor: Motor function is intact. No weakness.     Coordination: Coordination is intact.     Gait: Gait is intact. Gait normal.  Psychiatric:        Mood and Affect: Mood normal.     ED Results / Procedures / Treatments   Labs (all labs ordered are listed, but only abnormal results are displayed) Labs Reviewed  GROUP A STREP BY PCR    EKG None  Radiology No results found.  Procedures Procedures (including critical care time)  Medications Ordered in ED Medications - No data to display  ED Course  I have reviewed the triage vital signs and the nursing notes.  Pertinent labs & imaging results that were available during my care of the patient were reviewed by me and considered in my medical decision making (see chart for details).    MDM Rules/Calculators/A&P                          Lori Huerta is a 34 y.o. female with pertinent past medical history of anxiety, bipolar 1, chronic back pain, headaches cluster, hypertension that presents emergency department today for URI-like symptoms. Pt is fully COVID vaccinated. Strep negative. Pt most likely with viral URI. No signs of Ludwigs, Peritonsillar Abscess, deep space infection, mono. Pt without any resp compromise. No tonsillar erythema or enlargement. No sick contacts. Most likley URI, pt agreeable for discharge.  .Doubt need for further emergent work up at this time. I explained the diagnosis and have given explicit precautions to return to the ER including for any other new or worsening symptoms. The patient understands and accepts the medical plan  as it's been dictated and I have answered their questions. Discharge instructions concerning home care and prescriptions have been given. The patient is STABLE and is discharged to home in  good condition.   Final Clinical Impression(s) / ED Diagnoses Final diagnoses:  Viral upper respiratory tract infection    Rx / DC Orders ED Discharge Orders    None       Farrel Gordon, PA-C 06/14/20 1320    Bethann Berkshire, MD 06/14/20 2231

## 2021-09-20 ENCOUNTER — Emergency Department (HOSPITAL_COMMUNITY)
Admission: EM | Admit: 2021-09-20 | Discharge: 2021-09-20 | Disposition: A | Discharge status: Home / Self Care | Payer: 59 | Attending: Emergency Medicine | Admitting: Emergency Medicine

## 2021-09-20 ENCOUNTER — Other Ambulatory Visit: Payer: Self-pay

## 2021-09-20 ENCOUNTER — Encounter (HOSPITAL_COMMUNITY): Payer: Self-pay

## 2021-09-20 DIAGNOSIS — K625 Hemorrhage of anus and rectum: Secondary | ICD-10-CM | POA: Diagnosis not present

## 2021-09-20 DIAGNOSIS — K921 Melena: Secondary | ICD-10-CM

## 2021-09-20 LAB — CBC WITH DIFFERENTIAL/PLATELET
Abs Immature Granulocytes: 0.01 10*3/uL (ref 0.00–0.07)
Basophils Absolute: 0 10*3/uL (ref 0.0–0.1)
Basophils Relative: 1 %
Eosinophils Absolute: 0.1 10*3/uL (ref 0.0–0.5)
Eosinophils Relative: 1 %
HCT: 38.4 % (ref 36.0–46.0)
Hemoglobin: 13 g/dL (ref 12.0–15.0)
Immature Granulocytes: 0 %
Lymphocytes Relative: 28 %
Lymphs Abs: 1.5 10*3/uL (ref 0.7–4.0)
MCH: 30.2 pg (ref 26.0–34.0)
MCHC: 33.9 g/dL (ref 30.0–36.0)
MCV: 89.3 fL (ref 80.0–100.0)
Monocytes Absolute: 0.3 10*3/uL (ref 0.1–1.0)
Monocytes Relative: 6 %
Neutro Abs: 3.4 10*3/uL (ref 1.7–7.7)
Neutrophils Relative %: 64 %
Platelets: 325 10*3/uL (ref 150–400)
RBC: 4.3 MIL/uL (ref 3.87–5.11)
RDW: 13.2 % (ref 11.5–15.5)
WBC: 5.3 10*3/uL (ref 4.0–10.5)
nRBC: 0 % (ref 0.0–0.2)

## 2021-09-20 LAB — COMPREHENSIVE METABOLIC PANEL
ALT: 15 U/L (ref 0–44)
AST: 15 U/L (ref 15–41)
Albumin: 4.1 g/dL (ref 3.5–5.0)
Alkaline Phosphatase: 83 U/L (ref 38–126)
Anion gap: 6 (ref 5–15)
BUN: 9 mg/dL (ref 6–20)
CO2: 24 mmol/L (ref 22–32)
Calcium: 9.1 mg/dL (ref 8.9–10.3)
Chloride: 105 mmol/L (ref 98–111)
Creatinine, Ser: 0.89 mg/dL (ref 0.44–1.00)
GFR, Estimated: >60 mL/min (ref >60)
Glucose, Bld: 94 mg/dL (ref 70–99)
Potassium: 3.9 mmol/L (ref 3.5–5.1)
Sodium: 135 mmol/L (ref 135–145)
Total Bilirubin: 0.5 mg/dL (ref 0.3–1.2)
Total Protein: 8 g/dL (ref 6.5–8.1)

## 2021-09-20 LAB — TYPE AND SCREEN
ABO/RH(D): B POS
Antibody Screen: NEGATIVE

## 2021-09-20 LAB — PROTIME-INR
INR: 1 (ref 0.8–1.2)
Prothrombin Time: 12.9 seconds (ref 11.4–15.2)

## 2021-09-20 LAB — POC URINE PREG, ED: Preg Test, Ur: NEGATIVE

## 2021-09-20 LAB — POC OCCULT BLOOD, ED: Fecal Occult Bld: NEGATIVE

## 2021-09-20 MED ORDER — HYDROCORTISONE ACETATE 25 MG RE SUPP: 25.0000 mg | Freq: Two times a day (BID) | RECTAL | 0 refills | Status: AC

## 2021-09-20 NOTE — ED Triage Notes (Signed)
Pt states bleeding from rectum for several weeks.  Birght red blood per rectum with bowel movements

## 2021-09-20 NOTE — ED Provider Notes (Signed)
[facility] EMERGENCY DEPARTMENT Provider Note   CSN: [id number] Arrival date & time: 09/20/21  0815     History Chief Complaint  Patient presents with   Rectal Bleeding    Lori Huerta is a 35 y.o. female with current active medical history of bipolar disorder presenting for evaluation of rectal bleeding.  She describes seeing bright red blood with bowel movements only which have been symptomatic for the past several weeks.  She does not see blood in between bowel movements.  She also denies abdominal pain, dizziness, flank pain, rectal pain.  She states she has a bowel movement every other day on average, she does not strain to have a bowel movement and denies any rectal pain with bms or anal swelling.  Denies history of hemorrhoids.  She is not on any blood thinning medications.  She also denies any symptoms suggesting GERD or PUD and denies excessive use of NSAIDs or aspirin.  The history is provided by the patient.      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 02/27/2007   ANXIETY 02/27/2007    Past Surgical History:  Procedure Laterality Date   BILATERAL SALPINGECTOMY  02/04/2013   Procedure: BILATERAL SALPINGECTOMY;  Surgeon: Allie Bossier, MD;  Location: [facility];  Service: Gynecology;  Laterality: N/A;   CESAREAN SECTION     LAPAROSCOPY  02/04/2013   Procedure: LAPAROSCOPY OPERATIVE;  Surgeon: Allie Bossier, MD;  Location:  [facility];  Service: Gynecology;  Laterality: N/A;     OB History     Gravida  3   Para  2   Term  2   Preterm      AB  1   Living  2      SAB  1   IAB      Ectopic      Multiple      Live Births              Family History  Problem Relation Age of Onset   Cancer Mother     Social History   Tobacco Use   Smoking status: Never   Smokeless tobacco: Never  Vaping Use   Vaping Use: Never used  Substance Use Topics   Alcohol use: Yes    Comment: occasionally   Drug use: No    Home Medications Prior to Admission medications   Medication Sig Start Date End Date Taking? Authorizing Provider  busPIRone (BUSPAR) 5 MG tablet Take 5 mg by mouth 2 (two) times daily. 07/13/21  Yes [provider]  Cariprazine HCl (VRAYLAR PO) Take 1 tablet by mouth daily.   Yes [provider]  hydrocortisone (ANUSOL-HC) 25 MG suppository Place 1 suppository (25 mg total) rectally 2 (two) times daily. 09/20/21  Yes Mayo Owczarzak, Raynelle Fanning, PA-C  SUMAtriptan (IMITREX) 100 MG tablet Take 100 mg by mouth  every 2 (two) hours as needed for migraine. 08/24/21  Yes [provider]  benzonatate (TESSALON) 100 MG capsule Take 1 capsule (100 mg total) by mouth every 8 (eight) hours. Patient not taking: No sig reported 04/02/18   Caccavale, Sophia, PA-C  cyclobenzaprine (FLEXERIL) 5 MG tablet Take 1 tablet (5 mg total) by mouth 3 (three) times daily as needed for muscle spasms. Patient not taking: No sig reported 01/11/19   Burgess Amor, PA-C  fluticasone [facility]) 50 MCG/ACT nasal spray Place 1 spray into both nostrils daily. Patient not taking: No sig reported 04/02/18   Caccavale, Sophia, PA-C  naproxen (NAPROSYN) 375 MG tablet Take 1 tablet (375 mg total) by mouth 2 (two) times daily with a meal. Patient not taking: No sig reported 07/21/18   Pricilla Loveless, MD  ondansetron (ZOFRAN ODT) 4 MG disintegrating tablet Take 1 tablet (4 mg total) by mouth every 8 (eight) hours as needed for  nausea. Patient not taking: No sig reported 05/14/17   Eber Hong, MD  ondansetron (ZOFRAN) 4 MG tablet Take 1 tablet (4 mg total) by mouth every 8 (eight) hours as needed for nausea or vomiting. Patient not taking: No sig reported 01/12/18   Maczis, Elmer Sow, PA-C  oseltamivir (TAMIFLU) 75 MG capsule Take 1 capsule (75 mg total) by mouth every 12 (twelve) hours. Patient not taking: No sig reported 01/12/18   Jacinto Halim, PA-C    Allergies    Patient has no known allergies.  Review of Systems   Review of Systems  Constitutional:  Negative for fever.  HENT:  Negative for congestion.   Eyes: Negative.   Respiratory:  Negative for chest tightness and shortness of breath.   Cardiovascular:  Negative for chest pain.  Gastrointestinal:  Positive for blood in stool. Negative for abdominal distention, abdominal pain, anal bleeding, constipation, diarrhea, nausea, rectal pain and vomiting.  Genitourinary: Negative.   Musculoskeletal:  Negative for arthralgias, joint swelling and neck pain.  Skin: Negative.  Negative for rash and wound.  Neurological:  Negative for dizziness, weakness, light-headedness, numbness and headaches.  Psychiatric/Behavioral: Negative.     Physical Exam Updated Vital Signs BP 121/84   Pulse 74   Temp 98.3 F (36.8 C) (Oral)   Resp 18   Ht 5\' 6"  (1.676 m)   Wt 79.4 kg   SpO2 100%   BMI 28.25 kg/m   Physical Exam Vitals and nursing note reviewed. Exam conducted with a chaperone present.  Constitutional:      Appearance: She is well-developed.  HENT:     Head: Normocephalic and atraumatic.  Eyes:     Conjunctiva/sclera: Conjunctivae normal.  Cardiovascular:     Rate and Rhythm: Normal rate and regular rhythm.     Heart sounds: Normal heart sounds.  Pulmonary:     Effort: Pulmonary effort is normal.     Breath sounds: Normal breath sounds. No wheezing.  Abdominal:     General: Bowel sounds are normal.     Palpations: Abdomen is soft.      Tenderness: There is no abdominal tenderness.  Genitourinary:    Rectum: Guaiac result negative. No anal fissure, external hemorrhoid or internal hemorrhoid.     Comments: Hemoccult negative, no external hemorrhoid, no obvious internal hemorrhoid appreciated. Musculoskeletal:        General: Normal range of motion.     Cervical back: Normal range of motion.  Skin:    General: Skin is warm and dry.  Neurological:  Mental Status: She is alert.    ED Results / Procedures / Treatments   Labs (all labs ordered are listed, but only abnormal results are displayed) Labs Reviewed  COMPREHENSIVE METABOLIC PANEL  CBC WITH DIFFERENTIAL/PLATELET  PROTIME-INR  POC URINE PREG, ED  POC OCCULT BLOOD, ED  TYPE AND SCREEN    EKG None  Radiology No results found.  Procedures Procedures   Medications Ordered in ED Medications - No data to display  ED Course  I have reviewed the triage vital signs and the nursing notes.  Pertinent labs & imaging results that were available during my care of the patient were reviewed by me and considered in my medical decision making (see chart for details).    MDM Rules/Calculators/A&P                           Patient with several week history of hematochezia with bowel movements only.  Her exam today is reassuring as are her laboratory test.  She is Hemoccult negative today, her hemoglobin is normal at 13.0.  Her BUN is also normal, additionally she has no symptoms suggesting upper GI source of her symptoms.  She may have an internal hemorrhoid although this was not appreciated on her exam today.  She was given Anusol for treatment of this possibility.  In the interim she was advised to return here for reevaluation if she develops any worsening symptoms while she is awaiting GI follow-up.  She has been referred to Dr.  Karilyn Cota for follow-up care.  The patient appears reasonably screened and/or stabilized for discharge and I doubt any other medical  condition or other [facility] requiring further screening, evaluation, or treatment in the ED at this time prior to discharge.  No results found.  Final Clinical Impression(s) / ED Diagnoses Final diagnoses:  Hematochezia    Rx / DC Orders ED Discharge Orders          Ordered    hydrocortisone (ANUSOL-HC) 25 MG suppository  2 times daily        09/20/21 1151             Burgess Amor, Cordelia Poche 09/20/21 1154    Mancel Bale, MD 09/21/21 [phone]

## 2021-09-20 NOTE — Discharge Instructions (Signed)
Your lab test today are normal and reassuring.  As discussed you will need follow-up care with a gastroenterologist for further evaluation of the blood you see with your bowel movements.  It is possible this is an internal hemorrhoid that is not appreciated on today's exam.  You are being given a prescription for medication to help treat you for this possible condition.  In the interim call Dr. Karilyn Cota for further evaluation, he will most likely recommend a colonoscopy.  In the interim return here immediately if you develop any worsening symptoms.

## 2021-11-24 ENCOUNTER — Encounter (HOSPITAL_COMMUNITY): Payer: Self-pay

## 2021-11-24 ENCOUNTER — Other Ambulatory Visit: Payer: Self-pay

## 2021-11-24 ENCOUNTER — Emergency Department (HOSPITAL_COMMUNITY)
Admission: EM | Admit: 2021-11-24 | Discharge: 2021-11-24 | Disposition: A | Discharge status: Home / Self Care | Payer: 59 | Attending: Emergency Medicine | Admitting: Emergency Medicine

## 2021-11-24 DIAGNOSIS — R059 Cough, unspecified: Secondary | ICD-10-CM | POA: Diagnosis present

## 2021-11-24 DIAGNOSIS — I10 Essential (primary) hypertension: Secondary | ICD-10-CM | POA: Diagnosis not present

## 2021-11-24 DIAGNOSIS — J3489 Other specified disorders of nose and nasal sinuses: Secondary | ICD-10-CM | POA: Diagnosis not present

## 2021-11-24 DIAGNOSIS — J111 Influenza due to unidentified influenza virus with other respiratory manifestations: Secondary | ICD-10-CM

## 2021-11-24 NOTE — ED Triage Notes (Signed)
Pt presents to ED with complaints of chills, body aches, congestion and fever up to 102 started last night.

## 2021-11-24 NOTE — Discharge Instructions (Signed)
It appears that you have influenza causing your symptoms.  Get plenty of rest and drink a lot of fluids, especially water.  Use Tylenol 650 mg every 4 hours as needed for fever.  Use Robitussin-DM for cough.  Avoid being around people until all of your symptoms are gone +2 days.  He will likely be able to return to work next Wednesday.

## 2021-11-24 NOTE — ED Provider Notes (Signed)
[facility] EMERGENCY DEPARTMENT Provider Note   CSN: [id number] Arrival date & time: 11/24/21  0759     History Chief Complaint  Patient presents with   Fever    Lori Huerta is a 35 y.o. female.  HPI She presents for evaluation of rhinorrhea chills achiness and cough for 2 days.  She is currently employed and scheduled to work today.  She denies vomiting, dizziness or weakness.  She has had COVID vaccines but not an influenza vaccine.  There are no other known active modifying factors.    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 02/27/2007   ANXIETY 02/27/2007    Past Surgical History:  Procedure Laterality Date   BILATERAL SALPINGECTOMY  02/04/2013   Procedure: BILATERAL SALPINGECTOMY;  Surgeon: Allie Bossier, MD;  Location: [facility];  Service: Gynecology;  Laterality: N/A;   CESAREAN SECTION     LAPAROSCOPY  02/04/2013   Procedure: LAPAROSCOPY OPERATIVE;  Surgeon: Allie Bossier, MD;  Location: [facility];  Service: Gynecology;  Laterality: N/A;     OB History     Gravida  3   Para  2   Term  2   Preterm      AB  1   Living  2      SAB  1   IAB      Ectopic      Multiple      Live Births              Family History  Problem Relation Age of Onset   Cancer Mother     Social History   Tobacco Use   Smoking status: Never   Smokeless tobacco: Never  Vaping Use   Vaping Use: Never used   Substance Use Topics   Alcohol use: Yes    Comment: occasionally   Drug use: No    Home Medications Prior to Admission medications   Medication Sig Start Date End Date Taking? Authorizing Provider  benzonatate (TESSALON) 100 MG capsule Take 1 capsule (100 mg total) by mouth every 8 (eight) hours. Patient not taking: No sig reported 04/02/18   Caccavale, Sophia, PA-C  busPIRone (BUSPAR) 5 MG tablet Take 5 mg by mouth 2 (two) times daily. 07/13/21   [provider]  Cariprazine HCl (VRAYLAR PO) Take 1 tablet by mouth daily.    [provider]  cyclobenzaprine (FLEXERIL) 5 MG tablet Take 1 tablet (5 mg total) by mouth 3 (three) times daily as needed for muscle spasms. Patient not taking: No sig reported 01/11/19   Burgess Amor, PA-C  fluticasone [facility]) 50 MCG/ACT nasal spray Place 1 spray into both nostrils daily. Patient not taking: No sig reported 04/02/18   Caccavale, Sophia, PA-C  hydrocortisone (ANUSOL-HC) 25 MG suppository Place 1 suppository (25 mg total) rectally 2 (two) times daily. 09/20/21   Idol,  Raynelle Fanning, PA-C  naproxen (NAPROSYN) 375 MG tablet Take 1 tablet (375 mg total) by mouth 2 (two) times daily with a meal. Patient not taking: No sig reported 07/21/18   Pricilla Loveless, MD  ondansetron (ZOFRAN ODT) 4 MG disintegrating tablet Take 1 tablet (4 mg total) by mouth every 8 (eight) hours as needed for nausea. Patient not taking: No sig reported 05/14/17   Eber Hong, MD  ondansetron (ZOFRAN) 4 MG tablet Take 1 tablet (4 mg total) by mouth every 8 (eight) hours as needed for nausea or vomiting. Patient not taking: No sig reported 01/12/18   Maczis, Elmer Sow, PA-C  oseltamivir (TAMIFLU) 75 MG capsule Take 1 capsule (75 mg total) by mouth every 12 (twelve) hours. Patient not taking: No sig reported 01/12/18   Maczis, Elmer Sow, PA-C  SUMAtriptan (IMITREX) 100 MG tablet Take 100 mg by mouth every 2 (two) hours as needed for migraine. 08/24/21   [provider]     Allergies    Patient has no known allergies.  Review of Systems   Review of Systems  All other systems reviewed and are negative.  Physical Exam Updated Vital Signs BP 125/79 (BP Location: Right Arm)   Pulse (!) 102   Temp 98.7 F (37.1 C) (Oral)   Resp 18   Ht 5\' 6"  (1.676 m)   Wt 72.6 kg   LMP 11/10/2021   SpO2 100%   BMI 25.82 kg/m   Physical Exam Vitals and nursing note reviewed.  Constitutional:      Appearance: She is well-developed. She is not ill-appearing.  HENT:     Head: Normocephalic and atraumatic.     Right Ear: External ear normal.     Left Ear: External ear normal.  Eyes:     Conjunctiva/sclera: Conjunctivae normal.     Pupils: Pupils are equal, round, and reactive to light.  Neck:     Trachea: Phonation normal.  Cardiovascular:     Rate and Rhythm: Normal rate and regular rhythm.  Pulmonary:     Effort: Pulmonary effort is normal. No respiratory distress.     Breath sounds: No stridor.  Abdominal:     General: There is no distension.  Musculoskeletal:        General: Normal range of motion.     Cervical back: Normal range of motion and neck supple.  Skin:    General: Skin is warm and dry.  Neurological:     Mental Status: She is alert and oriented to person, place, and time.     Cranial Nerves: No cranial nerve deficit.     Sensory: No sensory deficit.     Motor: No abnormal muscle tone.     Coordination: Coordination normal.  Psychiatric:        Mood and Affect: Mood normal.        Behavior: Behavior normal.        Thought Content: Thought content normal.        Judgment: Judgment normal.    ED Results / Procedures / Treatments   Labs (all labs ordered are listed, but only abnormal results are displayed) Labs Reviewed - No data to display  EKG None  Radiology No results found.  Procedures Procedures   Medications Ordered in ED Medications - No data to display  ED Course  I have reviewed the triage vital signs and the  nursing notes.  Pertinent labs & imaging results that were available during my care of the patient were reviewed  by me and considered in my medical decision making (see chart for details).    MDM Rules/Calculators/A&P                            Patient Vitals for the past 24 hrs:  BP Temp Temp src Pulse Resp SpO2 Height Weight  11/24/21 0806 125/79 98.7 F (37.1 C) Oral (!) 102 18 100 % 5\' 6"  (1.676 m) 72.6 kg    8:33 AM Reevaluation with update and discussion. After initial assessment and treatment, an updated evaluation reveals no change in clinical status, findings discussed and questions answered.   Medical Decision Making:  This patient is presenting for evaluation of URI symptoms, which does not require a range of treatment options, and is not a complaint that involves a high risk of morbidity and mortality. The differential diagnoses include influenza, COVID, nonspecific viral URI. I decided to review old records, and in summary patient with prior COVID vaccines, presenting with URI symptoms, not vaccinated for influenza.  I did not require additional historical information from anyone.   Critical Interventions-clinical evaluation  After These Interventions, the Patient was reevaluated and was found URI, very low risk for complications, presenting with normal vitals and reassuring evaluation.  Unlikely to be COVID infection.   CRITICAL CARE-no Performed by: Mancel Bale  Nursing Notes Reviewed/ Care Coordinated Applicable Imaging Reviewed Interpretation of Laboratory Data incorporated into ED treatment  The patient appears reasonably screened and/or stabilized for discharge and I doubt any other medical condition or other [facility] requiring further screening, evaluation, or treatment in the ED at this time prior to discharge.  Plan: Home Medications-symptomatic OTC medication; Home Treatments-rest, fluid, isolate; return here if the recommended treatment, does  not improve the symptoms; Recommended follow up-PCP, PRN     Final Clinical Impression(s) / ED Diagnoses Final diagnoses:  Influenza    Rx / DC Orders ED Discharge Orders     None        [facility], MD 11/24/21 [phone]

## 2023-02-01 DIAGNOSIS — G43009 Migraine without aura, not intractable, without status migrainosus: Secondary | ICD-10-CM | POA: Diagnosis not present

## 2023-02-01 DIAGNOSIS — F319 Bipolar disorder, unspecified: Secondary | ICD-10-CM | POA: Diagnosis not present

## 2023-02-01 DIAGNOSIS — Z6823 Body mass index (BMI) 23.0-23.9, adult: Secondary | ICD-10-CM | POA: Diagnosis not present

## 2023-06-12 DIAGNOSIS — R051 Acute cough: Secondary | ICD-10-CM | POA: Diagnosis not present

## 2023-06-12 DIAGNOSIS — J069 Acute upper respiratory infection, unspecified: Secondary | ICD-10-CM | POA: Diagnosis not present

## 2023-06-14 DIAGNOSIS — E663 Overweight: Secondary | ICD-10-CM | POA: Diagnosis not present

## 2023-06-14 DIAGNOSIS — Z6825 Body mass index (BMI) 25.0-25.9, adult: Secondary | ICD-10-CM | POA: Diagnosis not present

## 2023-06-14 DIAGNOSIS — E010 Iodine-deficiency related diffuse (endemic) goiter: Secondary | ICD-10-CM | POA: Diagnosis not present

## 2023-06-14 DIAGNOSIS — G43009 Migraine without aura, not intractable, without status migrainosus: Secondary | ICD-10-CM | POA: Diagnosis not present

## 2023-06-14 DIAGNOSIS — R87611 Atypical squamous cells cannot exclude high grade squamous intraepithelial lesion on cytologic smear of cervix (ASC-H): Secondary | ICD-10-CM | POA: Diagnosis not present

## 2023-06-14 DIAGNOSIS — Z1331 Encounter for screening for depression: Secondary | ICD-10-CM | POA: Diagnosis not present

## 2023-06-14 DIAGNOSIS — Z029 Encounter for administrative examinations, unspecified: Secondary | ICD-10-CM | POA: Diagnosis not present

## 2023-06-14 DIAGNOSIS — F319 Bipolar disorder, unspecified: Secondary | ICD-10-CM | POA: Diagnosis not present

## 2023-06-14 DIAGNOSIS — Z0001 Encounter for general adult medical examination with abnormal findings: Secondary | ICD-10-CM | POA: Diagnosis not present

## 2023-06-17 ENCOUNTER — Other Ambulatory Visit (HOSPITAL_COMMUNITY): Payer: Self-pay | Admitting: Family Medicine

## 2023-06-17 DIAGNOSIS — E010 Iodine-deficiency related diffuse (endemic) goiter: Secondary | ICD-10-CM

## 2023-07-05 ENCOUNTER — Ambulatory Visit (HOSPITAL_COMMUNITY)
Admission: RE | Admit: 2023-07-05 | Discharge: 2023-07-05 | Disposition: A | Discharge status: Home / Self Care | Payer: BC Managed Care – PPO | Source: Ambulatory Visit | Attending: Family Medicine | Admitting: Family Medicine

## 2023-07-05 DIAGNOSIS — E042 Nontoxic multinodular goiter: Secondary | ICD-10-CM | POA: Diagnosis not present

## 2023-07-05 DIAGNOSIS — Z0001 Encounter for general adult medical examination with abnormal findings: Secondary | ICD-10-CM | POA: Diagnosis not present

## 2023-07-05 DIAGNOSIS — E010 Iodine-deficiency related diffuse (endemic) goiter: Secondary | ICD-10-CM

## 2023-11-16 ENCOUNTER — Other Ambulatory Visit: Payer: Self-pay

## 2023-11-16 ENCOUNTER — Emergency Department (HOSPITAL_COMMUNITY): Payer: 59

## 2023-11-16 ENCOUNTER — Emergency Department (HOSPITAL_COMMUNITY)
Admission: EM | Admit: 2023-11-16 | Discharge: 2023-11-16 | Disposition: A | Discharge status: Home / Self Care | Payer: 59 | Attending: Emergency Medicine | Admitting: Emergency Medicine

## 2023-11-16 ENCOUNTER — Encounter (HOSPITAL_COMMUNITY): Payer: Self-pay

## 2023-11-16 DIAGNOSIS — N390 Urinary tract infection, site not specified: Secondary | ICD-10-CM | POA: Insufficient documentation

## 2023-11-16 DIAGNOSIS — R109 Unspecified abdominal pain: Secondary | ICD-10-CM

## 2023-11-16 DIAGNOSIS — R1011 Right upper quadrant pain: Secondary | ICD-10-CM | POA: Diagnosis present

## 2023-11-16 LAB — URINALYSIS, ROUTINE W REFLEX MICROSCOPIC
Bilirubin Urine: NEGATIVE
Glucose, UA: NEGATIVE mg/dL
Ketones, ur: NEGATIVE mg/dL
Leukocytes,Ua: NEGATIVE
Nitrite: POSITIVE — AB
Protein, ur: 30 mg/dL — AB
Specific Gravity, Urine: 1.026 (ref 1.005–1.030)
pH: 6 (ref 5.0–8.0)

## 2023-11-16 LAB — COMPREHENSIVE METABOLIC PANEL
ALT: 13 U/L (ref 0–44)
AST: 15 U/L (ref 15–41)
Albumin: 4.3 g/dL (ref 3.5–5.0)
Alkaline Phosphatase: 70 U/L (ref 38–126)
Anion gap: 7 (ref 5–15)
BUN: 11 mg/dL (ref 6–20)
CO2: 24 mmol/L (ref 22–32)
Calcium: 9.3 mg/dL (ref 8.9–10.3)
Chloride: 106 mmol/L (ref 98–111)
Creatinine, Ser: 0.67 mg/dL (ref 0.44–1.00)
GFR, Estimated: >60 mL/min (ref >60)
Glucose, Bld: 89 mg/dL (ref 70–99)
Potassium: 3.7 mmol/L (ref 3.5–5.1)
Sodium: 137 mmol/L (ref 135–145)
Total Bilirubin: 0.7 mg/dL (ref <1.2)
Total Protein: 8.4 g/dL — ABNORMAL HIGH (ref 6.5–8.1)

## 2023-11-16 LAB — CBC WITH DIFFERENTIAL/PLATELET
Abs Immature Granulocytes: 0.01 10*3/uL (ref 0.00–0.07)
Basophils Absolute: 0 10*3/uL (ref 0.0–0.1)
Basophils Relative: 1 %
Eosinophils Absolute: 0.1 10*3/uL (ref 0.0–0.5)
Eosinophils Relative: 1 %
HCT: 40.5 % (ref 36.0–46.0)
Hemoglobin: 14 g/dL (ref 12.0–15.0)
Immature Granulocytes: 0 %
Lymphocytes Relative: 30 %
Lymphs Abs: 1.7 10*3/uL (ref 0.7–4.0)
MCH: 31.3 pg (ref 26.0–34.0)
MCHC: 34.6 g/dL (ref 30.0–36.0)
MCV: 90.6 fL (ref 80.0–100.0)
Monocytes Absolute: 0.4 10*3/uL (ref 0.1–1.0)
Monocytes Relative: 6 %
Neutro Abs: 3.7 10*3/uL (ref 1.7–7.7)
Neutrophils Relative %: 62 %
Platelets: 329 10*3/uL (ref 150–400)
RBC: 4.47 MIL/uL (ref 3.87–5.11)
RDW: 12.3 % (ref 11.5–15.5)
WBC: 5.8 10*3/uL (ref 4.0–10.5)
nRBC: 0 % (ref 0.0–0.2)

## 2023-11-16 LAB — PREGNANCY, URINE: Preg Test, Ur: NEGATIVE

## 2023-11-16 MED ORDER — CEPHALEXIN 500 MG PO CAPS
500.0000 mg | ORAL_CAPSULE | Freq: Once | ORAL | Status: AC
  Administered 2023-11-16: 500 mg via ORAL
  Filled 2023-11-16: qty 1

## 2023-11-16 MED ORDER — CEPHALEXIN 500 MG PO CAPS: 500.0000 mg | ORAL_CAPSULE | Freq: Two times a day (BID) | ORAL | 0 refills | Status: AC

## 2023-11-16 MED ORDER — KETOROLAC TROMETHAMINE 30 MG/ML IJ SOLN
30.0000 mg | Freq: Once | INTRAMUSCULAR | Status: AC
  Administered 2023-11-16: 30 mg via INTRAMUSCULAR
  Filled 2023-11-16: qty 1

## 2023-11-16 NOTE — ED Triage Notes (Signed)
Pt reports RUQ pain and swelling since Monday.  Pt denies any N/V/D or constipation.  Pt reports it is very tender to the touch.

## 2023-11-16 NOTE — ED Provider Notes (Signed)
[place] EMERGENCY DEPARTMENT AT [facility] Provider Note   CSN: [id number] Arrival date & time: 11/16/23  1124     History  Chief Complaint  Patient presents with   Abdominal Pain    Lori Huerta is a 37 y.o. female.  HPI Patient presents with concern of discomfort in the right upper quadrant, right flank.  Onset was 4 days ago, since that time she has had what is described as numbness, pain, discomfort, unusual sensation throughout the roughly T9/T10 distribution on the right hemithorax.  No rash, no fever, no chills, nausea, vomiting, no dyspnea.  Patient works in a nursing facility.    Home Medications Prior to Admission medications   Medication Sig Start Date End Date Taking? Authorizing Provider  cephALEXin (KEFLEX) 500 MG capsule Take 1 capsule (500 mg total) by mouth 2 (two) times daily for 5 days. 11/16/23 11/21/23 Yes Gerhard Munch, MD  benzonatate (TESSALON) 100 MG capsule Take 1 capsule (100 mg total) by mouth every 8 (eight) hours. Patient not taking: No sig reported 04/02/18   Caccavale, Sophia, PA-C  busPIRone (BUSPAR) 5 MG tablet Take 5 mg by mouth 2 (two) times daily. 07/13/21   [provider]  Cariprazine HCl (VRAYLAR PO) Take 1 tablet by mouth daily.    [provider]  cyclobenzaprine (FLEXERIL) 5 MG tablet Take 1 tablet (5 mg total) by mouth 3 (three) times daily as needed for muscle spasms. Patient not taking: No sig reported 01/11/19   Burgess Amor, PA-C  fluticasone [facility]) 50 MCG/ACT nasal spray Place 1 spray into both nostrils daily. Patient not taking: No sig reported 04/02/18   Caccavale, Sophia, PA-C  hydrocortisone (ANUSOL-HC) 25 MG suppository Place 1 suppository (25 mg total) rectally 2 (two) times daily. 09/20/21   Burgess Amor, PA-C  naproxen (NAPROSYN) 375 MG tablet Take 1 tablet (375 mg total) by mouth 2 (two) times daily with a meal. Patient not taking: No sig reported 07/21/18   Pricilla Loveless, MD  ondansetron  (ZOFRAN ODT) 4 MG disintegrating tablet Take 1 tablet (4 mg total) by mouth every 8 (eight) hours as needed for nausea. Patient not taking: No sig reported 05/14/17   Eber Hong, MD  ondansetron (ZOFRAN) 4 MG tablet Take 1 tablet (4 mg total) by mouth every 8 (eight) hours as needed for nausea or vomiting. Patient not taking: No sig reported 01/12/18   Maczis, Elmer Sow, PA-C  oseltamivir (TAMIFLU) 75 MG capsule Take 1 capsule (75 mg total) by mouth every 12 (twelve) hours. Patient not taking: No sig reported 01/12/18   Maczis, Elmer Sow, PA-C  SUMAtriptan (IMITREX) 100 MG tablet Take 100 mg by mouth every 2 (two) hours as needed for migraine. 08/24/21   [provider]      Allergies    Patient has no known allergies.    Review of Systems   Review of Systems  Physical Exam Updated Vital Signs BP 116/74 (BP Location: Right Arm)   Pulse 91   Temp 98.9 F (37.2 C) (Oral)   Resp 17   Ht 5\' 6"  (1.676 m)   Wt 72.6 kg   LMP 10/24/2023 (Exact Date)   SpO2 100%   BMI 25.82 kg/m  Physical Exam Vitals and nursing note reviewed.  Constitutional:      General: She is not in acute distress.    Appearance: She is well-developed.  HENT:     Head: Normocephalic and atraumatic.  Eyes:     Conjunctiva/sclera: Conjunctivae  normal.  Cardiovascular:     Rate and Rhythm: Normal rate and regular rhythm.  Pulmonary:     Effort: Pulmonary effort is normal. No respiratory distress.     Breath sounds: Normal breath sounds. No stridor.  Chest:    Abdominal:     General: There is no distension.  Skin:    General: Skin is warm and dry.  Neurological:     Mental Status: She is alert and oriented to person, place, and time.     Cranial Nerves: No cranial nerve deficit.  Psychiatric:        Mood and Affect: Mood normal.     ED Results / Procedures / Treatments   Labs (all labs ordered are listed, but only abnormal results are displayed) Labs Reviewed  URINALYSIS, ROUTINE W REFLEX  MICROSCOPIC - Abnormal; Notable for the following components:      Result Value   Color, Urine AMBER (*)    APPearance HAZY (*)    Hgb urine dipstick MODERATE (*)    Protein, ur 30 (*)    Nitrite POSITIVE (*)    Bacteria, UA MANY (*)    All other components within normal limits  COMPREHENSIVE METABOLIC PANEL - Abnormal; Notable for the following components:   Total Protein 8.4 (*)    All other components within normal limits  PREGNANCY, URINE  CBC WITH DIFFERENTIAL/PLATELET    EKG None  Radiology DG Chest 2 View  Result Date: 11/16/2023 CLINICAL DATA:  Right chest and abdominal pain. EXAM: CHEST - 2 VIEW COMPARISON:  09/12/2017 FINDINGS: The heart size and mediastinal contours are within normal limits. Both lungs are clear. The visualized skeletal structures are unremarkable. IMPRESSION: Normal exam. Electronically Signed   By: Danae Orleans M.D.   On: 11/16/2023 12:46    Procedures Procedures    Medications Ordered in ED Medications  cephALEXin (KEFLEX) capsule 500 mg (has no administration in time range)  ketorolac (TORADOL) 30 MG/ML injection 30 mg (30 mg Intramuscular Given 11/16/23 1246)    ED Course/ Medical Decision Making/ A&P                                 Medical Decision Making Adult female presents with right hemithoracic discomfort in the roughly dermatomal distribution.  Patient is awake, alert, afebrile.  Broad differential including atypical pneumonia, musculoskeletal, atypical shingles, hepatobiliary considered. Cardiac 90 sinus normal Pulse ox 100% room air normal   Amount and/or Complexity of Data Reviewed External Data Reviewed: notes. Labs: ordered. Decision-making details documented in ED Course. Radiology: ordered and independent interpretation performed. Decision-making details documented in ED Course.  Risk Prescription drug management.   3:09 PM Urinalysis concerning for infection, with many bacteria, nitrate positive value.  This  abnormalities consistent with patient's flank pain possibly due to infectious etiology.  Patient started Keflex, without evidence for bacteremia, sepsis or other acute findings patient will follow-up with primary care.        Final Clinical Impression(s) / ED Diagnoses Final diagnoses:  Lower urinary tract infectious disease  Flank pain    Rx / DC Orders ED Discharge Orders          Ordered    cephALEXin (KEFLEX) 500 MG capsule  2 times daily        11/16/23 1508              Gerhard Munch, MD 11/16/23 [phone]

## 2023-11-16 NOTE — Discharge Instructions (Signed)
Monitor your condition carefully and do not hesitate to return here for concerning changes. 

## 2024-01-02 ENCOUNTER — Other Ambulatory Visit (HOSPITAL_COMMUNITY): Payer: Self-pay | Admitting: Family Medicine

## 2024-01-02 ENCOUNTER — Ambulatory Visit (HOSPITAL_COMMUNITY)
Admission: RE | Admit: 2024-01-02 | Discharge: 2024-01-02 | Disposition: A | Discharge status: Home / Self Care | Payer: 59 | Source: Ambulatory Visit | Attending: Family Medicine | Admitting: Family Medicine

## 2024-01-02 DIAGNOSIS — R10811 Right upper quadrant abdominal tenderness: Secondary | ICD-10-CM

## 2024-01-09 ENCOUNTER — Other Ambulatory Visit (HOSPITAL_COMMUNITY): Payer: Self-pay | Admitting: Family Medicine

## 2024-01-09 DIAGNOSIS — R10811 Right upper quadrant abdominal tenderness: Secondary | ICD-10-CM

## 2024-01-15 ENCOUNTER — Encounter (HOSPITAL_COMMUNITY)
Admission: RE | Admit: 2024-01-15 | Discharge: 2024-01-15 | Disposition: A | Discharge status: Home / Self Care | Payer: 59 | Source: Ambulatory Visit | Attending: Family Medicine | Admitting: Family Medicine

## 2024-01-15 ENCOUNTER — Encounter (HOSPITAL_COMMUNITY): Payer: Self-pay

## 2024-01-15 DIAGNOSIS — R10811 Right upper quadrant abdominal tenderness: Secondary | ICD-10-CM | POA: Diagnosis present

## 2024-01-15 MED ORDER — TECHNETIUM TC 99M MEBROFENIN IV KIT
5.0000 | PACK | Freq: Once | INTRAVENOUS | Status: AC | PRN
  Administered 2024-01-15: 4.9 via INTRAVENOUS

## 2024-07-17 ENCOUNTER — Encounter: Payer: Self-pay | Admitting: Advanced Practice Midwife
